# Patient Record
Sex: Female | Born: 1980 | Race: White | Hispanic: No | Smoking: Former smoker
Health system: Southern US, Community
[De-identification: ages and names within clinical notes are randomized; demographics above are authoritative.]

## PROBLEM LIST (undated history)

## (undated) ENCOUNTER — Inpatient Hospital Stay (HOSPITAL_COMMUNITY): Payer: Self-pay

## (undated) DIAGNOSIS — F32A Depression, unspecified: Secondary | ICD-10-CM

## (undated) DIAGNOSIS — E282 Polycystic ovarian syndrome: Secondary | ICD-10-CM

## (undated) DIAGNOSIS — F419 Anxiety disorder, unspecified: Secondary | ICD-10-CM

## (undated) DIAGNOSIS — R9089 Other abnormal findings on diagnostic imaging of central nervous system: Secondary | ICD-10-CM

## (undated) DIAGNOSIS — L409 Psoriasis, unspecified: Secondary | ICD-10-CM

## (undated) DIAGNOSIS — L719 Rosacea, unspecified: Secondary | ICD-10-CM

## (undated) DIAGNOSIS — R569 Unspecified convulsions: Secondary | ICD-10-CM

## (undated) DIAGNOSIS — G43019 Migraine without aura, intractable, without status migrainosus: Secondary | ICD-10-CM

## (undated) DIAGNOSIS — F329 Major depressive disorder, single episode, unspecified: Secondary | ICD-10-CM

## (undated) DIAGNOSIS — M797 Fibromyalgia: Secondary | ICD-10-CM

## (undated) HISTORY — DX: Other abnormal findings on diagnostic imaging of central nervous system: R90.89

## (undated) HISTORY — DX: Fibromyalgia: M79.7

## (undated) HISTORY — PX: TONSILLECTOMY: SUR1361

## (undated) HISTORY — PX: CHOLECYSTECTOMY: SHX55

## (undated) HISTORY — DX: Migraine without aura, intractable, without status migrainosus: G43.019

## (undated) HISTORY — PX: TONSILLECTOMY AND ADENOIDECTOMY: SHX28

---

## 2003-02-19 ENCOUNTER — Emergency Department (HOSPITAL_COMMUNITY): Admission: EM | Admit: 2003-02-19 | Discharge: 2003-02-19 | Payer: Self-pay | Admitting: Emergency Medicine

## 2003-08-10 ENCOUNTER — Emergency Department (HOSPITAL_COMMUNITY): Admission: EM | Admit: 2003-08-10 | Discharge: 2003-08-11 | Payer: Self-pay | Admitting: *Deleted

## 2004-05-22 ENCOUNTER — Emergency Department (HOSPITAL_COMMUNITY): Admission: EM | Admit: 2004-05-22 | Discharge: 2004-05-22 | Payer: Self-pay | Admitting: Emergency Medicine

## 2004-11-05 ENCOUNTER — Emergency Department (HOSPITAL_COMMUNITY): Admission: EM | Admit: 2004-11-05 | Discharge: 2004-11-05 | Payer: Self-pay | Admitting: Emergency Medicine

## 2004-11-25 ENCOUNTER — Emergency Department (HOSPITAL_COMMUNITY): Admission: EM | Admit: 2004-11-25 | Discharge: 2004-11-26 | Payer: Self-pay | Admitting: *Deleted

## 2004-12-01 ENCOUNTER — Emergency Department: Payer: Self-pay | Admitting: Unknown Physician Specialty

## 2005-01-02 ENCOUNTER — Emergency Department: Payer: Self-pay | Admitting: Emergency Medicine

## 2005-07-04 ENCOUNTER — Ambulatory Visit (HOSPITAL_COMMUNITY): Admission: RE | Admit: 2005-07-04 | Discharge: 2005-07-04 | Payer: Self-pay | Admitting: Neurology

## 2006-05-09 ENCOUNTER — Other Ambulatory Visit: Admission: RE | Admit: 2006-05-09 | Discharge: 2006-05-09 | Payer: Self-pay | Admitting: Obstetrics and Gynecology

## 2006-05-23 ENCOUNTER — Inpatient Hospital Stay (HOSPITAL_COMMUNITY): Admission: AD | Admit: 2006-05-23 | Discharge: 2006-05-23 | Payer: Self-pay | Admitting: Obstetrics and Gynecology

## 2006-07-19 ENCOUNTER — Inpatient Hospital Stay (HOSPITAL_COMMUNITY): Admission: AD | Admit: 2006-07-19 | Discharge: 2006-07-19 | Payer: Self-pay | Admitting: Obstetrics and Gynecology

## 2006-08-10 ENCOUNTER — Inpatient Hospital Stay (HOSPITAL_COMMUNITY): Admission: AD | Admit: 2006-08-10 | Discharge: 2006-08-10 | Payer: Self-pay | Admitting: Obstetrics and Gynecology

## 2006-10-06 ENCOUNTER — Inpatient Hospital Stay (HOSPITAL_COMMUNITY): Admission: AD | Admit: 2006-10-06 | Discharge: 2006-10-06 | Payer: Self-pay | Admitting: Obstetrics and Gynecology

## 2007-06-19 ENCOUNTER — Emergency Department: Payer: Self-pay | Admitting: Emergency Medicine

## 2007-12-27 ENCOUNTER — Emergency Department: Payer: Self-pay | Admitting: Emergency Medicine

## 2008-01-22 ENCOUNTER — Emergency Department: Payer: Self-pay | Admitting: Emergency Medicine

## 2008-03-05 ENCOUNTER — Emergency Department: Payer: Self-pay | Admitting: Emergency Medicine

## 2008-12-21 ENCOUNTER — Emergency Department (HOSPITAL_COMMUNITY): Admission: EM | Admit: 2008-12-21 | Discharge: 2008-12-21 | Payer: Self-pay | Admitting: Emergency Medicine

## 2009-03-28 ENCOUNTER — Emergency Department (HOSPITAL_COMMUNITY): Admission: EM | Admit: 2009-03-28 | Discharge: 2009-03-28 | Payer: Self-pay | Admitting: *Deleted

## 2009-07-20 ENCOUNTER — Emergency Department: Payer: Self-pay | Admitting: Emergency Medicine

## 2009-07-31 ENCOUNTER — Emergency Department: Payer: Self-pay | Admitting: Emergency Medicine

## 2009-12-09 ENCOUNTER — Emergency Department: Payer: Self-pay | Admitting: Emergency Medicine

## 2010-04-07 ENCOUNTER — Emergency Department: Payer: Self-pay | Admitting: Emergency Medicine

## 2010-10-21 ENCOUNTER — Emergency Department: Payer: Self-pay | Admitting: Unknown Physician Specialty

## 2010-12-26 ENCOUNTER — Emergency Department: Payer: Self-pay | Admitting: Unknown Physician Specialty

## 2011-01-12 ENCOUNTER — Emergency Department: Payer: Self-pay | Admitting: Emergency Medicine

## 2011-03-13 LAB — POCT I-STAT, CHEM 8
Calcium, Ion: 1.22 mmol/L (ref 1.12–1.32)
Creatinine, Ser: 0.8 mg/dL (ref 0.4–1.2)
Hemoglobin: 13.9 g/dL (ref 12.0–15.0)
TCO2: 26 mmol/L (ref 0–100)

## 2011-03-13 LAB — CBC
HCT: 39.3 % (ref 36.0–46.0)
Hemoglobin: 13.6 g/dL (ref 12.0–15.0)
MCHC: 34.8 g/dL (ref 30.0–36.0)
RBC: 4.57 MIL/uL (ref 3.87–5.11)
RDW: 13.4 % (ref 11.5–15.5)
WBC: 11 10*3/uL — ABNORMAL HIGH (ref 4.0–10.5)

## 2011-03-13 LAB — DIFFERENTIAL
Basophils Relative: 0 % (ref 0–1)
Lymphocytes Relative: 38 % (ref 12–46)
Monocytes Relative: 5 % (ref 3–12)
Neutro Abs: 5.9 10*3/uL (ref 1.7–7.7)

## 2011-03-13 LAB — POCT PREGNANCY, URINE: Preg Test, Ur: NEGATIVE

## 2011-03-18 LAB — RAPID STREP SCREEN (MED CTR MEBANE ONLY): Streptococcus, Group A Screen (Direct): NEGATIVE

## 2011-08-16 ENCOUNTER — Other Ambulatory Visit (HOSPITAL_COMMUNITY): Payer: Self-pay | Admitting: Family Medicine

## 2011-08-16 ENCOUNTER — Ambulatory Visit (HOSPITAL_COMMUNITY)
Admission: RE | Admit: 2011-08-16 | Discharge: 2011-08-16 | Disposition: A | Discharge status: Home / Self Care | Payer: Medicaid Other | Source: Ambulatory Visit | Attending: Family Medicine | Admitting: Family Medicine

## 2011-08-16 DIAGNOSIS — R52 Pain, unspecified: Secondary | ICD-10-CM

## 2011-08-16 DIAGNOSIS — M545 Low back pain, unspecified: Secondary | ICD-10-CM | POA: Insufficient documentation

## 2011-08-16 DIAGNOSIS — M79609 Pain in unspecified limb: Secondary | ICD-10-CM | POA: Insufficient documentation

## 2011-09-27 ENCOUNTER — Ambulatory Visit: Payer: Self-pay | Admitting: Internal Medicine

## 2011-12-05 ENCOUNTER — Encounter: Payer: Self-pay | Admitting: Emergency Medicine

## 2011-12-05 ENCOUNTER — Emergency Department (HOSPITAL_COMMUNITY)
Admission: EM | Admit: 2011-12-05 | Discharge: 2011-12-05 | Disposition: A | Discharge status: Home / Self Care | Payer: Medicaid Other | Source: Home / Self Care | Attending: Family Medicine | Admitting: Family Medicine

## 2011-12-05 DIAGNOSIS — N760 Acute vaginitis: Secondary | ICD-10-CM

## 2011-12-05 HISTORY — DX: Polycystic ovarian syndrome: E28.2

## 2011-12-05 MED ORDER — FLUCONAZOLE 150 MG PO TABS: 150.0000 mg | ORAL_TABLET | Freq: Once | ORAL | Status: AC

## 2011-12-05 MED ORDER — NYSTATIN-TRIAMCINOLONE 100000-0.1 UNIT/GM-% EX CREA: TOPICAL_CREAM | CUTANEOUS | Status: DC

## 2011-12-05 NOTE — ED Notes (Signed)
Patient reports going to dermatologist tomorrow, encouraged speaking to dermatologist about these skin issues

## 2011-12-05 NOTE — ED Provider Notes (Signed)
History     CSN: [id number]  Arrival date & time 12/05/11  0906   First MD Initiated Contact with Patient 12/05/11 [phone]      Chief Complaint  Patient presents with  . Rash    (Consider location/radiation/quality/duration/timing/severity/associated sxs/prior treatment) HPI Comments: Andrea [name] presents for evaluation of irritated skin over her vagina and genital area. She reports using a new brand of feminine wash recently that was perfumed. She reports normally sensitive skin, all over. She reports itching, swelling, pain, and redness over her vagina, perineum. She has used several over the counter preparations, except steroids, without relief.   Patient is a 31 y.o. female presenting with vaginal itching. The history is provided by the patient.  Vaginal Itching This is a new problem. The current episode started more than 1 week ago. The problem occurs constantly. The problem has been gradually worsening. The symptoms are aggravated by nothing. The symptoms are relieved by nothing. The treatment provided no relief.    Past Medical History  Diagnosis Date  . Polycystic ovarian disease     Past Surgical History  Procedure Date  . Tonsillectomy   . Tonsillectomy and adenoidectomy   . Cholecystectomy     No family history on file.  History  Substance Use Topics  . Smoking status: Never Smoker   . Smokeless tobacco: Not on file  . Alcohol Use: No    OB History    Grav Para Term Preterm Abortions TAB SAB Ect Mult Living                  Review of Systems  Constitutional: Negative.   HENT: Negative.   Eyes: Negative.   Respiratory: Negative.   Cardiovascular: Negative.   Gastrointestinal: Negative.   Genitourinary: Positive for vaginal pain. Negative for dysuria, vaginal bleeding and vaginal discharge.  Musculoskeletal: Negative.   Skin: Positive for rash.  Neurological: Negative.     Allergies  Flagyl  Home Medications   Current Outpatient Rx  Name Route Sig  Dispense Refill  . BENZOCAINE-RESORCINOL 5-2 % VA CREA Vaginal Place vaginally at bedtime.      Marland Kitchen BIOTIN PO Oral Take by mouth.      Maryfrances Bunnell EX Apply externally Apply topically.      Marland Kitchen METFORMIN HCL 1000 MG PO TABS Oral Take 1,000 mg by mouth 2 (two) times daily with a meal.      . NYSTATIN 100000 UNIT/GM EX CREA Topical Apply topically 2 (two) times daily.      Marland Kitchen SPIRONOLACTONE 100 MG PO TABS Oral Take 100 mg by mouth 2 (two) times daily.      Marland Kitchen FLUCONAZOLE 150 MG PO TABS Oral Take 1 tablet (150 mg total) by mouth once. Take one pill once. May repeat if symptoms persist after 3rd day. 2 tablet 0  . NYSTATIN-TRIAMCINOLONE 100000-0.1 UNIT/GM-% EX CREA  Apply to affected area daily 60 g 0    BP 125/58  Pulse 87  Temp(Src) 98.2 F (36.8 C) (Oral)  Resp 20  SpO2 100%  LMP 12/05/2011  Physical Exam  Nursing note and vitals reviewed. Constitutional: She is oriented to person, place, and time. She appears well-developed and well-nourished.  HENT:  Head: Normocephalic and atraumatic.  Eyes: EOM are normal.  Neck: Normal range of motion.  Pulmonary/Chest: Effort normal.  Genitourinary: Vagina normal. Pelvic exam was performed with patient supine. There is rash and tenderness on the right labia. There is rash and tenderness on the left labia.  Erythema, swelling, tenderness to palpation over vaginal labia, inguinal region, and perineum  Musculoskeletal: Normal range of motion.  Neurological: She is alert and oriented to person, place, and time.  Skin: Skin is warm and dry.  Psychiatric: Her behavior is normal.    ED Course  Procedures (including critical care time)  Labs Reviewed - No data to display No results found.   1. Vaginitis and vulvovaginitis       MDM  Treated with Mycolog II cream.        Richardo Priest, MD 12/05/11 1054

## 2011-12-05 NOTE — ED Notes (Signed)
C/o red, irritated vaginal, labia area.  Changed soaps recently, 2 weeks ago.  Symptoms started one week ago

## 2011-12-06 ENCOUNTER — Telehealth (HOSPITAL_COMMUNITY): Payer: Self-pay | Admitting: *Deleted

## 2011-12-06 NOTE — ED Notes (Signed)
[facility] pharmacist called on VM @ [phone] with question about the patients Rx. I called back and spoke to [facility].  She said pt. has an allergy to Metronidazole that causes swelling to her body and face. She said it is in the same family as Fluconazole and asked what Dr. Juanetta Gosling wanted to change it to? I told her I would call back.  Discussed with Dr. Juanetta Gosling and he said he had never heard of this but would research it. He said tell the pharmacist to cancel the Fluconazole Rx. and have pt. just use the topical Mycalog Cream. 1300  I called the pharmacist Melissa back and gave her this order to cancel.  She said she will tell the pt. Andrea [name] 12/06/2011  12/06/2011

## 2012-07-11 ENCOUNTER — Encounter (HOSPITAL_COMMUNITY): Payer: Self-pay

## 2012-07-11 ENCOUNTER — Emergency Department (HOSPITAL_COMMUNITY)
Admission: EM | Admit: 2012-07-11 | Discharge: 2012-07-11 | Disposition: A | Discharge status: Home / Self Care | Payer: Self-pay | Attending: Emergency Medicine | Admitting: Emergency Medicine

## 2012-07-11 DIAGNOSIS — E282 Polycystic ovarian syndrome: Secondary | ICD-10-CM | POA: Insufficient documentation

## 2012-07-11 DIAGNOSIS — N390 Urinary tract infection, site not specified: Secondary | ICD-10-CM | POA: Insufficient documentation

## 2012-07-11 LAB — URINALYSIS, ROUTINE W REFLEX MICROSCOPIC
Bilirubin Urine: NEGATIVE
Ketones, ur: NEGATIVE mg/dL
Specific Gravity, Urine: 1.026 (ref 1.005–1.030)

## 2012-07-11 LAB — POCT I-STAT, CHEM 8
Calcium, Ion: 1.23 mmol/L (ref 1.12–1.23)
Chloride: 103 mEq/L (ref 96–112)
Creatinine, Ser: 0.8 mg/dL (ref 0.50–1.10)
Glucose, Bld: 96 mg/dL (ref 70–99)
HCT: 41 % (ref 36.0–46.0)

## 2012-07-11 LAB — PREGNANCY, URINE: Preg Test, Ur: NEGATIVE

## 2012-07-11 LAB — URINE MICROSCOPIC-ADD ON

## 2012-07-11 LAB — WET PREP, GENITAL
Trich, Wet Prep: NONE SEEN
Yeast Wet Prep HPF POC: NONE SEEN

## 2012-07-11 MED ORDER — CEPHALEXIN 500 MG PO CAPS: 500.0000 mg | ORAL_CAPSULE | Freq: Two times a day (BID) | ORAL | Status: AC

## 2012-07-11 NOTE — ED Notes (Signed)
Pt with c/o 4 days of pelvic pain, constant sharp pain, just finished period, straining to void

## 2012-07-11 NOTE — ED Provider Notes (Signed)
History     CSN: [id number]  Arrival date & time 07/11/12  0902   First MD Initiated Contact with Patient 07/11/12 [phone]      Chief Complaint  Patient presents with  . Pelvic Pain    (Consider location/radiation/quality/duration/timing/severity/associated sxs/prior treatment) Patient is a 31 y.o. female presenting with pelvic pain.  Pelvic Pain Pertinent negatives include no fever or rash.    ROZELL KETTLEWELL is a 31 y.o. female in no acute distress complaining of pelvic pain worsening over the course of 4 days patient describes the pain as sharp 4/10 and worsened with urination. Patient denies any vaginal discharge, fever, nausea,vomiting, dysuria or frequency or rash. Episode is consistent with prior exacerbations of urinary tract infections.    Past Medical History  Diagnosis Date  . Polycystic ovarian disease     Past Surgical History  Procedure Date  . Tonsillectomy   . Tonsillectomy and adenoidectomy   . Cholecystectomy     History reviewed. No pertinent family history.  History  Substance Use Topics  . Smoking status: Never Smoker   . Smokeless tobacco: Not on file  . Alcohol Use: No    OB History    Grav Para Term Preterm Abortions TAB SAB Ect Mult Living                  Review of Systems  Constitutional: Negative for fever.  Genitourinary: Positive for vaginal pain and pelvic pain. Negative for dysuria, urgency, frequency, flank pain and vaginal discharge.  Skin: Negative for rash.  All other systems reviewed and are negative.    Allergies  Flagyl  Home Medications   Current Outpatient Rx  Name Route Sig Dispense Refill  . ACETAMINOPHEN 500 MG PO TABS Oral Take 500 mg by mouth every 6 (six) hours as needed. For pain    . IBUPROFEN 200 MG PO TABS Oral Take 200 mg by mouth every 6 (six) hours as needed. For pain    . METFORMIN HCL 1000 MG PO TABS Oral Take 1,000 mg by mouth 2 (two) times daily with a meal.      . SPIRONOLACTONE 100 MG PO  TABS Oral Take 100 mg by mouth 2 (two) times daily.      . CEPHALEXIN 500 MG PO CAPS Oral Take 1 capsule (500 mg total) by mouth 2 (two) times daily. 20 capsule 0    BP 118/63  Pulse 82  Temp 98.1 F (36.7 C) (Oral)  Resp 16  SpO2 99%  LMP 06/15/2012  Physical Exam  Nursing note and vitals reviewed. Constitutional: She is oriented to person, place, and time. She appears well-developed and well-nourished. No distress.  HENT:  Head: Normocephalic.  Eyes: Conjunctivae and EOM are normal.  Cardiovascular: Normal rate.   Pulmonary/Chest: Effort normal.  Abdominal: Soft. Bowel sounds are normal. She exhibits no distension and no mass. There is no tenderness. There is no rebound and no guarding.  Genitourinary: Vagina normal. Uterus is not enlarged. Cervix exhibits discharge. Cervix exhibits no motion tenderness and no friability. Right adnexum displays no mass, no tenderness and no fullness. Left adnexum displays no mass, no tenderness and no fullness. No vaginal discharge found.       Chaperone present during pelvic exam. No cervical motion tenderness. Thick white cervical discharge.   Musculoskeletal: Normal range of motion.  Neurological: She is alert and oriented to person, place, and time.  Psychiatric: She has a normal mood and affect.    ED Course  Procedures (including critical care time)  Labs Reviewed  URINALYSIS, ROUTINE W REFLEX MICROSCOPIC - Abnormal; Notable for the following:    Leukocytes, UA SMALL (*)     All other components within normal limits  URINE MICROSCOPIC-ADD ON - Abnormal; Notable for the following:    Squamous Epithelial / LPF FEW (*)     All other components within normal limits  PREGNANCY, URINE  POCT PREGNANCY, URINE  POCT I-STAT, CHEM 8  GC/CHLAMYDIA PROBE AMP, GENITAL  WET PREP, GENITAL   No results found.   1. UTI (lower urinary tract infection)       MDM  31 year old female with pelvic pain consistent with prior episodes of UTI. UA  shows sites I will treat with Keflex and culture her urine.        Wynetta Emery, PA-C 07/11/12 1037

## 2012-07-12 NOTE — ED Provider Notes (Signed)
Medical screening examination/treatment/procedure(s) were performed by non-physician practitioner and as supervising physician I was immediately available for consultation/collaboration.  Madai Nuccio, MD 07/12/12 1724 

## 2013-05-18 ENCOUNTER — Emergency Department: Payer: Self-pay | Admitting: Emergency Medicine

## 2013-12-02 ENCOUNTER — Emergency Department (HOSPITAL_COMMUNITY)
Admission: EM | Admit: 2013-12-02 | Discharge: 2013-12-02 | Disposition: A | Discharge status: Home / Self Care | Payer: Medicaid Other | Attending: Emergency Medicine | Admitting: Emergency Medicine

## 2013-12-02 ENCOUNTER — Encounter (HOSPITAL_COMMUNITY): Payer: Self-pay | Admitting: Emergency Medicine

## 2013-12-02 DIAGNOSIS — Z79899 Other long term (current) drug therapy: Secondary | ICD-10-CM | POA: Insufficient documentation

## 2013-12-02 DIAGNOSIS — N760 Acute vaginitis: Secondary | ICD-10-CM | POA: Insufficient documentation

## 2013-12-02 LAB — WET PREP, GENITAL
CLUE CELLS WET PREP: NONE SEEN
TRICH WET PREP: NONE SEEN
Yeast Wet Prep HPF POC: NONE SEEN

## 2013-12-02 MED ORDER — FLUCONAZOLE 100 MG PO TABS
150.0000 mg | ORAL_TABLET | Freq: Once | ORAL | Status: DC
  Filled 2013-12-02: qty 2

## 2013-12-02 MED ORDER — FLUCONAZOLE 100 MG PO TABS
200.0000 mg | ORAL_TABLET | Freq: Once | ORAL | Status: AC
  Administered 2013-12-02: 200 mg via ORAL

## 2013-12-02 NOTE — Discharge Instructions (Signed)
You were treated in the emergency department with a tablet of Diflucan for what I believe to be a yeast-type infection. You may also use one of the Vagisil anti-itch preparations, or Vaseline petroleum jelly to assist with your discomfort. Please return to the emergency department, or see your primary physician if not improving. Your cultures were submitted to the lab, and someone from our flow managers office will contact you if any abnormalities present. Vaginitis Vaginitis is an inflammation of the vagina. It is most often caused by a change in the normal balance of the bacteria and yeast that live in the vagina. This change in balance causes an overgrowth of certain bacteria or yeast, which causes the inflammation. There are different types of vaginitis, but the most common types are:  Bacterial vaginosis.  Yeast infection (candidiasis).  Trichomoniasis vaginitis. This is a sexually transmitted infection (STI).  Viral vaginitis.  Atropic vaginitis.  Allergic vaginitis. CAUSES  The cause depends on the type of vaginitis. Vaginitis can be caused by:  Bacteria (bacterial vaginosis).  Yeast (yeast infection).  A parasite (trichomoniasis vaginitis)  A virus (viral vaginitis).  Low hormone levels (atrophic vaginitis). Low hormone levels can occur during pregnancy, breastfeeding, or after menopause.  Irritants, such as bubble baths, scented tampons, and feminine sprays (allergic vaginitis). Other factors can change the normal balance of the yeast and bacteria that live in the vagina. These include:  Antibiotic medicines.  Poor hygiene.  Diaphragms, vaginal sponges, spermicides, birth control pills, and intrauterine devices (IUD).  Sexual intercourse.  Infection.  Uncontrolled diabetes.  A weakened immune system. SYMPTOMS  Symptoms can vary depending on the cause of the vaginitis. Common symptoms include:  Abnormal vaginal discharge.  The discharge is white, gray, or  yellow with bacterial vaginosis.  The discharge is thick, white, and cheesy with a yeast infection.  The discharge is frothy and yellow or greenish with trichomoniasis.  A bad vaginal odor.  The odor is fishy with bacterial vaginosis.  Vaginal itching, pain, or swelling.  Painful intercourse.  Pain or burning when urinating. Sometimes, there are no symptoms. TREATMENT  Treatment will vary depending on the type of infection.   Bacterial vaginosis and trichomoniasis are often treated with antibiotic creams or pills.  Yeast infections are often treated with antifungal medicines, such as vaginal creams or suppositories.  Viral vaginitis has no cure, but symptoms can be treated with medicines that relieve discomfort. Your sexual partner should be treated as well.  Atrophic vaginitis may be treated with an estrogen cream, pill, suppository, or vaginal ring. If vaginal dryness occurs, lubricants and moisturizing creams may help. You may be told to avoid scented soaps, sprays, or douches.  Allergic vaginitis treatment involves quitting the use of the product that is causing the problem. Vaginal creams can be used to treat the symptoms. HOME CARE INSTRUCTIONS   Take all medicines as directed by your caregiver.  Keep your genital area clean and dry. Avoid soap and only rinse the area with water.  Avoid douching. It can remove the healthy bacteria in the vagina.  Do not use tampons or have sexual intercourse until your vaginitis has been treated. Use sanitary pads while you have vaginitis.  Wipe from front to back. This avoids the spread of bacteria from the rectum to the vagina.  Let air reach your genital area.  Wear cotton underwear to decrease moisture buildup.  Avoid wearing underwear while you sleep until your vaginitis is gone.  Avoid tight pants and underwear or  nylons without a cotton panel.  Take off wet clothing (especially bathing suits) as soon as possible.  Use  mild, non-scented products. Avoid using irritants, such as:  Scented feminine sprays.  Fabric softeners.  Scented detergents.  Scented tampons.  Scented soaps or bubble baths.  Practice safe sex and use condoms. Condoms may prevent the spread of trichomoniasis and viral vaginitis. SEEK MEDICAL CARE IF:   You have abdominal pain.  You have a fever or persistent symptoms for more than 2 3 days.  You have a fever and your symptoms suddenly get worse. Document Released: 09/15/2007 Document Revised: 08/12/2012 Document Reviewed: 04/30/2012 [facility] Patient Information 2014 [place], [place].

## 2013-12-02 NOTE — ED Notes (Signed)
Patient complaining of vaginal itching and swelling starting today. States "My husband just left me for someone else and I want to be checked for everything." Patient has history of yeast infections.

## 2013-12-02 NOTE — ED Provider Notes (Signed)
CSN: [id number]     Arrival date & time 12/02/13  1537 History   First MD Initiated Contact with Patient 12/02/13 1609     Chief Complaint  Patient presents with  . Vaginal Itching   (Consider location/radiation/quality/duration/timing/severity/associated sxs/prior Treatment) Patient is a 33 y.o. female presenting with vaginal itching. The history is provided by the patient.  Vaginal Itching This is a new problem. The current episode started in the past 7 days. The problem occurs intermittently. The problem has been gradually worsening. Pertinent negatives include no abdominal pain, arthralgias, chest pain, coughing, neck pain, swollen glands or vomiting. Nothing aggravates the symptoms. She has tried nothing for the symptoms. The treatment provided moderate relief.    Past Medical History  Diagnosis Date  . Polycystic ovarian disease    Past Surgical History  Procedure Laterality Date  . Tonsillectomy    . Tonsillectomy and adenoidectomy    . Cholecystectomy     History reviewed. No pertinent family history. History  Substance Use Topics  . Smoking status: Never Smoker   . Smokeless tobacco: Not on file  . Alcohol Use: Yes     Comment: occasionally   OB History   Grav Para Term Preterm Abortions TAB SAB Ect Mult Living                 Review of Systems  Constitutional: Negative for activity change.       All ROS Neg except as noted in HPI  HENT: Negative for nosebleeds.   Eyes: Negative for photophobia and discharge.  Respiratory: Negative for cough, shortness of breath and wheezing.   Cardiovascular: Negative for chest pain and palpitations.  Gastrointestinal: Negative for vomiting, abdominal pain and blood in stool.  Genitourinary: Negative for dysuria, frequency and hematuria.  Musculoskeletal: Negative for arthralgias, back pain and neck pain.  Skin: Negative.   Neurological: Negative for dizziness, seizures and speech difficulty.  Psychiatric/Behavioral: Negative  for hallucinations and confusion.    Allergies  Flagyl  Home Medications   Current Outpatient Rx  Name  Route  Sig  Dispense  Refill  . acetaminophen (TYLENOL) 500 MG tablet   Oral   Take 500 mg by mouth every 6 (six) hours as needed. For pain         . ibuprofen (ADVIL,MOTRIN) 200 MG tablet   Oral   Take 200 mg by mouth every 6 (six) hours as needed. For pain         . metFORMIN (GLUCOPHAGE) 1000 MG tablet   Oral   Take 1,000 mg by mouth 2 (two) times daily with a meal.           . spironolactone (ALDACTONE) 100 MG tablet   Oral   Take 100 mg by mouth 2 (two) times daily.            BP 134/84  Pulse 80  Temp(Src) 98.2 F (36.8 C) (Oral)  Resp 16  Ht 5\' 7"  (1.702 m)  Wt 263 lb (119.296 kg)  BMI 41.18 kg/m2  SpO2 100%  LMP 10/21/2013 Physical Exam  Nursing note and vitals reviewed. Constitutional: She is oriented to person, place, and time. She appears well-developed and well-nourished.  Non-toxic appearance.  HENT:  Head: Normocephalic.  Right Ear: Tympanic membrane and external ear normal.  Left Ear: Tympanic membrane and external ear normal.  Eyes: EOM and lids are normal. Pupils are equal, round, and reactive to light.  Neck: Normal range of motion. Neck supple. Carotid bruit is  not present.  Cardiovascular: Normal rate, regular rhythm, normal heart sounds, intact distal pulses and normal pulses.   Pulmonary/Chest: Breath sounds normal. No respiratory distress.  Abdominal: Soft. Bowel sounds are normal. There is no tenderness. There is no guarding. Hernia confirmed negative in the right inguinal area and confirmed negative in the left inguinal area.  Genitourinary: There is tenderness on the right labia. There is no lesion or injury on the right labia. There is tenderness on the left labia. There is no lesion or injury on the left labia. There is erythema around the vagina. No bleeding around the vagina. No foreign body around the vagina. No signs of injury  around the vagina. Vaginal discharge found.  Mild to mod white vaginal discharge. Increase redness and swelling of the internal and external labia.  Musculoskeletal: Normal range of motion.  Lymphadenopathy:       Head (right side): No submandibular adenopathy present.       Head (left side): No submandibular adenopathy present.    She has no cervical adenopathy.       Right: No inguinal adenopathy present.       Left: No inguinal adenopathy present.  Neurological: She is alert and oriented to person, place, and time. She has normal strength. No cranial nerve deficit or sensory deficit.  Skin: Skin is warm and dry.  Psychiatric: She has a normal mood and affect. Her speech is normal.    ED Course  Procedures (including critical care time) Labs Review Labs Reviewed - No data to display Imaging Review No results found.  EKG Interpretation   None       MDM  No diagnosis found. **I have reviewed nursing notes, vital signs, and all appropriate lab and imaging results for this patient.* RPR and gonorrhea Chlamydia tests have been obtained and sent to the lab.  Wet prep reveals no yeast, trichomonas, or clue cells present.  Patient treated with oral Diflucan for itching symptoms and findings on examination. Patient invited to try Vagisil anti-itch products, or Vaseline petroleum jelly for assistance with the dryness and itching. Patient is to see her primary physician, or return to the emergency department if not improving.  Kathie Dike, PA-C 12/02/13 1744

## 2013-12-03 LAB — RPR: RPR Ser Ql: NONREACTIVE

## 2013-12-05 LAB — GC/CHLAMYDIA PROBE AMP
CT Probe RNA: NEGATIVE
GC Probe RNA: NEGATIVE

## 2013-12-06 ENCOUNTER — Emergency Department: Payer: Self-pay | Admitting: Emergency Medicine

## 2013-12-06 LAB — URINALYSIS, COMPLETE
BACTERIA: NONE SEEN
Bilirubin,UR: NEGATIVE
Blood: NEGATIVE
GLUCOSE, UR: NEGATIVE mg/dL (ref 0–75)
Ketone: NEGATIVE
LEUKOCYTE ESTERASE: NEGATIVE
Nitrite: NEGATIVE
PROTEIN: NEGATIVE
Ph: 7 (ref 4.5–8.0)
SPECIFIC GRAVITY: 1.015 (ref 1.003–1.030)
Squamous Epithelial: 3

## 2013-12-06 LAB — GC/CHLAMYDIA PROBE AMP

## 2013-12-06 LAB — WET PREP, GENITAL

## 2013-12-07 NOTE — ED Provider Notes (Signed)
History/physical exam/procedure(s) were performed by non-physician practitioner and as supervising physician I was immediately available for consultation/collaboration. I have reviewed all notes and am in agreement with care and plan.   Challis Crill S Keidan Aumiller, MD 12/07/13 1511 

## 2014-04-29 ENCOUNTER — Emergency Department: Payer: Self-pay | Admitting: Emergency Medicine

## 2014-04-29 LAB — URINALYSIS, COMPLETE
BACTERIA: NONE SEEN
BILIRUBIN, UR: NEGATIVE
BLOOD: NEGATIVE
GLUCOSE, UR: NEGATIVE mg/dL (ref 0–75)
Leukocyte Esterase: NEGATIVE
NITRITE: NEGATIVE
PROTEIN: NEGATIVE
Ph: 5 (ref 4.5–8.0)
RBC,UR: 1 /HPF (ref 0–5)
SPECIFIC GRAVITY: 1.029 (ref 1.003–1.030)
Squamous Epithelial: <1

## 2014-05-10 ENCOUNTER — Ambulatory Visit: Payer: Self-pay | Admitting: Physician Assistant

## 2014-06-06 ENCOUNTER — Ambulatory Visit: Payer: Self-pay | Admitting: Internal Medicine

## 2015-01-09 ENCOUNTER — Ambulatory Visit: Payer: Self-pay

## 2015-03-14 ENCOUNTER — Emergency Department: Admit: 2015-03-14 | Disposition: A | Discharge status: Home / Self Care | Payer: Self-pay | Admitting: Student

## 2015-03-18 ENCOUNTER — Ambulatory Visit
Admit: 2015-03-18 | Disposition: A | Discharge status: Home / Self Care | Payer: Self-pay | Attending: Orthopedic Surgery | Admitting: Orthopedic Surgery

## 2015-03-18 ENCOUNTER — Ambulatory Visit: Admit: 2015-03-18 | Disposition: A | Discharge status: Home / Self Care | Payer: Self-pay

## 2015-04-26 ENCOUNTER — Ambulatory Visit: Payer: Medicaid Other | Admitting: Anesthesiology

## 2015-10-04 ENCOUNTER — Emergency Department: Payer: Medicaid Other

## 2015-10-04 ENCOUNTER — Emergency Department
Admission: EM | Admit: 2015-10-04 | Discharge: 2015-10-04 | Disposition: A | Discharge status: Home / Self Care | Payer: Medicaid Other | Attending: Emergency Medicine | Admitting: Emergency Medicine

## 2015-10-04 ENCOUNTER — Encounter: Payer: Self-pay | Admitting: Intensive Care

## 2015-10-04 DIAGNOSIS — Z87891 Personal history of nicotine dependence: Secondary | ICD-10-CM | POA: Insufficient documentation

## 2015-10-04 DIAGNOSIS — Z791 Long term (current) use of non-steroidal anti-inflammatories (NSAID): Secondary | ICD-10-CM | POA: Insufficient documentation

## 2015-10-04 DIAGNOSIS — Z331 Pregnant state, incidental: Secondary | ICD-10-CM

## 2015-10-04 DIAGNOSIS — Z3A01 Less than 8 weeks gestation of pregnancy: Secondary | ICD-10-CM | POA: Diagnosis not present

## 2015-10-04 DIAGNOSIS — O210 Mild hyperemesis gravidarum: Secondary | ICD-10-CM | POA: Insufficient documentation

## 2015-10-04 DIAGNOSIS — Z3490 Encounter for supervision of normal pregnancy, unspecified, unspecified trimester: Secondary | ICD-10-CM

## 2015-10-04 DIAGNOSIS — Z79899 Other long term (current) drug therapy: Secondary | ICD-10-CM | POA: Insufficient documentation

## 2015-10-04 DIAGNOSIS — R112 Nausea with vomiting, unspecified: Secondary | ICD-10-CM

## 2015-10-04 LAB — COMPREHENSIVE METABOLIC PANEL
ALT: 16 U/L (ref 14–54)
AST: 19 U/L (ref 15–41)
Albumin: 4.5 g/dL (ref 3.5–5.0)
Alkaline Phosphatase: 63 U/L (ref 38–126)
Anion gap: 10 (ref 5–15)
BUN: 15 mg/dL (ref 6–20)
CO2: 25 mmol/L (ref 22–32)
Calcium: 9.6 mg/dL (ref 8.9–10.3)
Chloride: 104 mmol/L (ref 101–111)
Creatinine, Ser: 0.67 mg/dL (ref 0.44–1.00)
GFR calc Af Amer: >60 mL/min (ref >60)
GFR calc non Af Amer: >60 mL/min (ref >60)
Glucose, Bld: 91 mg/dL (ref 65–99)
Potassium: 3.8 mmol/L (ref 3.5–5.1)
Sodium: 139 mmol/L (ref 135–145)
Total Bilirubin: 0.7 mg/dL (ref 0.3–1.2)
Total Protein: 7.3 g/dL (ref 6.5–8.1)

## 2015-10-04 LAB — URINALYSIS COMPLETE WITH MICROSCOPIC (ARMC ONLY)
BACTERIA UA: NONE SEEN
Bilirubin Urine: NEGATIVE
Glucose, UA: NEGATIVE mg/dL
Hgb urine dipstick: NEGATIVE
Leukocytes, UA: NEGATIVE
Nitrite: NEGATIVE
PH: 6 (ref 5.0–8.0)
PROTEIN: NEGATIVE mg/dL
RBC / HPF: NONE SEEN RBC/hpf (ref 0–5)
Specific Gravity, Urine: 1.025 (ref 1.005–1.030)

## 2015-10-04 LAB — POCT PREGNANCY, URINE: Preg Test, Ur: POSITIVE — AB

## 2015-10-04 LAB — CBC
HCT: 43.7 % (ref 35.0–47.0)
Hemoglobin: 14.9 g/dL (ref 12.0–16.0)
MCH: 29.8 pg (ref 26.0–34.0)
MCHC: 34 g/dL (ref 32.0–36.0)
MCV: 87.6 fL (ref 80.0–100.0)
Platelets: 225 10*3/uL (ref 150–440)
RBC: 4.99 MIL/uL (ref 3.80–5.20)
RDW: 12.6 % (ref 11.5–14.5)
WBC: 8.7 10*3/uL (ref 3.6–11.0)

## 2015-10-04 LAB — HCG, QUANTITATIVE, PREGNANCY: hCG, Beta Chain, Quant, S: 51599 m[IU]/mL — ABNORMAL HIGH (ref <5)

## 2015-10-04 LAB — WET PREP, GENITAL
Clue Cells Wet Prep HPF POC: NONE SEEN
Trich, Wet Prep: NONE SEEN
Yeast Wet Prep HPF POC: NONE SEEN

## 2015-10-04 LAB — CHLAMYDIA/NGC RT PCR (ARMC ONLY)
Chlamydia Tr: NOT DETECTED
N GONORRHOEAE: NOT DETECTED

## 2015-10-04 LAB — LIPASE, BLOOD: Lipase: 26 U/L (ref 11–51)

## 2015-10-04 MED ORDER — ONDANSETRON HCL 4 MG/2ML IJ SOLN
4.0000 mg | Freq: Once | INTRAMUSCULAR | Status: AC
  Administered 2015-10-04: 4 mg via INTRAVENOUS

## 2015-10-04 MED ORDER — PROMETHAZINE HCL 25 MG/ML IJ SOLN: 25.0000 mg | Freq: Once | INTRAMUSCULAR | Status: DC

## 2015-10-04 MED ORDER — ONDANSETRON HCL 4 MG/2ML IJ SOLN
INTRAMUSCULAR | Status: AC
  Administered 2015-10-04: 4 mg via INTRAVENOUS
  Filled 2015-10-04: qty 2

## 2015-10-04 MED ORDER — SODIUM CHLORIDE 0.9 % IV BOLUS (SEPSIS): 1000.0000 mL | Freq: Once | INTRAVENOUS | Status: DC

## 2015-10-04 MED ORDER — ONDANSETRON HCL 4 MG PO TABS: 4.0000 mg | ORAL_TABLET | Freq: Every day | ORAL | Status: DC | PRN

## 2015-10-04 MED ORDER — ONDANSETRON HCL 4 MG/2ML IJ SOLN
4.0000 mg | Freq: Once | INTRAMUSCULAR | Status: AC
  Administered 2015-10-04: 4 mg via INTRAVENOUS
  Filled 2015-10-04: qty 2

## 2015-10-04 MED ORDER — SODIUM CHLORIDE 0.9 % IV BOLUS (SEPSIS)
1000.0000 mL | Freq: Once | INTRAVENOUS | Status: AC
  Administered 2015-10-04: 1000 mL via INTRAVENOUS

## 2015-10-04 NOTE — ED Notes (Signed)
Pt states she ate "greasy [employer]ili [employer] Donz[employer]ung[employer]ries" Saturday night and has been able to eat or keep anything down since. Has been vomiting continuously and now dry heaving.

## 2015-10-04 NOTE — ED Notes (Signed)
PAtient presents to ER with c/o emesis since Saturday and some diarrhea. Patient reports everytime she tries eating or drinking she vomits since Saturday. Patient denies pain just constantly nauseas.

## 2015-10-04 NOTE — Discharge Instructions (Signed)
Your pregnancy hormone was [id number]. The ultrasound showed your 7 weeks, 0 days pregnant. Take Zofran if needed for nausea. We discussed the risk and benefits of this and other antinausea medicines. Follow-up with OB. You may call [facility] [place] follow[place]. Return to the emergency department if you have uncontrolled nausea vomiting, if you have abdominal pain, fever other urgent concerns.   First Trimester of Pregnancy The first trimester of pregnancy is from week 1 until the end of week 12 (months 1 through 3). During this time, your baby will begin to develop inside you. At 6-8 weeks, the eyes and face are formed, and the heartbeat can be seen on ultrasound. At the end of 12 weeks, all the baby's organs are formed. Prenatal care is all the medical care you receive before the birth of your baby. Make sure you get good prenatal care and follow all of your doctor's instructions. HOME CARE  Medicines  Take medicine only as told by your doctor. Some medicines are safe and some are not during pregnancy.  Take your prenatal vitamins as told by your doctor.  Take medicine that helps you poop (stool softener) as needed if your doctor says it is okay. Diet  Eat regular, healthy meals.  Your doctor will tell you the amount of weight gain that is right for you.  Avoid raw meat and uncooked cheese.  If you feel sick to your stomach (nauseous) or throw up (vomit):  Eat 4 or 5 small meals a day instead of 3 large meals.  Try eating a few soda crackers.  Drink liquids between meals instead of during meals.  If you have a hard time pooping (constipation):  Eat high-fiber foods like fresh vegetables, fruit, and whole grains.  Drink enough fluids to keep your pee (urine) clear or pale yellow. Activity and Exercise  Exercise only as told by your doctor. Stop exercising if you have cramps or pain in your lower belly (abdomen) or low back.  Try to avoid standing for  long periods of time. Move your legs often if you must stand in one place for a long time.  Avoid heavy lifting.  Wear low-heeled shoes. Sit and stand up straight.  You can have sex unless your doctor tells you not to. Relief of Pain or Discomfort  Wear a good support bra if your breasts are sore.  Take warm water baths (sitz baths) to soothe pain or discomfort caused by hemorrhoids. Use hemorrhoid cream if your doctor says it is okay.  Rest with your legs raised if you have leg cramps or low back pain.  Wear support hose if you have puffy, bulging veins (varicose veins) in your legs. Raise (elevate) your feet for 15 minutes, 3-4 times a day. Limit salt in your diet. Prenatal Care  Schedule your prenatal visits by the twelfth week of pregnancy.  Write down your questions. Take them to your prenatal visits.  Keep all your prenatal visits as told by your doctor. Safety  Wear your seat belt at all times when driving.  Make a list of emergency phone numbers. The list should include numbers for family, friends, the hospital, and police and fire departments. General Tips  Ask your doctor for a referral to a local prenatal class. Begin classes no later than at the start of month 6 of your pregnancy.  Ask for help if you need counseling or help with nutrition. Your doctor can give you advice or tell you where  to go for help.  Do not use hot tubs, steam rooms, or saunas.  Do not douche or use tampons or scented sanitary pads.  Do not cross your legs for long periods of time.  Avoid litter boxes and soil used by cats.  Avoid all smoking, herbs, and alcohol. Avoid drugs not approved by your doctor.  Do not use any tobacco products, including cigarettes, chewing tobacco, and electronic cigarettes. If you need help quitting, ask your doctor. You may get counseling or other support to help you quit.  Visit your dentist. At home, brush your teeth with a soft toothbrush. Be gentle when  you floss. GET HELP IF:  You are dizzy.  You have mild cramps or pressure in your lower belly.  You have a nagging pain in your belly area.  You continue to feel sick to your stomach, throw up, or have watery poop (diarrhea).  You have a bad smelling fluid coming from your vagina.  You have pain with peeing (urination).  You have increased puffiness (swelling) in your face, hands, legs, or ankles. GET HELP RIGHT AWAY IF:   You have a fever.  You are leaking fluid from your vagina.  You have spotting or bleeding from your vagina.  You have very bad belly cramping or pain.  You gain or lose weight rapidly.  You throw up blood. It may look like coffee grounds.  You are around people who have [place] measles, fifth disease, or chickenpox.  You have a very bad headache.  You have shortness of breath.  You have any kind of trauma, such as from a fall or a car accident.   This information is not intended to replace advice given to you by your health care provider. Make sure you discuss any questions you have with your health care provider.   Document Released: 05/06/2008 Document Revised: 12/09/2014 Document Reviewed: 09/28/2013 Elsevier Interactive Patient Education [employer]

## 2015-10-04 NOTE — ED Notes (Signed)
Patient back from US.

## 2015-10-04 NOTE — ED Notes (Signed)
Patient transported to Ultrasound 

## 2015-10-04 NOTE — ED Provider Notes (Signed)
-----------------------------------------   6:56 PM on 10/04/2015 -----------------------------------------  I accepted signout from Dr. Alphonzo LemmingsMcShane for this patient who is pregnant and needed an ultrasound.  I checked multiple times to see if the ultrasound had been completed. There was a little bit of a delay. At this time the result has returned:  Ultrasound: IMPRESSION: 1. Single live intrauterine embryo with estimated gestational age [redacted] weeks 0 days by crown-rump length. [facility]. 2. No evidence of subchorionic hemorrhage. 3. Normal-appearing ovaries with an approximate 1.4 cm corpus luteum cyst in the left ovary. 4. No adnexal masses or free pelvic fluid.  Reexamination of patient at this time finds her comfortable. She says her nausea is overall controlled. She has been sipping on fluids. We will treat her with one more small dose of Zofran now. I did discuss the risks and benefits of Zofran versus other antibiotics with her given her pregnancy. She understands the small risk of Zofran but the preference for it given the low side effect profile.  We will refer the patient to the on-call gynecology service-Dr. Greggory Keenefrancesco. [place] may seek follow[place] there as well.   Final diagnosis  Nausea vomiting Pregnancy, 7 weeks  Andrea Ramusavid W Eliasar Hlavaty, MD 10/04/15 [phone]

## 2015-10-04 NOTE — ED Notes (Signed)
Pt to triage c/o emesis, dizziness, weakness and some diarrhea. Unsure if having fevers. Started Saturday nite.

## 2015-10-04 NOTE — ED Provider Notes (Addendum)
[facility] Emergency Department Provider Note  ____________________________________________   I have reviewed the triage vital signs and the nursing notes.   HISTORY  Chief Complaint Emesis    HPI Andrea [name] is a 34 y.o. female who is baseline healthy, did have her gallbladder taken out remotely, no recent antibiotics or travel, presents today complaining of nausea vomiting diarrhea. She and a friend ate the same meal on Saturday and on Sunday she has been having vomiting and diarrhea. No hematemesis no melena bright red blood per rectum no recent antibiotics no recent travel. Positive sick contacts with similar. Diffuse abdominal cramping which is now getting relief with IV fluid.  Past Medical History  Diagnosis Date  . Polycystic ovarian disease     There are no active problems to display for this patient.   Past Surgical History  Procedure Laterality Date  . Tonsillectomy    . Tonsillectomy and adenoidectomy    . Cholecystectomy      Current Outpatient Rx  Name  Route  Sig  Dispense  Refill  . DULoxetine (CYMBALTA) 60 MG capsule   Oral   Take 1 capsule by mouth 2 (two) times daily.      0   . gabapentin (NEURONTIN) 300 MG capsule   Oral   Take 1 capsule by mouth daily.      0   . meloxicam (MOBIC) 15 MG tablet   Oral   Take 1 tablet by mouth daily.      0     Allergies Flagyl  History reviewed. No pertinent family history.  Social History Social History  Substance Use Topics  . Smoking status: Former Games developer  . Smokeless tobacco: Never Used  . Alcohol Use: Yes     Comment: occasionally    Review of Systems Constitutional: No fever/chills Eyes: No visual changes. ENT: No sore throat. No stiff neck no neck pain Cardiovascular: Denies chest pain. Respiratory: Denies shortness of breath. Gastrointestinal:   See history of present illness Genitourinary: Negative for dysuria. Musculoskeletal: Negative lower  extremity swelling Skin: Negative for rash. Neurological: Negative for headaches, focal weakness or numbness. 10-point ROS otherwise negative.  ____________________________________________   PHYSICAL EXAM:  VITAL SIGNS: ED Triage Vitals  Enc Vitals Group     BP 10/04/15 0849 119/79 mmHg     Pulse Rate 10/04/15 0849 95     Resp 10/04/15 0849 20     Temp 10/04/15 0849 98.1 F (36.7 C)     Temp Source 10/04/15 0849 Oral     SpO2 10/04/15 0849 100 %     Weight 10/04/15 0849 153 lb (69.4 kg)     Height 10/04/15 0849  (1.702 m)     Head Cir --      Peak Flow --      Pain Score --      Pain Loc --      Pain Edu? --      Excl. in GC? --     Constitutional: Alert and oriented. Well appearing and in no acute distress. Eyes: Conjunctivae are normal. PERRL. EOMI. Head: Atraumatic. Nose: No congestion/rhinnorhea. Mouth/Throat: Mucous membranes are moist.  Oropharynx non-erythematous. Neck: No stridor.   Nontender with no meningismus Cardiovascular: Normal rate, regular rhythm. Grossly normal heart sounds.  Good peripheral circulation. Respiratory: Normal respiratory effort.  No retractions. Lungs CTAB. Abdominal: Soft and nontender. No distention. No guarding no rebound Back:  There is no focal tenderness or step off there is  no midline tenderness there are no lesions noted. there is no CVA tenderness Musculoskeletal: No lower extremity tenderness. No joint effusions, no DVT signs strong distal pulses no edema Neurologic:  Normal speech and language. No gross focal neurologic deficits are appreciated.  Skin:  Skin is warm, dry and intact. No rash noted. Psychiatric: Mood and affect are normal. Speech and behavior are normal.  ____________________________________________   LABS (all labs ordered are listed, but only abnormal results are displayed)  Labs Reviewed  LIPASE, BLOOD  COMPREHENSIVE METABOLIC PANEL  CBC  URINALYSIS COMPLETEWITH MICROSCOPIC ([facility] ONLY)  POC  URINE PREG, ED   ____________________________________________  EKG  EKG ordered by triage, reviewed and interpreted by me, normal sinus rhythm rate 65 bpm, no acute ST elevation or acute ST depression. ____________________________________________  RADIOLOGY   ____________________________________________   PROCEDURES  Procedure(s) performed: None  Critical Care performed: None  ____________________________________________   INITIAL IMPRESSION / ASSESSMENT AND PLAN / ED COURSE  Pertinent labs & imaging results that were available during my care of the patient were reviewed by me and considered in my medical decision making (see chart for details).  Patient with nausea vomiting and diarrhea consistent with either viral or food poisoning. No evidence of acute intra-abdominal pathology requiring imaging or surgery. The patient closely here. She is feeling better with hydration. No recent travel and nothing to suggest C. difficile. ____________________________________________  ----------------------------------------- 2:09 PM on 10/04/2015 -----------------------------------------  Patient has a history of PCO S and very irregular periods, she was told she likely could never be pregnant. However, her urine pregnancy is positive here. She is quite surprised by this. We will obtain a beta hCG to verify. I do not think that pregnancy is likely causing her nausea vomiting and diarrhea. However, we may need to do an ultrasound for staging as the patient states that it's been many months since her last period and she has irregular periods at baseline. She does not recall exactly when her last period was. Serial abdominal exams are benign, and patient is Aware of our findings. Overall, she feels much better at this time with no evidence of ongoing vomiting, she does have slight nausea.   ----------------------------------------- 3:45 PM on  10/04/2015 -----------------------------------------  Pelvic exam: Female nurse chaperone present, no external lesions noted, physiologic vaginal discharge noted with no purulent discharge, no cervical motion tenderness, no adnexal tenderness or mass, there is no significant uterine tenderness or mass. No vaginal bleeding   Tolerating po. Feels much better. Will obtain US as she has no idea of what her dates are and it could affect her decision making.    FINAL CLINICAL IMPRESSION(S) / ED DIAGNOSES  Final diagnoses:  None     Jeanmarie PlantJames A Lasheba Stevens, MD 10/04/15 1226  Jeanmarie PlantJames A Daziyah Cogan, MD 10/04/15 1410  Jeanmarie PlantJames A Enid Maultsby, MD 10/04/15 1545  Jeanmarie PlantJames A Denorris Reust, MD 10/04/15 [phone]

## 2015-10-16 ENCOUNTER — Telehealth: Payer: Self-pay | Admitting: *Deleted

## 2015-10-16 NOTE — Telephone Encounter (Signed)
Patient believes she is having an allergic reaction to something and wants to know if she can take Benadryl. 1:45 LM on VM- Ok to take Benandryl- if symptoms do not get better needs to go to MAU for evaluation.

## 2015-10-17 ENCOUNTER — Encounter (HOSPITAL_COMMUNITY): Payer: Self-pay | Admitting: *Deleted

## 2015-10-17 ENCOUNTER — Inpatient Hospital Stay (HOSPITAL_COMMUNITY)
Admission: AD | Admit: 2015-10-17 | Discharge: 2015-10-17 | Disposition: A | Discharge status: Home / Self Care | Payer: Medicaid Other | Source: Ambulatory Visit | Attending: Obstetrics | Admitting: Obstetrics

## 2015-10-17 DIAGNOSIS — Z87891 Personal history of nicotine dependence: Secondary | ICD-10-CM | POA: Diagnosis not present

## 2015-10-17 DIAGNOSIS — L239 Allergic contact dermatitis, unspecified cause: Secondary | ICD-10-CM

## 2015-10-17 DIAGNOSIS — L200 Besnier's prurigo: Secondary | ICD-10-CM | POA: Diagnosis not present

## 2015-10-17 DIAGNOSIS — R21 Rash and other nonspecific skin eruption: Secondary | ICD-10-CM | POA: Diagnosis present

## 2015-10-17 DIAGNOSIS — Z3A01 Less than 8 weeks gestation of pregnancy: Secondary | ICD-10-CM | POA: Diagnosis not present

## 2015-10-17 DIAGNOSIS — O26891 Other specified pregnancy related conditions, first trimester: Secondary | ICD-10-CM | POA: Diagnosis not present

## 2015-10-17 MED ORDER — TRIAMCINOLONE ACETONIDE 0.1 % EX CREA: 1.0000 "application " | TOPICAL_CREAM | Freq: Two times a day (BID) | CUTANEOUS | Status: DC

## 2015-10-17 NOTE — MAU Note (Signed)
Pt woke up with rash on her neck on Sunday, rash has now spread down her chest & under breasts.  Is using hydrocortisone cream, diaper rash cream, has taken benadryl since yesterday - is not helping.  Rash appears red & raised.

## 2015-10-17 NOTE — MAU Provider Note (Signed)
History     CSN: [id number]  Arrival date and time: 10/17/15 1217   First Provider Initiated Contact with Patient 10/17/15 1255      Chief Complaint  Patient presents with  . Rash   HPI  Ms. Andrea [name] is a 34 y.o. G1P0 at [redacted]w[redacted]d who presents to MAU today with complaint of rash of the neck and chest x 2-3 days. The patient states a history of sensitive skin and breakouts. She used to use Triamcinolone cream, but has ran out. She has tried hydrocortisone cream, benadryl PO and diaper rash cream without relief. She denies SOB or swelling of the mouth, tongue or lips. She does endorse itching of the affected area. She denies any change in products recently to account for the rash.   OB History    Gravida Para Term Preterm AB TAB SAB Ectopic Multiple Living   1               Past Medical History  Diagnosis Date  . Polycystic ovarian disease     Past Surgical History  Procedure Laterality Date  . Tonsillectomy    . Tonsillectomy and adenoidectomy    . Cholecystectomy      No family history on file.  Social History  Substance Use Topics  . Smoking status: Former Games developer  . Smokeless tobacco: Never Used  . Alcohol Use: Yes     Comment: occasionally    Allergies:  Allergies  Allergen Reactions  . Flagyl [Metronidazole Hcl] Swelling    Prescriptions prior to admission  Medication Sig Dispense Refill Last Dose  . DULoxetine (CYMBALTA) 60 MG capsule Take 1 capsule by mouth 2 (two) times daily.  0 10/03/2015 at Unknown time  . gabapentin (NEURONTIN) 300 MG capsule Take 1 capsule by mouth daily.  0 10/03/2015 at Unknown time  . meloxicam (MOBIC) 15 MG tablet Take 1 tablet by mouth daily.  0 10/03/2015 at Unknown time  . ondansetron (ZOFRAN) 4 MG tablet Take 1 tablet (4 mg total) by mouth daily as needed for nausea or vomiting. 10 tablet 0     Review of Systems  Constitutional: Negative for fever and malaise/fatigue.  Respiratory: Negative for shortness of breath.    Cardiovascular: Negative for chest pain.  Gastrointestinal: Negative for abdominal pain.  Genitourinary:       Neg - vaginal bleeding  Skin: Positive for itching and rash.   Physical Exam   Blood pressure 113/64, pulse 76, temperature 98.1 F (36.7 C), temperature source Oral, resp. rate 16, last menstrual period 09/21/2015.  Physical Exam  Nursing note and vitals reviewed. Constitutional: She is oriented to person, place, and time. She appears well-developed and well-nourished. No distress.  HENT:  Head: Normocephalic and atraumatic.  Cardiovascular: Normal rate.   Respiratory: Effort normal.  Neurological: She is alert and oriented to person, place, and time.  Skin: Skin is warm and dry. Rash noted. There is erythema.  Large area of erythematous rash of the anterior neck and upper chest area. Pinpoint raised areas throughout more concentrated on the neck.   Psychiatric: She has a normal mood and affect.    MAU Course  Procedures None  MDM Patient is stable without respiratory distress. Most likely contact dermatitis. Discussed changes in products to attempt to pinpoint source of the reaction.   Assessment and Plan  A: Pregnant Allergic dermatitis  P: Discharge home Rx for triamcinolone cream sent  Advised OTC Benadryl cream and zyrtec OTC for symptomatic relief.  First trimester precautions discussed Patient advised to follow-up with [facility] as scheduled to start prenatal care Patient may return to MAU as needed or if her condition were to change or worsen   Marny LowensteinJulie N Wenzel, PA-C  10/17/2015, 12:55 PM

## 2015-10-17 NOTE — Discharge Instructions (Signed)

## 2015-10-20 ENCOUNTER — Encounter (HOSPITAL_COMMUNITY): Payer: Self-pay | Admitting: *Deleted

## 2015-10-20 ENCOUNTER — Inpatient Hospital Stay (HOSPITAL_COMMUNITY)
Admission: AD | Admit: 2015-10-20 | Discharge: 2015-10-20 | Disposition: A | Discharge status: Home / Self Care | Payer: Medicaid Other | Source: Ambulatory Visit | Attending: Obstetrics and Gynecology | Admitting: Obstetrics and Gynecology

## 2015-10-20 DIAGNOSIS — L239 Allergic contact dermatitis, unspecified cause: Secondary | ICD-10-CM | POA: Diagnosis not present

## 2015-10-20 DIAGNOSIS — Z87891 Personal history of nicotine dependence: Secondary | ICD-10-CM | POA: Diagnosis not present

## 2015-10-20 DIAGNOSIS — R21 Rash and other nonspecific skin eruption: Secondary | ICD-10-CM | POA: Diagnosis present

## 2015-10-20 DIAGNOSIS — L509 Urticaria, unspecified: Secondary | ICD-10-CM | POA: Insufficient documentation

## 2015-10-20 DIAGNOSIS — L200 Besnier's prurigo: Secondary | ICD-10-CM

## 2015-10-20 HISTORY — DX: Rosacea, unspecified: L71.9

## 2015-10-20 HISTORY — DX: Psoriasis, unspecified: L40.9

## 2015-10-20 MED ORDER — METHYLPREDNISOLONE 4 MG PO TABS
ORAL_TABLET | ORAL | Status: DC
Start: 2015-10-20 — End: 2015-10-31

## 2015-10-20 NOTE — MAU Provider Note (Signed)
History     CSN: [id number]  Arrival date and time: 10/20/15 0941   First Provider Initiated Contact with Patient 10/20/15 1020         Chief Complaint  Patient presents with  . Rash   Andrea [name] is a 34 y.o. G2P0102 at 1646w2d who presents with rash.   Rash This is a new problem. The current episode started in the past 7 days. The problem has been gradually worsening since onset. The affected locations include the face, neck and chest. The rash is characterized by itchiness, redness and pain. She was exposed to nothing. Pertinent negatives include no cough, eye pain, fever, nail changes, shortness of breath, sore throat or vomiting. Past treatments include anti-itch cream and topical steroids. The treatment provided no relief. There is no history of asthma or eczema.     OB History    Gravida Para Term Preterm AB TAB SAB Ectopic Multiple Living   2 1  1      2       Past Medical History  Diagnosis Date  . Polycystic ovarian disease     Past Surgical History  Procedure Laterality Date  . Tonsillectomy    . Tonsillectomy and adenoidectomy    . Cholecystectomy      Family History  Problem Relation Age of Onset  . Cancer Mother   . Cancer Father   . Cancer Maternal Grandmother     Social History  Substance Use Topics  . Smoking status: Former Games developermoker  . Smokeless tobacco: Never Used  . Alcohol Use: Yes     Comment: occasionally    Allergies: No Known Allergies  Prescriptions prior to admission  Medication Sig Dispense Refill Last Dose  . diphenhydrAMINE (BENADRYL) 2 % cream Apply 1 application topically 3 (three) times daily as needed for itching.   10/19/2015 at Unknown time  . hydrocortisone cream 1 % Apply 1 application topically 2 (two) times daily as needed for itching.   10/19/2015 at Unknown time  . Prenatal Vit-Fe Fumarate-FA (PRENATAL MULTIVITAMIN) TABS tablet Take 1 tablet by mouth daily at 12 noon.   10/20/2015 at Unknown time  .  triamcinolone cream (KENALOG) 0.1 % Apply 1 application topically 2 (two) times daily. 30 g 0 10/20/2015 at Unknown time  . ondansetron (ZOFRAN) 4 MG tablet Take 1 tablet (4 mg total) by mouth daily as needed for nausea or vomiting. (Patient not taking: Reported on 10/20/2015) 10 tablet 0 Not Taking at Unknown time    Review of Systems  Constitutional: Negative.  Negative for fever.  HENT: Negative.  Negative for sore throat.   Eyes: Negative.  Negative for pain.  Respiratory: Negative for cough and shortness of breath.   Gastrointestinal: Negative.  Negative for vomiting.  Musculoskeletal: Negative.   Skin: Positive for itching and rash. Negative for nail changes.   Physical Exam   Blood pressure 123/72, pulse 81, temperature 98.5 F (36.9 C), temperature source Oral, resp. rate 18, height 5\' 7"  (1.702 m), weight 153 lb (69.4 kg), last menstrual period 09/21/2015.  Physical Exam  Constitutional: She appears well-developed and well-nourished. No distress.  HENT:  Head: Normocephalic and atraumatic.  Mouth/Throat: Oropharynx is clear and moist.  Eyes: Right eye exhibits no discharge. Left eye exhibits no discharge. No scleral icterus.  Neck: Normal range of motion. Neck supple.  Respiratory: Effort normal. No respiratory distress.  Lymphadenopathy:    She has no cervical adenopathy.  Skin: Rash noted. Rash is maculopapular. She is  not diaphoretic.  Maculopapular erythematous rash on face, neck & chest. Warm to touch and blanches.  See picture below      MAU Course  Procedures  MDM Last seen for rash in MAU on 11/15; given steroid cream. Has been alternating PO & topical benadryl, zyrtec, cortisone cream, & triamcinolone cream with no improvement.  C/w Dr. Alvester Morin who came to see rash.  Pt scheduled to see [facility] for prenatal care later this month.  Will give PO steroids & have f/u with PCP if symptoms don't improve.   Assessment and Plan  A:  1. Allergic dermatitis   2.  Localized hives    P: Discharge home Rx medrol taper Continue zyrtec & creams Keep scheduled prenatal care with [facility] F/u with PCP if symptoms don't improve or worsen  Judeth Horn, NP  10/20/2015, 10:20 AM

## 2015-10-20 NOTE — Discharge Instructions (Signed)

## 2015-10-20 NOTE — MAU Note (Signed)
Urine sent to lab 

## 2015-10-20 NOTE — MAU Note (Signed)
Was here on the 15th for the same. Rash has gotten worse. Skin is now tender to touch

## 2015-10-31 ENCOUNTER — Ambulatory Visit (INDEPENDENT_AMBULATORY_CARE_PROVIDER_SITE_OTHER): Payer: Medicaid Other | Admitting: Certified Nurse Midwife

## 2015-10-31 ENCOUNTER — Encounter: Payer: Self-pay | Admitting: Certified Nurse Midwife

## 2015-10-31 VITALS — BP 123/79 | HR 82 | Temp 98.2°F | Wt 162.0 lb

## 2015-10-31 DIAGNOSIS — N760 Acute vaginitis: Secondary | ICD-10-CM

## 2015-10-31 DIAGNOSIS — K5900 Constipation, unspecified: Secondary | ICD-10-CM | POA: Diagnosis not present

## 2015-10-31 DIAGNOSIS — G8929 Other chronic pain: Secondary | ICD-10-CM | POA: Diagnosis not present

## 2015-10-31 DIAGNOSIS — Z23 Encounter for immunization: Secondary | ICD-10-CM

## 2015-10-31 DIAGNOSIS — L409 Psoriasis, unspecified: Secondary | ICD-10-CM

## 2015-10-31 DIAGNOSIS — O219 Vomiting of pregnancy, unspecified: Secondary | ICD-10-CM

## 2015-10-31 DIAGNOSIS — O09293 Supervision of pregnancy with other poor reproductive or obstetric history, third trimester: Secondary | ICD-10-CM

## 2015-10-31 DIAGNOSIS — M549 Dorsalgia, unspecified: Secondary | ICD-10-CM

## 2015-10-31 DIAGNOSIS — O99611 Diseases of the digestive system complicating pregnancy, first trimester: Secondary | ICD-10-CM

## 2015-10-31 DIAGNOSIS — O09211 Supervision of pregnancy with history of pre-term labor, first trimester: Secondary | ICD-10-CM | POA: Diagnosis not present

## 2015-10-31 DIAGNOSIS — Z3481 Encounter for supervision of other normal pregnancy, first trimester: Secondary | ICD-10-CM

## 2015-10-31 LAB — POCT URINALYSIS DIPSTICK
Bilirubin, UA: NEGATIVE
GLUCOSE UA: NEGATIVE
Ketones, UA: NEGATIVE
Leukocytes, UA: NEGATIVE
NITRITE UA: NEGATIVE
PROTEIN UA: NEGATIVE
RBC UA: NEGATIVE
Spec Grav, UA: 1.015
UROBILINOGEN UA: NEGATIVE
pH, UA: 5

## 2015-10-31 LAB — TSH: TSH: 0.85 u[IU]/mL (ref 0.350–4.500)

## 2015-10-31 MED ORDER — CITRANATAL HARMONY 30-1-260 MG PO CAPS: 1.0000 | ORAL_CAPSULE | Freq: Every day | ORAL | Status: DC

## 2015-10-31 MED ORDER — FLUCONAZOLE 100 MG PO TABS: 100.0000 mg | ORAL_TABLET | Freq: Once | ORAL | Status: DC

## 2015-10-31 MED ORDER — DOCUSATE SODIUM 100 MG PO CAPS: 100.0000 mg | ORAL_CAPSULE | Freq: Two times a day (BID) | ORAL | Status: DC

## 2015-10-31 MED ORDER — GABAPENTIN 600 MG PO TABS: 600.0000 mg | ORAL_TABLET | Freq: Three times a day (TID) | ORAL | Status: DC

## 2015-10-31 MED ORDER — DOXYLAMINE-PYRIDOXINE 10-10 MG PO TBEC: 2.0000 | DELAYED_RELEASE_TABLET | Freq: Every day | ORAL | Status: DC

## 2015-10-31 MED ORDER — PROMETHAZINE HCL 25 MG PO TABS: 25.0000 mg | ORAL_TABLET | Freq: Four times a day (QID) | ORAL | Status: DC | PRN

## 2015-10-31 NOTE — Progress Notes (Signed)
Subjective:    Andrea [name] is being seen today for her first obstetrical visit.  This is not a planned pregnancy. She is at 6191w6d gestation. Her obstetrical history is significant for former placenta previa, PPROm at 33 weeks, PCOS and infertility. Hx of spinal and knee degeneration: was on malaxicam, gabapentin & cymbalta.  Relationship with FOB: significant other, living together. Patient does intend to breast feed. Pregnancy history fully reviewed.  The information documented in the HPI was reviewed and verified.  Menstrual History: OB History    Gravida Para Term Preterm AB TAB SAB Ectopic Multiple Living   2 1 0 1 0 0 0 0 0 1       Menarche age: 34 years of age  Patient's last menstrual period was 09/21/2015 (approximate).    Past Medical History  Diagnosis Date  . Polycystic ovarian disease   . Psoriasis   . Rosacea     Past Surgical History  Procedure Laterality Date  . Tonsillectomy and adenoidectomy    . Cholecystectomy       (Not in a hospital admission) No Known Allergies  Social History  Substance Use Topics  . Smoking status: Former Games developermoker  . Smokeless tobacco: Never Used  . Alcohol Use: No    Family History  Problem Relation Age of Onset  . Cancer Mother   . Cancer Father   . Cancer Maternal Grandmother      Review of Systems Constitutional: negative for weight loss Gastrointestinal: negative for vomiting Genitourinary:negative for genital lesions and vaginal discharge and dysuria Musculoskeletal:negative for back pain Behavioral/Psych: negative for abusive relationship, depression, illegal drug usage and tobacco use    Objective:    BP 123/79 mmHg  Pulse 82  Temp(Src) 98.2 F (36.8 C)  Wt 162 lb (73.483 kg)  LMP 09/21/2015 (Approximate) General Appearance:    Alert, cooperative, no distress, appears stated age  Head:    Normocephalic, without obvious abnormality, atraumatic  Eyes:    PERRL, conjunctiva/corneas clear, EOM's intact,  fundi    benign, both eyes  Ears:    Normal TM's and external ear canals, both ears  Nose:   Nares normal, septum midline, mucosa normal, no drainage    or sinus tenderness  Throat:   Lips, mucosa, and tongue normal; teeth and gums normal  Neck:   Supple, symmetrical, trachea midline, no adenopathy;    thyroid:  no enlargement/tenderness/nodules; no carotid   bruit or JVD  Back:     Symmetric, no curvature, ROM normal, no CVA tenderness  Lungs:     Clear to auscultation bilaterally, respirations unlabored  Chest Wall:    No tenderness or deformity   Heart:    Regular rate and rhythm, S1 and S2 normal, no murmur, rub   or gallop  Breast Exam:    No tenderness, masses, or nipple abnormality  Abdomen:     Soft, non-tender, bowel sounds active all four quadrants,    no masses, no organomegaly  Genitalia:    Normal female without lesion, discharge or tenderness  Extremities:   Extremities normal, atraumatic, no cyanosis or edema  Pulses:   2+ and symmetric all extremities  Skin:   Skin color, texture, turgor normal, no rashes or lesions  Lymph nodes:   Cervical, supraclavicular, and axillary nodes normal  Neurologic:   CNII-XII intact, normal strength, sensation and reflexes    throughout  Cervix:  Long, thick, closed and posterior                                   Fetal heart activity on bedside US  Lab Review Urine pregnancy test Labs reviewed yes Radiologic studies reviewed yes Assessment:    Pregnancy at [redacted]w[redacted]d weeks   Rosacia & psoriasis  Unsure of LMP Hx of PPROM at 33 weeks with last pregnancy  Flu vaccine given  Hx of lumbar and knee arthritis  N&V in early pregnancy  Vulvovaginal candidasis  Plan:    Prenatal vitamins.  Counseling provided regarding continued use of seat belts, cessation of alcohol consumption, smoking or use of illicit drugs; infection precautions i.e., influenza/TDAP immunizations, toxoplasmosis,CMV, parvovirus, listeria  and varicella; workplace safety, exercise during pregnancy; routine dental care, safe medications, sexual activity, hot tubs, saunas, pools, travel, caffeine use, fish and methlymercury, potential toxins, hair treatments, varicose veins Weight gain recommendations per IOM guidelines reviewed: underweight/BMI< 18.5--> gain 28 - 40 lbs; normal weight/BMI 18.5 - 24.9--> gain 25 - 35 lbs; overweight/BMI 25 - 29.9--> gain 15 - 25 lbs; obese/BMI >30->gain  11 - 20 lbs Problem list reviewed and updated. FIRST/CF mutation testing/NIPT/QUAD SCREEN/fragile X/Ashkenazi Jewish population testing/Spinal muscular atrophy discussed: requested. Role of ultrasound in pregnancy discussed; fetal survey: requested. Amniocentesis discussed: not indicated. VBAC calculator score: VBAC consent form provided Meds ordered this encounter  Medications  . Prenat w/o A-FeCbn-DSS-FA-DHA (CITRANATAL HARMONY) 30-1-260 MG CAPS    Sig: Take 1 tablet by mouth daily.    Dispense:  30 capsule    Refill:  12  . docusate sodium (COLACE) 100 MG capsule    Sig: Take 1 capsule (100 mg total) by mouth 2 (two) times daily.    Dispense:  90 capsule    Refill:  1  . Doxylamine-Pyridoxine (DICLEGIS) 10-10 MG TBEC    Sig: Take 2 tablets by mouth at bedtime.    Dispense:  100 tablet    Refill:  2  . promethazine (PHENERGAN) 25 MG tablet    Sig: Take 1 tablet (25 mg total) by mouth every 6 (six) hours as needed for nausea or vomiting.    Dispense:  30 tablet    Refill:  0  . gabapentin (NEURONTIN) 600 MG tablet    Sig: Take 1 tablet (600 mg total) by mouth 3 (three) times daily.    Dispense:  90 tablet    Refill:  5  . fluconazole (DIFLUCAN) 100 MG tablet    Sig: Take 1 tablet (100 mg total) by mouth once. Repeat dose in 48-72 hour.    Dispense:  3 tablet    Refill:  0   Orders Placed This Encounter  Procedures  . Culture, OB Urine  . SureSwab, Vaginosis/Vaginitis Plus  . [place] MFM Fetal Nuchal Translucency    Standing Status:  Future     Number of Occurrences:      Standing Expiration Date: 12/30/2016    Order Specific Question:  Reason for Exam (SYMPTOM  OR DIAGNOSIS REQUIRED)    Answer:  NT scan, hx of PPROM and infertility in other pregnancy    Order Specific Question:  Preferred imaging location?    Answer:  [facility]  . Flu Vaccine QUAD 36+ mos IM  . Obstetric panel  . HIV antibody  . Varicella zoster antibody, IgG  . VITAMIN D 25 Hydroxy (Vit-D Deficiency, Fractures)  . TSH  .  AMB Referral to Maternal Fetal Medicine (MFM)    Referral Priority:  Routine    Referral Type:  Consultation    Number of Visits Requested:  1  . Ambulatory referral to Dermatology    Referral Priority:  Urgent    Referral Type:  Consultation    Referral Reason:  Specialty Services Required    Requested Specialty:  Dermatology    Number of Visits Requested:  1  . POCT urinalysis dipstick    Follow up in 4 weeks. 50% of 30 min visit spent on counseling and coordination of care.

## 2015-11-01 LAB — OBSTETRIC PANEL
ANTIBODY SCREEN: NEGATIVE
BASOS PCT: 0 % (ref 0–1)
Basophils Absolute: 0 10*3/uL (ref 0.0–0.1)
EOS PCT: 1 % (ref 0–5)
Eosinophils Absolute: 0.1 10*3/uL (ref 0.0–0.7)
HEMATOCRIT: 37.1 % (ref 36.0–46.0)
HEMOGLOBIN: 13.1 g/dL (ref 12.0–15.0)
Hepatitis B Surface Ag: NEGATIVE
Lymphocytes Relative: 22 % (ref 12–46)
Lymphs Abs: 2 10*3/uL (ref 0.7–4.0)
MCH: 30.3 pg (ref 26.0–34.0)
MCHC: 35.3 g/dL (ref 30.0–36.0)
MCV: 85.9 fL (ref 78.0–100.0)
MONO ABS: 0.5 10*3/uL (ref 0.1–1.0)
MPV: 9.1 fL (ref 8.6–12.4)
Monocytes Relative: 6 % (ref 3–12)
Neutro Abs: 6.4 10*3/uL (ref 1.7–7.7)
Neutrophils Relative %: 71 % (ref 43–77)
PLATELETS: 213 10*3/uL (ref 150–400)
RBC: 4.32 MIL/uL (ref 3.87–5.11)
RDW: 13.8 % (ref 11.5–15.5)
RH TYPE: POSITIVE
Rubella: 3.55 Index — ABNORMAL HIGH (ref <0.90)
WBC: 9 10*3/uL (ref 4.0–10.5)

## 2015-11-01 LAB — VARICELLA ZOSTER ANTIBODY, IGG: Varicella IgG: 737.7 Index — ABNORMAL HIGH (ref <135.00)

## 2015-11-01 LAB — VITAMIN D 25 HYDROXY (VIT D DEFICIENCY, FRACTURES): VIT D 25 HYDROXY: 30 ng/mL (ref 30–100)

## 2015-11-01 LAB — HIV ANTIBODY (ROUTINE TESTING W REFLEX): HIV 1&2 Ab, 4th Generation: NONREACTIVE

## 2015-11-02 ENCOUNTER — Other Ambulatory Visit: Payer: Self-pay | Admitting: Certified Nurse Midwife

## 2015-11-02 ENCOUNTER — Telehealth: Payer: Self-pay | Admitting: *Deleted

## 2015-11-02 DIAGNOSIS — Z3482 Encounter for supervision of other normal pregnancy, second trimester: Secondary | ICD-10-CM

## 2015-11-02 LAB — CULTURE, OB URINE
COLONY COUNT: NO GROWTH
Organism ID, Bacteria: NO GROWTH

## 2015-11-02 LAB — PAP, TP IMAGING W/ HPV RNA, RFLX HPV TYPE 16,18/45: HPV mRNA, High Risk: NOT DETECTED

## 2015-11-02 MED ORDER — VITAFOL GUMMIES 3.33-0.333-34.8 MG PO CHEW: 3.0000 | CHEWABLE_TABLET | Freq: Every day | ORAL | Status: DC

## 2015-11-02 NOTE — Telephone Encounter (Signed)
Hi Erskine SquibbJane; I sent Vitafol Gummies to the pharmacy instead since she has had a lot of N&V this pregnancy.  Thank you. R.Trinity Haun CNM

## 2015-11-02 NOTE — Telephone Encounter (Signed)
Patient states the pharmacy is having trouble getting her prescription for her prenatal vitamin and they want to know if they can change her to [employer] DHA.

## 2015-11-04 LAB — SURESWAB, VAGINOSIS/VAGINITIS PLUS
Atopobium vaginae: NOT DETECTED Log (cells/mL)
C. PARAPSILOSIS, DNA: NOT DETECTED
C. TRACHOMATIS RNA, TMA: NOT DETECTED
C. TROPICALIS, DNA: NOT DETECTED
C. albicans, DNA: DETECTED — AB
C. glabrata, DNA: DETECTED — AB
Gardnerella vaginalis: NOT DETECTED Log (cells/mL)
LACTOBACILLUS SPECIES: NOT DETECTED Log (cells/mL)
MEGASPHAERA SPECIES: NOT DETECTED Log (cells/mL)
N. gonorrhoeae RNA, TMA: NOT DETECTED
T. vaginalis RNA, QL TMA: NOT DETECTED

## 2015-11-06 ENCOUNTER — Other Ambulatory Visit: Payer: Self-pay | Admitting: Certified Nurse Midwife

## 2015-11-06 NOTE — Telephone Encounter (Signed)
LM on VM- Rx at pharmacy

## 2015-11-07 ENCOUNTER — Other Ambulatory Visit: Payer: Self-pay | Admitting: Certified Nurse Midwife

## 2015-11-15 ENCOUNTER — Other Ambulatory Visit: Payer: Self-pay | Admitting: Certified Nurse Midwife

## 2015-11-15 ENCOUNTER — Ambulatory Visit (HOSPITAL_COMMUNITY)
Admission: RE | Admit: 2015-11-15 | Discharge: 2015-11-15 | Disposition: A | Discharge status: Home / Self Care | Payer: Medicaid Other | Source: Ambulatory Visit | Attending: Certified Nurse Midwife | Admitting: Certified Nurse Midwife

## 2015-11-15 ENCOUNTER — Ambulatory Visit (HOSPITAL_COMMUNITY): Admission: RE | Admit: 2015-11-15 | Payer: Medicaid Other | Source: Ambulatory Visit

## 2015-11-15 ENCOUNTER — Encounter (HOSPITAL_COMMUNITY): Payer: Medicaid Other

## 2015-11-15 DIAGNOSIS — O09293 Supervision of pregnancy with other poor reproductive or obstetric history, third trimester: Secondary | ICD-10-CM

## 2015-11-15 DIAGNOSIS — O09291 Supervision of pregnancy with other poor reproductive or obstetric history, first trimester: Secondary | ICD-10-CM | POA: Insufficient documentation

## 2015-11-15 DIAGNOSIS — Z369 Encounter for antenatal screening, unspecified: Secondary | ICD-10-CM

## 2015-11-15 DIAGNOSIS — Z3A13 13 weeks gestation of pregnancy: Secondary | ICD-10-CM

## 2015-11-15 DIAGNOSIS — Z36 Encounter for antenatal screening of mother: Secondary | ICD-10-CM | POA: Insufficient documentation

## 2015-11-21 ENCOUNTER — Other Ambulatory Visit (HOSPITAL_COMMUNITY): Payer: Self-pay

## 2015-11-22 ENCOUNTER — Other Ambulatory Visit: Payer: Self-pay | Admitting: Certified Nurse Midwife

## 2015-11-28 ENCOUNTER — Ambulatory Visit (INDEPENDENT_AMBULATORY_CARE_PROVIDER_SITE_OTHER): Payer: Medicaid Other | Admitting: Certified Nurse Midwife

## 2015-11-28 ENCOUNTER — Telehealth: Payer: Self-pay

## 2015-11-28 VITALS — BP 124/74 | HR 87 | Temp 98.3°F | Wt 176.0 lb

## 2015-11-28 DIAGNOSIS — O219 Vomiting of pregnancy, unspecified: Secondary | ICD-10-CM

## 2015-11-28 DIAGNOSIS — F419 Anxiety disorder, unspecified: Secondary | ICD-10-CM

## 2015-11-28 DIAGNOSIS — F32A Depression, unspecified: Secondary | ICD-10-CM

## 2015-11-28 DIAGNOSIS — M549 Dorsalgia, unspecified: Secondary | ICD-10-CM

## 2015-11-28 DIAGNOSIS — O99611 Diseases of the digestive system complicating pregnancy, first trimester: Secondary | ICD-10-CM

## 2015-11-28 DIAGNOSIS — Z3482 Encounter for supervision of other normal pregnancy, second trimester: Secondary | ICD-10-CM

## 2015-11-28 DIAGNOSIS — F329 Major depressive disorder, single episode, unspecified: Secondary | ICD-10-CM

## 2015-11-28 DIAGNOSIS — K5900 Constipation, unspecified: Secondary | ICD-10-CM

## 2015-11-28 DIAGNOSIS — F418 Other specified anxiety disorders: Secondary | ICD-10-CM

## 2015-11-28 DIAGNOSIS — G8929 Other chronic pain: Secondary | ICD-10-CM

## 2015-11-28 LAB — POCT URINALYSIS DIPSTICK
Bilirubin, UA: NEGATIVE
Blood, UA: NEGATIVE
GLUCOSE UA: NEGATIVE
KETONES UA: NEGATIVE
LEUKOCYTES UA: NEGATIVE
Nitrite, UA: NEGATIVE
PROTEIN UA: NEGATIVE
SPEC GRAV UA: 1.02
Urobilinogen, UA: NEGATIVE
pH, UA: 5

## 2015-11-28 MED ORDER — CYCLOBENZAPRINE HCL 10 MG PO TABS: 10.0000 mg | ORAL_TABLET | Freq: Three times a day (TID) | ORAL | Status: DC | PRN

## 2015-11-28 MED ORDER — DOCUSATE SODIUM 100 MG PO CAPS: 100.0000 mg | ORAL_CAPSULE | Freq: Two times a day (BID) | ORAL | Status: DC

## 2015-11-28 MED ORDER — ESCITALOPRAM OXALATE 20 MG PO TABS: 20.0000 mg | ORAL_TABLET | Freq: Every day | ORAL | Status: DC

## 2015-11-28 MED ORDER — ONDANSETRON HCL 4 MG PO TABS: 4.0000 mg | ORAL_TABLET | Freq: Every day | ORAL | Status: DC | PRN

## 2015-11-28 NOTE — Progress Notes (Signed)
  Subjective:    Andrea [name] is a 34 y.o. female being seen today for her obstetrical visit. She is at 391w6d gestation. 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  Hx of preterm delivery.  States unable to get her PNV at the pharmacy, [facility], pharmacy states they are unable to get them.    Problem List Items Addressed This Visit    None    Visit Diagnoses    Supervision of other normal pregnancy, antepartum, second trimester    -  Primary    Relevant Orders    POCT urinalysis dipstick    Alpha fetoprotein, maternal    US OB Comp + 14 Wk    Anxiety and depression        Relevant Medications    cyclobenzaprine (FLEXERIL) 10 MG tablet    escitalopram (LEXAPRO) 20 MG tablet    Nausea and vomiting in pregnancy        Relevant Medications    ondansetron (ZOFRAN) 4 MG tablet    Chronic back pain greater than 3 months duration        Relevant Medications    cyclobenzaprine (FLEXERIL) 10 MG tablet      Patient Active Problem List   Diagnosis Date Noted  . Supervision of pregnancy with history of pre-term labor in first trimester 10/31/2015    Objective:     BP 124/74 mmHg  Pulse 87  Temp(Src) 98.3 F (36.8 C)  Wt 176 lb (79.833 kg)  LMP 09/21/2015 (Approximate) Uterine Size: Below umbilicus   FHR: 160's+  Assessment:    Pregnancy @ 201w6d  weeks Anxiety and depression    N&V in early pregnancy Chronic back pain  Plan:    Problem list reviewed and updated. Labs reviewed.  Follow up in 4 weeks. FIRST/CF mutation testing/NIPT/QUAD SCREEN/fragile X/Ashkenazi Jewish population testing/Spinal muscular atrophy discussed: ordered. Role of ultrasound in pregnancy discussed; fetal survey: ordered. Amniocentesis discussed: not indicated. 50% of 25 minute visit spent on counseling and coordination of care.

## 2015-11-28 NOTE — Telephone Encounter (Signed)
PATIENT IS SCH, BUT ORDER NEEDS TO CHANGE TO INCLUDE MFM -  THANKS, BARB

## 2015-11-29 ENCOUNTER — Other Ambulatory Visit: Payer: Self-pay | Admitting: Certified Nurse Midwife

## 2015-11-29 DIAGNOSIS — O09212 Supervision of pregnancy with history of pre-term labor, second trimester: Secondary | ICD-10-CM

## 2015-11-30 LAB — ALPHA FETOPROTEIN, MATERNAL
AFP: 26.5 ng/mL
CURR GEST AGE: 14.6 wks.days
MOM FOR AFP: 1.01
Open Spina bifida: NEGATIVE

## 2015-12-06 ENCOUNTER — Other Ambulatory Visit: Payer: Self-pay | Admitting: Certified Nurse Midwife

## 2015-12-06 ENCOUNTER — Other Ambulatory Visit: Payer: Self-pay | Admitting: *Deleted

## 2015-12-06 ENCOUNTER — Telehealth: Payer: Self-pay | Admitting: *Deleted

## 2015-12-06 DIAGNOSIS — Z3481 Encounter for supervision of other normal pregnancy, first trimester: Secondary | ICD-10-CM

## 2015-12-06 MED ORDER — CITRANATAL HARMONY 30-1-260 MG PO CAPS: 1.0000 | ORAL_CAPSULE | Freq: Every day | ORAL | Status: DC

## 2015-12-06 MED ORDER — POLYETHYLENE GLYCOL 3350 17 GM/SCOOP PO POWD: 17.0000 g | Freq: Every day | ORAL | Status: DC

## 2015-12-06 NOTE — Telephone Encounter (Signed)
I have sent in miralax for her to take.  She can increase her intake of fiber, fruits, veggies.  Thank you.  R.Renai Lopata CNM

## 2015-12-06 NOTE — Telephone Encounter (Signed)
Patient notified- Rx sent to pharmacy. 

## 2015-12-06 NOTE — Progress Notes (Signed)
Reorder of PNV due to error at pharmacy.

## 2015-12-06 NOTE — Telephone Encounter (Signed)
Patient is complaining of severe constipation- she is using [place]itra natal with stool softener and a regular stool softener also. What else can she do?

## 2015-12-24 ENCOUNTER — Other Ambulatory Visit: Payer: Self-pay | Admitting: Certified Nurse Midwife

## 2015-12-25 ENCOUNTER — Ambulatory Visit (HOSPITAL_COMMUNITY)
Admission: RE | Admit: 2015-12-25 | Discharge: 2015-12-25 | Disposition: A | Discharge status: Home / Self Care | Payer: Medicaid Other | Source: Ambulatory Visit | Attending: Certified Nurse Midwife | Admitting: Certified Nurse Midwife

## 2015-12-25 ENCOUNTER — Telehealth: Payer: Self-pay

## 2015-12-25 ENCOUNTER — Other Ambulatory Visit: Payer: Self-pay | Admitting: Certified Nurse Midwife

## 2015-12-25 DIAGNOSIS — Z3A18 18 weeks gestation of pregnancy: Secondary | ICD-10-CM | POA: Diagnosis not present

## 2015-12-25 DIAGNOSIS — O09212 Supervision of pregnancy with history of pre-term labor, second trimester: Secondary | ICD-10-CM | POA: Diagnosis not present

## 2015-12-25 DIAGNOSIS — Z36 Encounter for antenatal screening of mother: Secondary | ICD-10-CM | POA: Diagnosis not present

## 2015-12-25 NOTE — Telephone Encounter (Signed)
patient had appt with [facility] at 11:30am, and stated to them she could not do it as death in family, per Charlee from [facility]

## 2015-12-26 ENCOUNTER — Encounter: Payer: Medicaid Other | Admitting: Certified Nurse Midwife

## 2015-12-26 ENCOUNTER — Other Ambulatory Visit: Payer: Self-pay | Admitting: Certified Nurse Midwife

## 2015-12-26 ENCOUNTER — Institutional Professional Consult (permissible substitution): Payer: Medicaid Other

## 2015-12-26 ENCOUNTER — Ambulatory Visit (HOSPITAL_COMMUNITY): Payer: Medicaid Other | Attending: Obstetrics

## 2016-01-04 ENCOUNTER — Ambulatory Visit (HOSPITAL_COMMUNITY)
Admission: RE | Admit: 2016-01-04 | Discharge: 2016-01-04 | Disposition: A | Discharge status: Home / Self Care | Payer: Medicaid Other | Source: Ambulatory Visit | Attending: Obstetrics | Admitting: Obstetrics

## 2016-01-04 ENCOUNTER — Ambulatory Visit (INDEPENDENT_AMBULATORY_CARE_PROVIDER_SITE_OTHER): Payer: Medicaid Other | Admitting: Certified Nurse Midwife

## 2016-01-04 VITALS — BP 123/71 | HR 80 | Wt 182.6 lb

## 2016-01-04 VITALS — BP 118/75 | HR 72 | Temp 99.1°F | Wt 183.0 lb

## 2016-01-04 DIAGNOSIS — O09212 Supervision of pregnancy with history of pre-term labor, second trimester: Secondary | ICD-10-CM | POA: Insufficient documentation

## 2016-01-04 DIAGNOSIS — Z87891 Personal history of nicotine dependence: Secondary | ICD-10-CM | POA: Diagnosis not present

## 2016-01-04 DIAGNOSIS — G8929 Other chronic pain: Secondary | ICD-10-CM | POA: Insufficient documentation

## 2016-01-04 DIAGNOSIS — F419 Anxiety disorder, unspecified: Secondary | ICD-10-CM | POA: Diagnosis not present

## 2016-01-04 DIAGNOSIS — O99342 Other mental disorders complicating pregnancy, second trimester: Secondary | ICD-10-CM | POA: Insufficient documentation

## 2016-01-04 DIAGNOSIS — Z3482 Encounter for supervision of other normal pregnancy, second trimester: Secondary | ICD-10-CM

## 2016-01-04 DIAGNOSIS — O23592 Infection of other part of genital tract in pregnancy, second trimester: Secondary | ICD-10-CM

## 2016-01-04 DIAGNOSIS — M545 Low back pain: Secondary | ICD-10-CM | POA: Diagnosis not present

## 2016-01-04 DIAGNOSIS — Z3A20 20 weeks gestation of pregnancy: Secondary | ICD-10-CM | POA: Diagnosis not present

## 2016-01-04 DIAGNOSIS — O09211 Supervision of pregnancy with history of pre-term labor, first trimester: Secondary | ICD-10-CM

## 2016-01-04 DIAGNOSIS — N760 Acute vaginitis: Secondary | ICD-10-CM

## 2016-01-04 LAB — POCT URINALYSIS DIPSTICK
BILIRUBIN UA: NEGATIVE
GLUCOSE UA: NEGATIVE
KETONES UA: NEGATIVE
Leukocytes, UA: NEGATIVE
NITRITE UA: NEGATIVE
Protein, UA: NEGATIVE
RBC UA: NEGATIVE
Spec Grav, UA: 1.01
Urobilinogen, UA: NEGATIVE
pH, UA: 5

## 2016-01-04 MED ORDER — FLUCONAZOLE 100 MG PO TABS: 100.0000 mg | ORAL_TABLET | Freq: Once | ORAL | Status: DC

## 2016-01-04 NOTE — Progress Notes (Signed)
Subjective:    Andrea [name] is a 35 y.o. female being seen today for her obstetrical visit. She is at [redacted]w[redacted]d gestation. Patient reports: no bleeding, no leaking and reports 4 contractions yesturday morning in about 20 minutes, nothing since then . Fetal movement: normal.  Just came from MFM.    Problem List Items Addressed This Visit    None    Visit Diagnoses    Supervision of other normal pregnancy, antepartum, second trimester    -  Primary    Relevant Orders    POCT urinalysis dipstick      Patient Active Problem List   Diagnosis Date Noted  . Supervision of pregnancy with history of pre-term labor in first trimester 10/31/2015   Objective:    BP 118/75 mmHg  Pulse 72  Temp(Src) 99.1 F (37.3 C)  Wt 183 lb (83.008 kg)  LMP 09/21/2015 (Approximate) FHT: 158 BPM  Uterine Size: size equals dates     Assessment:    Pregnancy @ [redacted]w[redacted]d    High risk, hx of PPROM & PTL   Plan:   17-P ordered  OBGCT: discussed. Signs and symptoms of preterm labor: discussed.  Labs, problem list reviewed and updated 2 hr GTT planned Follow up in 4 weeks.

## 2016-01-04 NOTE — Consult Note (Signed)
Maternal Fetal Medicine Consultation  Requesting Provider(s): Coral Ceo, MD  Reason for consultation: History of PROM at 33 weeks  HPI: Andrea [name] is a 35 yo G2P0101, EDD 05/22/2016 who is currently at 20w 1d seen for consultation due to a history of PROM in a previous delivery.  Ms. Andrea [name] Past OB history is remarkable for PROM at 33 weeks with subsequent induction of labor at 34 weeks.  Ms. Andrea [name] past medical history is remarkable for PCOS, anxiety and chronic lower back pain.  She was previously on Lexapro and Cymbalta but discontinued these medications once she became pregnant.  She is without complaints today.  She plans to meet with a mental health provider in the near future to discuss her issues with anxiety.  OB History: OB History    Gravida Para Term Preterm AB TAB SAB Ectopic Multiple Living        PMH:  Past Medical History  Diagnosis Date  . Polycystic ovarian disease   . Psoriasis   . Rosacea     PSH:  Past Surgical History  Procedure Laterality Date  . Tonsillectomy and adenoidectomy    . Cholecystectomy     Meds:  Current Outpatient Prescriptions on File Prior to Encounter  Medication Sig Dispense Refill  . cyclobenzaprine (FLEXERIL) 10 MG tablet take 1 tablet by mouth three times a day if needed for muscle spasm 30 tablet 0  . docusate sodium (COLACE) 100 MG capsule Take 1 capsule (100 mg total) by mouth 2 (two) times daily. 90 capsule 1  . Doxylamine-Pyridoxine (DICLEGIS) 10-10 MG TBEC Take 2 tablets by mouth at bedtime. 100 tablet 2  . escitalopram (LEXAPRO) 20 MG tablet Take 1 tablet (20 mg total) by mouth daily. (Patient not taking: Reported on 01/04/2016) 30 tablet 12  . gabapentin (NEURONTIN) 600 MG tablet Take 1 tablet (600 mg total) by mouth 3 (three) times daily. 90 tablet 5  . ondansetron (ZOFRAN) 4 MG tablet Take 1 tablet (4 mg total) by mouth daily as needed. (Patient not taking: Reported on 01/04/2016) 30 tablet 1   . polyethylene glycol powder (GLYCOLAX/MIRALAX) powder Take 17 g by mouth daily. 500 g 3  . Prenat w/o A-FeCbn-DSS-FA-DHA (CITRANATAL HARMONY) 30-1-260 MG CAPS Take 1 tablet by mouth daily. 30 capsule 12  . Prenatal Vit-Fe Fumarate-FA (PRENATAL MULTIVITAMIN) TABS tablet Take 1 tablet by mouth daily at 12 noon. Reported on 01/04/2016     No current facility-administered medications on file prior to encounter.   Allergies: No Known Allergies   FH:  Family History  Problem Relation Age of Onset  . Cancer Mother   . Cancer Father   . Cancer Maternal Grandmother    Soc:  Social History   Social History  . Marital Status: Single    Spouse Name: N/A  . Number of Children: N/A  . Years of Education: N/A   Occupational History  . Not on file.   Social History Main Topics  . Smoking status: Former Games developer  . Smokeless tobacco: Never Used  . Alcohol Use: No  . Drug Use: No  . Sexual Activity: Yes    Birth Control/ Protection: None   Other Topics Concern  . Not on file   Social History Narrative    Review of Systems: no vaginal bleeding or cramping/contractions, no LOF, no nausea/vomiting. All other systems reviewed and are negative.  PE:   Filed Vitals:   01/04/16 1022  BP: 123/71  Pulse: 80    A/P: 1) Single IUP at 20w 1d  2) History of Anxiety / chronic lower back pain - The patients is currently on Gabapentin and Flexeril.  She is off neuro psychotropic medications.  The patient plans to meet with an mental health specialist in the near future.  We briefly reviewed risks / benefits of medications - if deemed necessary, an SSRI medication may be appropriate.  3) Hx of PROM at 33 weeks and preterm delivery - the patient is a candidate for 17-P injections (Makena).  Would recommend that they be initiated as soon as feasible.  Additionally, would recommend cervical length surveillance at least until 24 weeks.  I have tentatively scheduled the patient for cervical length next  week and will continue screening exams every other week until [redacted] weeks gestation.   Thank you for the opportunity to be a part of the care of Andrea [name]. Please contact our office if we can be of further assistance.   I spent approximately 30 minutes with this patient with over 50% of time spent in face-to-face counseling.  Alpha Gula, MD Maternal Fetal Medicine

## 2016-01-09 ENCOUNTER — Encounter (HOSPITAL_COMMUNITY): Payer: Self-pay | Admitting: *Deleted

## 2016-01-09 ENCOUNTER — Inpatient Hospital Stay (HOSPITAL_COMMUNITY)
Admission: AD | Admit: 2016-01-09 | Discharge: 2016-01-09 | Disposition: A | Discharge status: Home / Self Care | Payer: Medicaid Other | Source: Ambulatory Visit | Attending: Obstetrics | Admitting: Obstetrics

## 2016-01-09 DIAGNOSIS — Z87891 Personal history of nicotine dependence: Secondary | ICD-10-CM | POA: Diagnosis not present

## 2016-01-09 DIAGNOSIS — O26892 Other specified pregnancy related conditions, second trimester: Secondary | ICD-10-CM | POA: Diagnosis not present

## 2016-01-09 DIAGNOSIS — R102 Pelvic and perineal pain: Secondary | ICD-10-CM | POA: Insufficient documentation

## 2016-01-09 DIAGNOSIS — N949 Unspecified condition associated with female genital organs and menstrual cycle: Secondary | ICD-10-CM | POA: Diagnosis not present

## 2016-01-09 DIAGNOSIS — E282 Polycystic ovarian syndrome: Secondary | ICD-10-CM | POA: Insufficient documentation

## 2016-01-09 LAB — URINALYSIS, ROUTINE W REFLEX MICROSCOPIC
Bilirubin Urine: NEGATIVE
Glucose, UA: NEGATIVE mg/dL
Hgb urine dipstick: NEGATIVE
Ketones, ur: NEGATIVE mg/dL
Leukocytes, UA: NEGATIVE
Nitrite: NEGATIVE
Protein, ur: NEGATIVE mg/dL
Specific Gravity, Urine: >1.03 — ABNORMAL HIGH (ref 1.005–1.030)
pH: 6 (ref 5.0–8.0)

## 2016-01-09 NOTE — MAU Note (Signed)
Been having a lot of pressure in lower abd since she was about 3-4 months.  Mentioned it to her dr, was told it was normal.  Over the weekend has been feeling like she is gettting kicked in the vagina.  Hx of PTL

## 2016-01-09 NOTE — MAU Provider Note (Signed)
History   G2P0101 @ 20.6 wks in with c/o lower abd pressure and sharp shooting pain into vagina that has been going on for 3-5 days. Pain is intermittant but pressure is constant. Hx of preterm delivery at 33 wks. Presently being treated for vag yeast.  CSN: [id number]  Arrival date & time 01/09/16  1611   None     Chief Complaint  Patient presents with  . Pelvic Pain  . Vaginal Pain    Pelvic Pain The patient's primary symptoms include pelvic pain. This is a new problem. The current episode started in the past 7 days. The problem occurs intermittently. The problem has been unchanged. The pain is mild. The problem affects both sides. She is pregnant. Associated symptoms comments: Pelvic pressure and intermittant shooting pain into vagina. The symptoms are aggravated by activity. She has tried nothing for the symptoms. The treatment provided no relief. She is sexually active. No, her partner does not have an STD. She uses nothing for contraception. Her menstrual history has been regular. Her past medical history is significant for ovarian cysts.  Vaginal Pain The patient's primary symptoms include pelvic pain. Her past medical history is significant for ovarian cysts.    Past Medical History  Diagnosis Date  . Polycystic ovarian disease   . Psoriasis   . Rosacea     Past Surgical History  Procedure Laterality Date  . Tonsillectomy and adenoidectomy    . Cholecystectomy      Family History  Problem Relation Age of Onset  . Cancer Mother   . Cancer Father   . Cancer Maternal Grandmother     Social History  Substance Use Topics  . Smoking status: Former Games developer  . Smokeless tobacco: Never Used  . Alcohol Use: No    OB History    Gravida Para Term Preterm AB TAB SAB Ectopic Multiple Living        Review of Systems  Constitutional: Negative.   HENT: Negative.   Eyes: Negative.   Respiratory: Negative.   Cardiovascular: Negative.    Gastrointestinal:       Pelvic pressure  Endocrine: Negative.   Genitourinary: Positive for vaginal pain and pelvic pain.  Musculoskeletal: Negative.   Skin: Negative.   Allergic/Immunologic: Negative.   Neurological: Negative.   Hematological: Negative.   Psychiatric/Behavioral: Negative.     Allergies  Review of patient's allergies indicates no known allergies.  Home Medications  No current outpatient prescriptions on file.  BP 118/69 mmHg  Pulse 81  Temp(Src) 98.2 F (36.8 C) (Oral)  Resp 18  Wt 182 lb 6.4 oz (82.736 kg)  LMP 09/21/2015 (Approximate)  Physical Exam  Constitutional: She is oriented to person, place, and time. She appears well-developed and well-nourished.  HENT:  Head: Normocephalic.  Eyes: Pupils are equal, round, and reactive to light.  Neck: Normal range of motion.  Cardiovascular: Normal rate, regular rhythm, normal heart sounds and intact distal pulses.   Pulmonary/Chest: Effort normal and breath sounds normal.  Abdominal: Soft. Bowel sounds are normal.  Genitourinary: Vagina normal and uterus normal.  Musculoskeletal: Normal range of motion.  Neurological: She is alert and oriented to person, place, and time. She has normal reflexes.  Skin: Skin is warm and dry.  Psychiatric: She has a normal mood and affect. Her behavior is normal. Judgment and thought content normal.    MAU Course  Procedures (including critical care time)  Labs Reviewed  URINALYSIS,  ROUTINE W REFLEX MICROSCOPIC (NOT AT [facility]) - Abnormal; Notable for the following:    Specific Gravity, Urine >1.030 (*)    All other components within normal limits   No results found.   No diagnosis found.    MDM  SVE fim/cl/post/th/high. FHR 160 per doppler. Will d/c home to f/u with Dr. Clearance Coots next week.

## 2016-01-09 NOTE — Discharge Instructions (Signed)

## 2016-01-10 ENCOUNTER — Ambulatory Visit (INDEPENDENT_AMBULATORY_CARE_PROVIDER_SITE_OTHER): Payer: Medicaid Other | Admitting: *Deleted

## 2016-01-10 VITALS — BP 106/62 | HR 73 | Temp 98.5°F | Wt 185.0 lb

## 2016-01-10 DIAGNOSIS — O09212 Supervision of pregnancy with history of pre-term labor, second trimester: Secondary | ICD-10-CM

## 2016-01-10 DIAGNOSIS — Z8751 Personal history of pre-term labor: Secondary | ICD-10-CM

## 2016-01-10 MED ORDER — HYDROXYPROGESTERONE CAPROATE 250 MG/ML IM OIL
250.0000 mg | TOPICAL_OIL | INTRAMUSCULAR | Status: AC
  Administered 2016-01-10 – 2016-04-18 (×15): 250 mg via INTRAMUSCULAR

## 2016-01-10 NOTE — Progress Notes (Signed)
Patient in office for 17-P injection.  Patient due for next injection in 1 week.  BP 106/62 mmHg  Pulse 73  Temp(Src) 98.5 F (36.9 C)  Wt 185 lb (83.915 kg)  LMP 09/21/2015 (Approximate)

## 2016-01-11 ENCOUNTER — Ambulatory Visit (HOSPITAL_COMMUNITY)
Admission: RE | Admit: 2016-01-11 | Discharge: 2016-01-11 | Disposition: A | Discharge status: Home / Self Care | Payer: Medicaid Other | Source: Ambulatory Visit | Attending: Obstetrics | Admitting: Obstetrics

## 2016-01-11 ENCOUNTER — Encounter (HOSPITAL_COMMUNITY): Payer: Self-pay

## 2016-01-11 DIAGNOSIS — O09212 Supervision of pregnancy with history of pre-term labor, second trimester: Secondary | ICD-10-CM | POA: Insufficient documentation

## 2016-01-11 DIAGNOSIS — Z3A21 21 weeks gestation of pregnancy: Secondary | ICD-10-CM | POA: Diagnosis not present

## 2016-01-11 DIAGNOSIS — Z36 Encounter for antenatal screening of mother: Secondary | ICD-10-CM | POA: Diagnosis present

## 2016-01-11 DIAGNOSIS — O09211 Supervision of pregnancy with history of pre-term labor, first trimester: Secondary | ICD-10-CM

## 2016-01-18 ENCOUNTER — Ambulatory Visit (INDEPENDENT_AMBULATORY_CARE_PROVIDER_SITE_OTHER): Payer: Medicaid Other | Admitting: Certified Nurse Midwife

## 2016-01-18 ENCOUNTER — Ambulatory Visit (HOSPITAL_COMMUNITY)
Admission: RE | Admit: 2016-01-18 | Discharge: 2016-01-18 | Disposition: A | Discharge status: Home / Self Care | Payer: Medicaid Other | Source: Ambulatory Visit | Attending: Obstetrics | Admitting: Obstetrics

## 2016-01-18 VITALS — BP 104/71 | HR 67 | Wt 187.0 lb

## 2016-01-18 DIAGNOSIS — O09212 Supervision of pregnancy with history of pre-term labor, second trimester: Secondary | ICD-10-CM | POA: Insufficient documentation

## 2016-01-18 DIAGNOSIS — L719 Rosacea, unspecified: Secondary | ICD-10-CM

## 2016-01-18 DIAGNOSIS — O09211 Supervision of pregnancy with history of pre-term labor, first trimester: Secondary | ICD-10-CM

## 2016-01-18 DIAGNOSIS — L309 Dermatitis, unspecified: Secondary | ICD-10-CM

## 2016-01-18 DIAGNOSIS — Z3A22 22 weeks gestation of pregnancy: Secondary | ICD-10-CM | POA: Diagnosis not present

## 2016-01-18 MED ORDER — PREDNISONE 10 MG (21) PO TBPK: ORAL_TABLET | ORAL | Status: DC

## 2016-01-18 NOTE — Progress Notes (Signed)
Pt received 17p injection at today's visit. Pt tolerated injection well.  Administrations This Visit    hydroxyprogesterone caproate (MAKENA) 250 mg/mL injection 250 mg    Admin Date Action Dose Route Administered By         01/18/2016 Given 250 mg Intramuscular Lanney Gins, CMA

## 2016-01-18 NOTE — Progress Notes (Signed)
Patient ID: Liseth Wann, female   DOB: 06-Jun-1981, 35 y.o.   MRN: [id number]  Chief Complaint  Patient presents with  . Injections    17p, problem-skin    HPI Dezirea Mccollister is a 35 y.o. female.  Currently [redacted]w[redacted]d pregnant.  FHR: 159-160's by doppler.  +FM.  Denies any vaginal bleeding, change in discharge, cramping, contractions or leaking of fluid.  States that her rosacea and eczema are getting worse.  Has been to dermatology recently, due to return to dermatology March 3rd.  Was given a steroid pack towards the beginning of her pregnancy that seemed to help with the eczema and rosacea.  Has had the rash all the way down her neck/chest and all over her face.  States that it burns.  Has tried the clindagel and  clobetasol cream and that it is not helping.  States that she is in pain.   HPI  Past Medical History  Diagnosis Date  . Polycystic ovarian disease   . Psoriasis   . Rosacea     Past Surgical History  Procedure Laterality Date  . Tonsillectomy and adenoidectomy    . Cholecystectomy      Family History  Problem Relation Age of Onset  . Cancer Mother   . Cancer Father   . Cancer Maternal Grandmother     Social History Social History  Substance Use Topics  . Smoking status: Former Games developer  . Smokeless tobacco: Never Used  . Alcohol Use: No    No Known Allergies  Current Outpatient Prescriptions  Medication Sig Dispense Refill  . cyclobenzaprine (FLEXERIL) 10 MG tablet take 1 tablet by mouth three times a day if needed for muscle spasm 30 tablet 0  . docusate sodium (COLACE) 100 MG capsule Take 1 capsule (100 mg total) by mouth 2 (two) times daily. 90 capsule 1  . Doxylamine-Pyridoxine (DICLEGIS) 10-10 MG TBEC Take 2 tablets by mouth at bedtime. 100 tablet 2  . escitalopram (LEXAPRO) 20 MG tablet Take 1 tablet (20 mg total) by mouth daily. (Patient not taking: Reported on 01/04/2016) 30 tablet 12  . fluconazole (DIFLUCAN) 100 MG tablet Take 1 tablet (100  mg total) by mouth once. Repeat dose in 48-72 hour. 3 tablet 0  . gabapentin (NEURONTIN) 600 MG tablet Take 1 tablet (600 mg total) by mouth 3 (three) times daily. 90 tablet 5  . ondansetron (ZOFRAN) 4 MG tablet Take 1 tablet (4 mg total) by mouth daily as needed. (Patient not taking: Reported on 01/04/2016) 30 tablet 1  . polyethylene glycol powder (GLYCOLAX/MIRALAX) powder Take 17 g by mouth daily. 500 g 3  . predniSONE (STERAPRED UNI-PAK 21 TAB) 10 MG (21) TBPK tablet Follow dosing on pack. 21 tablet 0  . Prenat w/o A-FeCbn-DSS-FA-DHA (CITRANATAL HARMONY) 30-1-260 MG CAPS Take 1 tablet by mouth daily. 30 capsule 12  . Prenatal Vit-Fe Fumarate-FA (PRENATAL MULTIVITAMIN) TABS tablet Take 1 tablet by mouth daily at 12 noon. Reported on 01/04/2016     Current Facility-Administered Medications  Medication Dose Route Frequency Provider Last Rate Last Dose  . hydroxyprogesterone caproate (MAKENA) 250 mg/mL injection 250 mg  250 mg Intramuscular Weekly Kourtney Montesinos A Katalea Ucci, CNM   250 mg at 01/18/16 1041    Review of Systems Review of Systems Constitutional: negative for fatigue and weight loss Respiratory: negative for cough and wheezing Cardiovascular: negative for chest pain, fatigue and palpitations Gastrointestinal: negative for abdominal pain and change in bowel habits Genitourinary: pregnant Integument/breast: negative for nipple discharge, +  eczema and rosacea.  Musculoskeletal:negative for myalgias Neurological: negative for gait problems and tremors Behavioral/Psych: negative for abusive relationship, depression Endocrine: negative for temperature intolerance     Blood pressure 104/71, pulse 67, weight 187 lb (84.823 kg), last menstrual period 09/21/2015.  Physical Exam Physical Exam General:   alert  Skin:   no rash or abnormalities  Lungs:   clear to auscultation bilaterally  Heart:   regular rate and rhythm, S1, S2 normal, no murmur, click, rub or gallop  Abdomen:  normal findings:  no organomegaly, soft, non-tender and no hernia, gravid. obese    50% of 15 min visit spent on counseling and coordination of care.   Data Reviewed Previous medical hx, meds, labs  Assessment     Severe Eczema Rosacea  History of PTD/PTL    Plan    No orders of the defined types were placed in this encounter.   Meds ordered this encounter  Medications  . predniSONE (STERAPRED UNI-PAK 21 TAB) 10 MG (21) TBPK tablet    Sig: Follow dosing on pack.    Dispense:  21 tablet    Refill:  0     Possible management options include: lazer treatment, labs Follow up as needed/regularly scheduled ROB appointments.

## 2016-01-24 ENCOUNTER — Other Ambulatory Visit: Payer: Self-pay | Admitting: Obstetrics

## 2016-01-25 ENCOUNTER — Encounter (HOSPITAL_COMMUNITY): Payer: Self-pay

## 2016-01-25 ENCOUNTER — Other Ambulatory Visit (HOSPITAL_COMMUNITY): Payer: Self-pay | Admitting: Maternal and Fetal Medicine

## 2016-01-25 ENCOUNTER — Ambulatory Visit (HOSPITAL_COMMUNITY)
Admission: RE | Admit: 2016-01-25 | Discharge: 2016-01-25 | Disposition: A | Discharge status: Home / Self Care | Payer: Medicaid Other | Source: Ambulatory Visit | Attending: Certified Nurse Midwife | Admitting: Certified Nurse Midwife

## 2016-01-25 ENCOUNTER — Ambulatory Visit (INDEPENDENT_AMBULATORY_CARE_PROVIDER_SITE_OTHER): Payer: Medicaid Other | Admitting: *Deleted

## 2016-01-25 VITALS — BP 122/74 | HR 87 | Temp 98.3°F | Wt 193.0 lb

## 2016-01-25 DIAGNOSIS — O09212 Supervision of pregnancy with history of pre-term labor, second trimester: Secondary | ICD-10-CM | POA: Insufficient documentation

## 2016-01-25 DIAGNOSIS — Z3A23 23 weeks gestation of pregnancy: Secondary | ICD-10-CM | POA: Diagnosis not present

## 2016-01-25 DIAGNOSIS — O4402 Placenta previa specified as without hemorrhage, second trimester: Secondary | ICD-10-CM | POA: Insufficient documentation

## 2016-01-25 DIAGNOSIS — O09211 Supervision of pregnancy with history of pre-term labor, first trimester: Secondary | ICD-10-CM

## 2016-01-25 DIAGNOSIS — Z8751 Personal history of pre-term labor: Secondary | ICD-10-CM

## 2016-01-25 NOTE — Progress Notes (Signed)
Patient in office today for a 17-P injection.  Patient tolerated injection well. Patient to return in one week for next injection.   BP 122/74 mmHg  Pulse 87  Temp(Src) 98.3 F (36.8 C)  Wt 193 lb (87.544 kg)  LMP 09/21/2015 (Approximate)  Administrations This Visit    hydroxyprogesterone caproate (MAKENA) 250 mg/mL injection 250 mg    Admin Date Action Dose Route Administered By         01/25/2016 Given 250 mg Intramuscular Henriette Combs, LPN

## 2016-02-01 ENCOUNTER — Ambulatory Visit (HOSPITAL_COMMUNITY)
Admission: RE | Admit: 2016-02-01 | Discharge: 2016-02-01 | Disposition: A | Discharge status: Home / Self Care | Payer: Medicaid Other | Source: Ambulatory Visit | Attending: Certified Nurse Midwife | Admitting: Certified Nurse Midwife

## 2016-02-01 ENCOUNTER — Ambulatory Visit (INDEPENDENT_AMBULATORY_CARE_PROVIDER_SITE_OTHER): Payer: Medicaid Other | Admitting: Certified Nurse Midwife

## 2016-02-01 ENCOUNTER — Encounter (HOSPITAL_COMMUNITY): Payer: Self-pay

## 2016-02-01 ENCOUNTER — Other Ambulatory Visit (HOSPITAL_COMMUNITY): Payer: Self-pay | Admitting: Maternal and Fetal Medicine

## 2016-02-01 VITALS — BP 107/71 | HR 78 | Wt 194.0 lb

## 2016-02-01 DIAGNOSIS — O09212 Supervision of pregnancy with history of pre-term labor, second trimester: Principal | ICD-10-CM

## 2016-02-01 DIAGNOSIS — Z3A24 24 weeks gestation of pregnancy: Secondary | ICD-10-CM | POA: Diagnosis not present

## 2016-02-01 DIAGNOSIS — O09892 Supervision of other high risk pregnancies, second trimester: Secondary | ICD-10-CM

## 2016-02-01 DIAGNOSIS — O09211 Supervision of pregnancy with history of pre-term labor, first trimester: Secondary | ICD-10-CM

## 2016-02-01 DIAGNOSIS — Z3482 Encounter for supervision of other normal pregnancy, second trimester: Secondary | ICD-10-CM

## 2016-02-01 LAB — POCT URINALYSIS DIPSTICK
BILIRUBIN UA: NEGATIVE
Blood, UA: NEGATIVE
Glucose, UA: NEGATIVE
Ketones, UA: NEGATIVE
Leukocytes, UA: NEGATIVE
NITRITE UA: NEGATIVE
Protein, UA: NEGATIVE
Spec Grav, UA: 1.015
UROBILINOGEN UA: NEGATIVE
pH, UA: 5.5

## 2016-02-01 NOTE — Progress Notes (Signed)
Subjective:    Andrea [name] is a 35 y.o. female being seen today for her obstetrical visit. She is at [redacted]w[redacted]d gestation. Patient reports: no bleeding, no contractions, no cramping, no leaking and facial rash is about the same. Fetal movement: normal.  Patient saw dermatology 01/31/16, they are going to try a non-steroidal cream with her, stated no more steroids this pregnancy d/t weight gain.  Patient had a rough delivery last time due to her cervical position, baby was premature with ruptured cranial veins.    Problem List Items Addressed This Visit    None    Visit Diagnoses    Encounter for supervision of other normal pregnancy in second trimester    -  Primary    Relevant Orders    POCT urinalysis dipstick      Patient Active Problem List   Diagnosis Date Noted  . Supervision of pregnancy with history of pre-term labor in first trimester 10/31/2015   Objective:    BP 107/71 mmHg  Pulse 78  Wt 194 lb (87.998 kg)  LMP 09/21/2015 (Approximate) FHT: 145 BPM  Uterine Size: unable to determine d/t maternal habitus     Assessment:    Pregnancy @ [redacted]w[redacted]d   H/O of PTD: on 17-P Plan:    OBGCT: discussed and ordered for next visit. Signs and symptoms of preterm labor: discussed.  Labs, problem list reviewed and updated 2 hr GTT planned Follow up in 4 weeks.

## 2016-02-01 NOTE — Progress Notes (Signed)
Pt saw dermatology yesterday for skin condition. Pt did have some painful crampiness on Monday.  Pt had u/s with MFM this morning.

## 2016-02-08 ENCOUNTER — Ambulatory Visit (INDEPENDENT_AMBULATORY_CARE_PROVIDER_SITE_OTHER): Payer: Medicaid Other | Admitting: *Deleted

## 2016-02-08 VITALS — BP 113/73 | HR 80 | Wt 196.0 lb

## 2016-02-08 DIAGNOSIS — Z8751 Personal history of pre-term labor: Secondary | ICD-10-CM

## 2016-02-08 DIAGNOSIS — O09212 Supervision of pregnancy with history of pre-term labor, second trimester: Secondary | ICD-10-CM | POA: Diagnosis not present

## 2016-02-08 NOTE — Progress Notes (Signed)
Pt is in office for 17p injection.   Pt states that she had some cramping/ctx last night.  Pt states that she was very irritated emotionally yesterday and thinks that this may have contributed to her pain/symptoms.  Pt states that she felt better and cramping subsided after she was able to rest, lye down and drink fluids.   Pt was advised to continue to monitor her symptoms and if she is regularly having this cramping to call office for appt.  Pt states that she has been having some crampiness over the past few weeks but has been discussed with CNM and MFM.   Pt advised to call office tomorrow for appt if she has the same cramps as last night.  Pt again advised to lay in bed, push fluids if she has symptoms again. Pt states understanding.  Administrations This Visit    hydroxyprogesterone caproate (MAKENA) 250 mg/mL injection 250 mg    Admin Date Action Dose Route Administered By         02/08/2016 Given 250 mg Intramuscular Lanney GinsSuzanne D Tonika Eden, CMA

## 2016-02-15 ENCOUNTER — Ambulatory Visit (INDEPENDENT_AMBULATORY_CARE_PROVIDER_SITE_OTHER): Payer: Medicaid Other | Admitting: *Deleted

## 2016-02-15 ENCOUNTER — Ambulatory Visit: Payer: Medicaid Other

## 2016-02-15 VITALS — BP 115/69 | HR 75 | Temp 98.4°F | Wt 198.0 lb

## 2016-02-15 DIAGNOSIS — O09212 Supervision of pregnancy with history of pre-term labor, second trimester: Secondary | ICD-10-CM

## 2016-02-15 DIAGNOSIS — Z8751 Personal history of pre-term labor: Secondary | ICD-10-CM

## 2016-02-15 NOTE — Progress Notes (Signed)
Patient in office for a 17-P injection.  Patient tolerated injection well.  Patient due for next injection in 1 week.  BP 115/69 mmHg  Pulse 75  Temp(Src) 98.4 F (36.9 C)  Wt 198 lb (89.812 kg)  LMP 09/21/2015 (Approximate)  Administrations This Visit    hydroxyprogesterone caproate (MAKENA) 250 mg/mL injection 250 mg    Admin Date Action Dose Route Administered By         02/15/2016 Given 250 mg Intramuscular Henriette CombsAndrea L Tejuan Gholson, LPN

## 2016-02-20 ENCOUNTER — Inpatient Hospital Stay (HOSPITAL_COMMUNITY)
Admission: AD | Admit: 2016-02-20 | Discharge: 2016-02-20 | Disposition: A | Discharge status: Home / Self Care | Payer: Medicaid Other | Source: Ambulatory Visit | Attending: Obstetrics | Admitting: Obstetrics

## 2016-02-20 ENCOUNTER — Encounter (HOSPITAL_COMMUNITY): Payer: Self-pay | Admitting: *Deleted

## 2016-02-20 DIAGNOSIS — O9989 Other specified diseases and conditions complicating pregnancy, childbirth and the puerperium: Secondary | ICD-10-CM | POA: Diagnosis not present

## 2016-02-20 DIAGNOSIS — O26892 Other specified pregnancy related conditions, second trimester: Secondary | ICD-10-CM | POA: Insufficient documentation

## 2016-02-20 DIAGNOSIS — M5432 Sciatica, left side: Secondary | ICD-10-CM

## 2016-02-20 DIAGNOSIS — Z87891 Personal history of nicotine dependence: Secondary | ICD-10-CM | POA: Insufficient documentation

## 2016-02-20 DIAGNOSIS — T8089XA Other complications following infusion, transfusion and therapeutic injection, initial encounter: Secondary | ICD-10-CM

## 2016-02-20 DIAGNOSIS — Z3A26 26 weeks gestation of pregnancy: Secondary | ICD-10-CM | POA: Insufficient documentation

## 2016-02-20 DIAGNOSIS — M79606 Pain in leg, unspecified: Secondary | ICD-10-CM | POA: Diagnosis present

## 2016-02-20 DIAGNOSIS — R52 Pain, unspecified: Secondary | ICD-10-CM

## 2016-02-20 NOTE — MAU Provider Note (Signed)
History     CSN: [id number]  Arrival date and time: 02/20/16 1426   First Provider Initiated Contact with Patient 02/20/16 1513      Chief Complaint  Patient presents with  . Leg Pain   HPI  Andrea [name] is a 35 y.o. G2P0101 at 3840w6d who presents for leg pain & pain at injection site. Pt receives weekly 17-P injections on Thursdays. Since Friday morning has had shooting pains down her left leg. Occurs when she moves her leg & walks. Denies numbness, tingling, weakness, back pain, or paralysis.  Also states that injection site has a lump & is more painful than normal. Pain normally resolved by Monday after injection but this pain has continued. Denies fever, abdominal pain, vaginal bleeding, or LOF. Positive fetal movement.    OB History    Gravida Para Term Preterm AB TAB SAB Ectopic Multiple Living   2 1 0 1 0 0 0 0 0 1       Past Medical History  Diagnosis Date  . Polycystic ovarian disease   . Psoriasis   . Rosacea     Past Surgical History  Procedure Laterality Date  . Tonsillectomy and adenoidectomy    . Cholecystectomy      Family History  Problem Relation Age of Onset  . Cancer Mother   . Cancer Father   . Cancer Maternal Grandmother     Social History  Substance Use Topics  . Smoking status: Former Games developermoker  . Smokeless tobacco: Never Used  . Alcohol Use: No    Allergies:  Allergies  Allergen Reactions  . Codeine Nausea And Vomiting  . Hydrocodone Itching    itching  . Morphine And Related Nausea And Vomiting    Facility-administered medications prior to admission  Medication Dose Route Frequency Provider Last Rate Last Dose  . hydroxyprogesterone caproate (MAKENA) 250 mg/mL injection 250 mg  250 mg Intramuscular Weekly Rachelle A Denney, CNM   250 mg at 02/15/16 [id number]   Prescriptions prior to admission  Medication Sig Dispense Refill Last Dose  . cyclobenzaprine (FLEXERIL) 10 MG tablet take 1 tablet by mouth three times a day if needed  for muscle spasm 30 tablet 0 Taking  . docusate sodium (COLACE) 100 MG capsule Take 1 capsule (100 mg total) by mouth 2 (two) times daily. 90 capsule 1 Taking  . Doxylamine-Pyridoxine (DICLEGIS) 10-10 MG TBEC Take 2 tablets by mouth at bedtime. 100 tablet 2 Taking  . escitalopram (LEXAPRO) 20 MG tablet Take 1 tablet (20 mg total) by mouth daily. (Patient not taking: Reported on 01/04/2016) 30 tablet 12 Not Taking  . fluconazole (DIFLUCAN) 100 MG tablet Take 1 tablet (100 mg total) by mouth once. Repeat dose in 48-72 hour. (Patient not taking: Reported on 01/25/2016) 3 tablet 0 Not Taking  . gabapentin (NEURONTIN) 600 MG tablet Take 1 tablet (600 mg total) by mouth 3 (three) times daily. 90 tablet 5 Taking  . ondansetron (ZOFRAN) 4 MG tablet Take 1 tablet (4 mg total) by mouth daily as needed. (Patient not taking: Reported on 01/04/2016) 30 tablet 1 Not Taking  . polyethylene glycol powder (GLYCOLAX/MIRALAX) powder Take 17 g by mouth daily. 500 g 3 Taking  . predniSONE (STERAPRED UNI-PAK 21 TAB) 10 MG (21) TBPK tablet Follow dosing on pack. (Patient not taking: Reported on 01/25/2016) 21 tablet 0 Not Taking  . Prenat w/o A-FeCbn-DSS-FA-DHA (CITRANATAL HARMONY) 30-1-260 MG CAPS Take 1 tablet by mouth daily. 30 capsule 12 Taking  . Prenatal Vit-Fe  Fumarate-FA (PRENATAL MULTIVITAMIN) TABS tablet Take 1 tablet by mouth daily at 12 noon. Reported on 02/01/2016   Not Taking    Review of Systems  Constitutional: Negative.   Gastrointestinal: Negative.   Genitourinary: Negative.   Musculoskeletal: Negative for back pain.       + shooting pain that radiates down left leg  Neurological: Negative for tingling and focal weakness.   Physical Exam   Blood pressure 130/73, temperature 98.2 F (36.8 C), resp. rate 18, height  (1.702 m), weight 201 lb 3.2 oz (91.264 kg), last menstrual period 09/21/2015.  Physical Exam  Nursing note and vitals reviewed. Constitutional: She is oriented to person, place, and  time. She appears well-developed and well-nourished. No distress.  HENT:  Head: Normocephalic and atraumatic.  Eyes: Conjunctivae are normal. Right eye exhibits no discharge. Left eye exhibits no discharge. No scleral icterus.  Neck: Normal range of motion.  Cardiovascular:  No murmur heard. Respiratory: Effort normal. No respiratory distress.  Musculoskeletal: She exhibits no edema or tenderness.       Left upper leg: Normal.       Left lower leg: Normal.       Left foot: Normal.  Neurological: She is alert and oriented to person, place, and time. She has normal strength and normal reflexes. She exhibits normal muscle tone.  Skin: Skin is warm and dry. She is not diaphoretic.  ~1 cm mass gluteal upper outer quadrant of left buttocks. Firm & moveable. No erythema, not hot to touch.  Tender to palpation.   Psychiatric: She has a normal mood and affect. Her behavior is normal. Judgment and thought content normal.   Fetal Tracing:  Baseline: 155 Variability: moderate Accelerations: 10x10 Decelerations: none  Toco: none   MAU Course  Procedures  MDM Category 1 tracing, no contractions No evidence of infection at injection site Heat applied to injection site S/w Dr. Clearance Coots. Patient ok to discharge home. He will reassess her at her next appt on Thursday.  Assessment and Plan  A: 1. Sciatica of left side without back pain   2. Pain at injection site, initial encounter     P: Discharge home Apply heat & massage injection site Recommend maternity support belt Take flexeril as prescribed Keep f/u with Dr. Inge Rise 02/20/2016, 3:27 PM

## 2016-02-20 NOTE — MAU Note (Signed)
Have 17P every THurs. I get sore but usually better by Monday. Have a small know where received inj and hurts down my L leg

## 2016-02-20 NOTE — MAU Note (Signed)
EFM reviewed and removed by Estanislado SpireE. Lawrence NP

## 2016-02-20 NOTE — Progress Notes (Signed)
Written and verbal d/c instructions given and understanding voiced. 

## 2016-02-20 NOTE — Discharge Instructions (Signed)
Maternity support belt    Sciatica Sciatica is pain, weakness, numbness, or tingling along the path of the sciatic nerve. The nerve starts in the lower back and runs down the back of each leg. The nerve controls the muscles in the lower leg and in the back of the knee, while also providing sensation to the back of the thigh, lower leg, and the sole of your foot. Sciatica is a symptom of another medical condition. For instance, nerve damage or certain conditions, such as a herniated disk or bone spur on the spine, pinch or put pressure on the sciatic nerve. This causes the pain, weakness, or other sensations normally associated with sciatica. Generally, sciatica only affects one side of the body. CAUSES   Herniated or slipped disc.  Degenerative disk disease.  A pain disorder involving the narrow muscle in the buttocks (piriformis syndrome).  Pelvic injury or fracture.  Pregnancy.  Tumor (rare). SYMPTOMS  Symptoms can vary from mild to very severe. The symptoms usually travel from the low back to the buttocks and down the back of the leg. Symptoms can include:  Mild tingling or dull aches in the lower back, leg, or hip.  Numbness in the back of the calf or sole of the foot.  Burning sensations in the lower back, leg, or hip.  Sharp pains in the lower back, leg, or hip.  Leg weakness.  Severe back pain inhibiting movement. These symptoms may get worse with coughing, sneezing, laughing, or prolonged sitting or standing. Also, being overweight may worsen symptoms. DIAGNOSIS  Your caregiver will perform a physical exam to look for common symptoms of sciatica. He or she may ask you to do certain movements or activities that would trigger sciatic nerve pain. Other tests may be performed to find the cause of the sciatica. These may include:  Blood tests.  X-rays.  Imaging tests, such as an MRI or CT scan. TREATMENT  Treatment is directed at the cause of the sciatic pain. Sometimes,  treatment is not necessary and the pain and discomfort goes away on its own. If treatment is needed, your caregiver may suggest:  Over-the-counter medicines to relieve pain.  Prescription medicines, such as anti-inflammatory medicine, muscle relaxants, or narcotics.  Applying heat or ice to the painful area.  Steroid injections to lessen pain, irritation, and inflammation around the nerve.  Reducing activity during periods of pain.  Exercising and stretching to strengthen your abdomen and improve flexibility of your spine. Your caregiver may suggest losing weight if the extra weight makes the back pain worse.  Physical therapy.  Surgery to eliminate what is pressing or pinching the nerve, such as a bone spur or part of a herniated disk. HOME CARE INSTRUCTIONS   Only take over-the-counter or prescription medicines for pain or discomfort as directed by your caregiver.  Apply ice to the affected area for 20 minutes, 3-4 times a day for the first 48-72 hours. Then try heat in the same way.  Exercise, stretch, or perform your usual activities if these do not aggravate your pain.  Attend physical therapy sessions as directed by your caregiver.  Keep all follow-up appointments as directed by your caregiver.  Do not wear high heels or shoes that do not provide proper support.  Check your mattress to see if it is too soft. A firm mattress may lessen your pain and discomfort. SEEK IMMEDIATE MEDICAL CARE IF:   You lose control of your bowel or bladder (incontinence).  You have increasing weakness  in the lower back, pelvis, buttocks, or legs.  You have redness or swelling of your back.  You have a burning sensation when you urinate.  You have pain that gets worse when you lie down or awakens you at night.  Your pain is worse than you have experienced in the past.  Your pain is lasting longer than 4 weeks.  You are suddenly losing weight without reason. MAKE SURE  YOU:  Understand these instructions.  Will watch your condition.  Will get help right away if you are not doing well or get worse.   This information is not intended to replace advice given to you by your health care provider. Make sure you discuss any questions you have with your health care provider.   Document Released: 11/12/2001 Document Revised: 08/09/2015 Document Reviewed: 03/29/2012 Elsevier Interactive Patient Education [employer]2016 Elsevier Inc.

## 2016-02-21 ENCOUNTER — Other Ambulatory Visit: Payer: Self-pay | Admitting: Obstetrics

## 2016-02-22 ENCOUNTER — Ambulatory Visit (INDEPENDENT_AMBULATORY_CARE_PROVIDER_SITE_OTHER): Payer: Medicaid Other | Admitting: *Deleted

## 2016-02-22 VITALS — BP 109/73 | HR 78 | Wt 198.0 lb

## 2016-02-22 DIAGNOSIS — Z8751 Personal history of pre-term labor: Secondary | ICD-10-CM

## 2016-02-22 DIAGNOSIS — O09212 Supervision of pregnancy with history of pre-term labor, second trimester: Secondary | ICD-10-CM | POA: Diagnosis not present

## 2016-02-22 NOTE — Progress Notes (Signed)
Pt is in office today for 17p injection.  Pt is doing well.  [facility]   Pt was complaining of small not and pain down leg after last injection.  Pt states that problems resolving.  Pt advised to call office if she still has complaints or any new symptoms after today's injection. Injection was given today, pt tolerated well.  BP 109/73 mmHg  Pulse 78  Wt 198 lb (89.812 kg)  LMP 09/21/2015 (Approximate)  Administrations This Visit    hydroxyprogesterone caproate (MAKENA) 250 mg/mL injection 250 mg    Admin Date Action Dose Route Administered By         02/22/2016 Given 250 mg Intramuscular Lanney GinsSuzanne D Kenaz Olafson, CMA

## 2016-02-26 ENCOUNTER — Other Ambulatory Visit: Payer: Self-pay | Admitting: Certified Nurse Midwife

## 2016-02-28 ENCOUNTER — Other Ambulatory Visit: Payer: Medicaid Other

## 2016-02-28 ENCOUNTER — Ambulatory Visit (INDEPENDENT_AMBULATORY_CARE_PROVIDER_SITE_OTHER): Payer: Medicaid Other | Admitting: Certified Nurse Midwife

## 2016-02-28 VITALS — BP 108/67 | HR 77 | Temp 98.1°F | Wt 203.0 lb

## 2016-02-28 DIAGNOSIS — O09213 Supervision of pregnancy with history of pre-term labor, third trimester: Secondary | ICD-10-CM

## 2016-02-28 DIAGNOSIS — O09893 Supervision of other high risk pregnancies, third trimester: Secondary | ICD-10-CM

## 2016-02-28 LAB — POCT URINALYSIS DIPSTICK
BILIRUBIN UA: NEGATIVE
GLUCOSE UA: NEGATIVE
Ketones, UA: NEGATIVE
Leukocytes, UA: NEGATIVE
Nitrite, UA: NEGATIVE
PH UA: 6.5
Protein, UA: NEGATIVE
RBC UA: NEGATIVE
SPEC GRAV UA: 1.01
UROBILINOGEN UA: NEGATIVE

## 2016-02-28 NOTE — Progress Notes (Signed)
Subjective:    Andrea [name] is a 35 y.o. female being seen today for her obstetrical visit. She is at 338w0d gestation. Patient reports no complaints. Fetal movement: normal.  Problem List Items Addressed This Visit    None    Visit Diagnoses    History of preterm delivery, currently pregnant in third trimester    -  Primary    Relevant Orders    Glucose Tolerance, 2 Hours w/1 Hour    CBC    HIV antibody    RPR    POCT urinalysis dipstick      Patient Active Problem List   Diagnosis Date Noted  . Supervision of pregnancy with history of pre-term labor in first trimester 10/31/2015   Objective:    BP 108/67 mmHg  Pulse 77  Temp(Src) 98.1 F (36.7 C)  Wt 203 lb (92.08 kg)  LMP 09/21/2015 (Approximate) FHT:  145 BPM  Uterine Size: unable to determine d/t body habitus  Presentation: cephalic     Assessment:    Pregnancy @ 238w0d weeks   H/O preterm delivery: on 17-P  Plan:     labs reviewed, problem list updated Consent signed. GBS planning TDAP offered  Rhogam given for RH negative Pediatrician: discussed. Infant feeding: plans to breastfeed. Maternity leave: discussed. Cigarette smoking: never smoked. Orders Placed This Encounter  Procedures  . Glucose Tolerance, 2 Hours w/1 Hour  . CBC  . HIV antibody  . RPR  . POCT urinalysis dipstick   No orders of the defined types were placed in this encounter.   Follow up in 2 Weeks.

## 2016-02-29 ENCOUNTER — Encounter: Payer: Medicaid Other | Admitting: Certified Nurse Midwife

## 2016-02-29 LAB — GLUCOSE TOLERANCE, 2 HOURS W/ 1HR
Glucose, 1 hour: 167 mg/dL (ref 65–179)
Glucose, 2 hour: 93 mg/dL (ref 65–152)
Glucose, Fasting: 74 mg/dL (ref 65–91)

## 2016-02-29 LAB — CBC
HEMATOCRIT: 36.3 % (ref 34.0–46.6)
HEMOGLOBIN: 12.2 g/dL (ref 11.1–15.9)
MCH: 30.8 pg (ref 26.6–33.0)
MCHC: 33.6 g/dL (ref 31.5–35.7)
MCV: 92 fL (ref 79–97)
Platelets: 198 10*3/uL (ref 150–379)
RBC: 3.96 x10E6/uL (ref 3.77–5.28)
RDW: 14 % (ref 12.3–15.4)
WBC: 11.1 10*3/uL — AB (ref 3.4–10.8)

## 2016-02-29 LAB — HIV ANTIBODY (ROUTINE TESTING W REFLEX): HIV Screen 4th Generation wRfx: NONREACTIVE

## 2016-02-29 LAB — RPR: RPR Ser Ql: NONREACTIVE

## 2016-03-07 ENCOUNTER — Ambulatory Visit (INDEPENDENT_AMBULATORY_CARE_PROVIDER_SITE_OTHER): Payer: Medicaid Other | Admitting: *Deleted

## 2016-03-07 VITALS — BP 109/74 | HR 81 | Wt 205.0 lb

## 2016-03-07 DIAGNOSIS — O09893 Supervision of other high risk pregnancies, third trimester: Secondary | ICD-10-CM

## 2016-03-07 DIAGNOSIS — O09213 Supervision of pregnancy with history of pre-term labor, third trimester: Secondary | ICD-10-CM

## 2016-03-07 NOTE — Progress Notes (Signed)
Pt is in office for 17p injection. Pt tolerated injection well.  Pt has appt scheduled for next week. Pt has no other concerns today.  BP 109/74 mmHg  Pulse 81  Wt 205 lb (92.987 kg)  LMP 09/21/2015 (Approximate)  Administrations This Visit    hydroxyprogesterone caproate (MAKENA) 250 mg/mL injection 250 mg    Admin Date Action Dose Route Administered By         03/07/2016 Given 250 mg Intramuscular Lanney GinsSuzanne D Love Chowning, CMA

## 2016-03-14 ENCOUNTER — Ambulatory Visit (INDEPENDENT_AMBULATORY_CARE_PROVIDER_SITE_OTHER): Payer: Medicaid Other | Admitting: Certified Nurse Midwife

## 2016-03-14 VITALS — BP 128/80 | HR 94 | Wt 210.0 lb

## 2016-03-14 DIAGNOSIS — O09213 Supervision of pregnancy with history of pre-term labor, third trimester: Secondary | ICD-10-CM

## 2016-03-14 DIAGNOSIS — O0993 Supervision of high risk pregnancy, unspecified, third trimester: Secondary | ICD-10-CM

## 2016-03-14 LAB — POCT URINALYSIS DIPSTICK
Bilirubin, UA: NEGATIVE
Ketones, UA: NEGATIVE
Leukocytes, UA: NEGATIVE
Nitrite, UA: NEGATIVE
PROTEIN UA: NEGATIVE
RBC UA: NEGATIVE
SPEC GRAV UA: 1.02
UROBILINOGEN UA: NEGATIVE
pH, UA: 5

## 2016-03-14 NOTE — Progress Notes (Signed)
Pt is having increase in pelvic pressure. 

## 2016-03-14 NOTE — Progress Notes (Signed)
Subjective:    Nahjae Marie [place] is a 35 y.o. female being seen today for her obstetrical visit. She is at 6844w1d gestation. Patient reports backache, no bleeding, no contractions, no cramping, no leaking and pelvic pressure, occasionally. Fetal movement: normal.  Problem List Items Addressed This Visit    None    Visit Diagnoses    Supervision of high risk pregnancy in third trimester    -  Primary    Relevant Orders    POCT urinalysis dipstick (Completed)    US MFM OB FOLLOW UP      Patient Active Problem List   Diagnosis Date Noted  . Supervision of pregnancy with history of pre-term labor in first trimester 10/31/2015   Objective:    BP 128/80 mmHg  Pulse 94  Wt 210 lb (95.255 kg)  LMP 09/21/2015 (Approximate) FHT:  150 BPM  Uterine Size: 34 cm and size greater than dates  Presentation: unsure     Assessment:    Pregnancy @ 244w1d weeks   S>D  Plan:     labs reviewed, problem list updated Consent signed. GBS planning TDAP offered  Rhogam given for RH negative Pediatrician: discussed. Infant feeding: plans to breastfeed. Maternity leave: discussed. Cigarette smoking: quit at start of pregnancy. Orders Placed This Encounter  Procedures  . US MFM OB FOLLOW UP    Standing Status: Future     Number of Occurrences:      Standing Expiration Date: 05/14/2017    Order Specific Question:  Reason for Exam (SYMPTOM  OR DIAGNOSIS REQUIRED)    Answer:  growth, hx of PTB, on 17-P    Order Specific Question:  Preferred imaging location?    Answer:  [facility]  . POCT urinalysis dipstick   No orders of the defined types were placed in this encounter.   Follow up in 2 Weeks.

## 2016-03-20 ENCOUNTER — Other Ambulatory Visit: Payer: Self-pay | Admitting: Certified Nurse Midwife

## 2016-03-21 ENCOUNTER — Encounter (HOSPITAL_COMMUNITY): Payer: Self-pay

## 2016-03-21 ENCOUNTER — Ambulatory Visit (HOSPITAL_COMMUNITY): Payer: Medicaid Other

## 2016-03-21 ENCOUNTER — Ambulatory Visit (HOSPITAL_COMMUNITY)
Admission: RE | Admit: 2016-03-21 | Discharge: 2016-03-21 | Disposition: A | Discharge status: Home / Self Care | Payer: Medicaid Other | Source: Ambulatory Visit | Attending: Certified Nurse Midwife | Admitting: Certified Nurse Midwife

## 2016-03-21 ENCOUNTER — Ambulatory Visit (INDEPENDENT_AMBULATORY_CARE_PROVIDER_SITE_OTHER): Payer: Medicaid Other | Admitting: *Deleted

## 2016-03-21 ENCOUNTER — Other Ambulatory Visit: Payer: Self-pay | Admitting: Certified Nurse Midwife

## 2016-03-21 VITALS — BP 117/79 | HR 75 | Wt 209.0 lb

## 2016-03-21 DIAGNOSIS — O26843 Uterine size-date discrepancy, third trimester: Secondary | ICD-10-CM | POA: Diagnosis not present

## 2016-03-21 DIAGNOSIS — Z3A31 31 weeks gestation of pregnancy: Secondary | ICD-10-CM | POA: Insufficient documentation

## 2016-03-21 DIAGNOSIS — O09213 Supervision of pregnancy with history of pre-term labor, third trimester: Secondary | ICD-10-CM | POA: Diagnosis not present

## 2016-03-21 DIAGNOSIS — O0993 Supervision of high risk pregnancy, unspecified, third trimester: Secondary | ICD-10-CM

## 2016-03-21 DIAGNOSIS — O4103X0 Oligohydramnios, third trimester, not applicable or unspecified: Secondary | ICD-10-CM

## 2016-03-21 DIAGNOSIS — O09893 Supervision of other high risk pregnancies, third trimester: Secondary | ICD-10-CM

## 2016-03-21 NOTE — Progress Notes (Signed)
Pt is in office for 17p injection.  Pt states that she is doing well.  Pt was given injection, tolerated well.  Pt has no other concerns today.  BP 117/79 mmHg  Pulse 75  Wt 209 lb (94.802 kg)  LMP 09/21/2015 (Approximate)  Administrations This Visit    hydroxyprogesterone caproate (MAKENA) 250 mg/mL injection 250 mg    Admin Date Action Dose Route Administered By         03/21/2016 Given 250 mg Intramuscular Lanney GinsSuzanne D Foster, CMA

## 2016-03-28 ENCOUNTER — Ambulatory Visit (HOSPITAL_COMMUNITY)
Admission: RE | Admit: 2016-03-28 | Discharge: 2016-03-28 | Disposition: A | Discharge status: Home / Self Care | Payer: Medicaid Other | Source: Ambulatory Visit | Attending: Certified Nurse Midwife | Admitting: Certified Nurse Midwife

## 2016-03-28 ENCOUNTER — Ambulatory Visit (INDEPENDENT_AMBULATORY_CARE_PROVIDER_SITE_OTHER): Payer: Medicaid Other | Admitting: Certified Nurse Midwife

## 2016-03-28 ENCOUNTER — Encounter (HOSPITAL_COMMUNITY): Payer: Self-pay

## 2016-03-28 ENCOUNTER — Other Ambulatory Visit (HOSPITAL_COMMUNITY): Payer: Self-pay | Admitting: Maternal and Fetal Medicine

## 2016-03-28 VITALS — BP 115/82 | HR 82 | Wt 210.0 lb

## 2016-03-28 DIAGNOSIS — O09893 Supervision of other high risk pregnancies, third trimester: Secondary | ICD-10-CM

## 2016-03-28 DIAGNOSIS — O26843 Uterine size-date discrepancy, third trimester: Secondary | ICD-10-CM | POA: Diagnosis not present

## 2016-03-28 DIAGNOSIS — Z3A32 32 weeks gestation of pregnancy: Secondary | ICD-10-CM | POA: Diagnosis not present

## 2016-03-28 DIAGNOSIS — O4103X0 Oligohydramnios, third trimester, not applicable or unspecified: Secondary | ICD-10-CM | POA: Insufficient documentation

## 2016-03-28 DIAGNOSIS — O09213 Supervision of pregnancy with history of pre-term labor, third trimester: Secondary | ICD-10-CM | POA: Insufficient documentation

## 2016-03-28 DIAGNOSIS — Z3483 Encounter for supervision of other normal pregnancy, third trimester: Secondary | ICD-10-CM

## 2016-03-28 LAB — POCT URINALYSIS DIPSTICK
BILIRUBIN UA: NEGATIVE
Glucose, UA: NEGATIVE
KETONES UA: NEGATIVE
Leukocytes, UA: NEGATIVE
Nitrite, UA: NEGATIVE
PH UA: 6.5
Protein, UA: NEGATIVE
RBC UA: NEGATIVE
Spec Grav, UA: <=1.005
Urobilinogen, UA: NEGATIVE

## 2016-03-28 MED ORDER — CYCLOBENZAPRINE HCL 10 MG PO TABS: ORAL_TABLET | ORAL | Status: DC

## 2016-03-28 NOTE — ED Notes (Signed)
Pt reports increased pelvic pressure.

## 2016-03-28 NOTE — Progress Notes (Signed)
Subjective:    Andrea [name] is a 35 y.o. female being seen today for her obstetrical visit. She is at 6119w1d gestation. Patient reports backache, no bleeding, no contractions, no leaking and vaginal pressure. Fetal movement: normal.  Problem List Items Addressed This Visit    None    Visit Diagnoses    Encounter for supervision of other normal pregnancy in third trimester    -  Primary    Relevant Orders    POCT urinalysis dipstick      Patient Active Problem List   Diagnosis Date Noted  . Supervision of pregnancy with history of pre-term labor in first trimester 10/31/2015   Objective:    BP 115/82 mmHg  Pulse 82  Wt 210 lb (95.255 kg)  LMP 09/21/2015 (Approximate) FHT:  140 BPM  Uterine Size: unable to determine d/t body habitus  Presentation: breech   Cervix: long, thick, closed and anterior.    Assessment:    Pregnancy @ 719w1d weeks   H/O of PPROM @33  weeks  On 17-P  Plan:     labs reviewed, problem list updated Consent signed. GBS planning TDAP offered  Rhogam given for RH negative Pediatrician: discussed. Infant feeding: plans to breastfeed. Maternity leave: discussed. Cigarette smoking: never smoked. Orders Placed This Encounter  Procedures  . POCT urinalysis dipstick   No orders of the defined types were placed in this encounter.   Follow up in 2 Weeks with GBS.

## 2016-03-28 NOTE — Addendum Note (Signed)
Addended by: Marya LandryFOSTER, Janelly Switalski D on: 03/28/2016 01:10 PM   Modules accepted: Orders

## 2016-03-28 NOTE — Addendum Note (Signed)
Addended by: Marya LandryFOSTER, Daryus Sowash D on: 03/28/2016 12:29 PM   Modules accepted: Orders

## 2016-04-01 LAB — NUSWAB VAGINITIS PLUS (VG+)
Candida albicans, NAA: NEGATIVE
Candida glabrata, NAA: POSITIVE — AB
Chlamydia trachomatis, NAA: NEGATIVE
Neisseria gonorrhoeae, NAA: NEGATIVE
TRICH VAG BY NAA: NEGATIVE

## 2016-04-02 ENCOUNTER — Other Ambulatory Visit: Payer: Self-pay | Admitting: Certified Nurse Midwife

## 2016-04-02 ENCOUNTER — Other Ambulatory Visit (HOSPITAL_COMMUNITY): Payer: Self-pay | Admitting: *Deleted

## 2016-04-02 DIAGNOSIS — B3731 Acute candidiasis of vulva and vagina: Secondary | ICD-10-CM

## 2016-04-02 DIAGNOSIS — O4100X0 Oligohydramnios, unspecified trimester, not applicable or unspecified: Secondary | ICD-10-CM

## 2016-04-02 DIAGNOSIS — B373 Candidiasis of vulva and vagina: Secondary | ICD-10-CM

## 2016-04-02 MED ORDER — TERCONAZOLE 0.4 % VA CREA: 1.0000 | TOPICAL_CREAM | Freq: Every day | VAGINAL | Status: DC

## 2016-04-02 MED ORDER — FLUCONAZOLE 100 MG PO TABS: 100.0000 mg | ORAL_TABLET | Freq: Once | ORAL | Status: DC

## 2016-04-04 ENCOUNTER — Ambulatory Visit (HOSPITAL_COMMUNITY)
Admission: RE | Admit: 2016-04-04 | Discharge: 2016-04-04 | Disposition: A | Discharge status: Home / Self Care | Payer: Medicaid Other | Source: Ambulatory Visit | Attending: Certified Nurse Midwife | Admitting: Certified Nurse Midwife

## 2016-04-04 ENCOUNTER — Encounter (HOSPITAL_COMMUNITY): Payer: Self-pay

## 2016-04-04 ENCOUNTER — Ambulatory Visit: Payer: Medicaid Other

## 2016-04-04 DIAGNOSIS — O09213 Supervision of pregnancy with history of pre-term labor, third trimester: Secondary | ICD-10-CM | POA: Insufficient documentation

## 2016-04-04 DIAGNOSIS — Z3A33 33 weeks gestation of pregnancy: Secondary | ICD-10-CM | POA: Diagnosis not present

## 2016-04-04 DIAGNOSIS — O4103X0 Oligohydramnios, third trimester, not applicable or unspecified: Secondary | ICD-10-CM | POA: Diagnosis not present

## 2016-04-04 DIAGNOSIS — O26843 Uterine size-date discrepancy, third trimester: Secondary | ICD-10-CM | POA: Diagnosis not present

## 2016-04-04 NOTE — Progress Notes (Unsigned)
Patient tolerated 17P injection well in LUOQ. 

## 2016-04-11 ENCOUNTER — Ambulatory Visit (INDEPENDENT_AMBULATORY_CARE_PROVIDER_SITE_OTHER): Payer: Medicaid Other | Admitting: Certified Nurse Midwife

## 2016-04-11 ENCOUNTER — Ambulatory Visit: Payer: Medicaid Other

## 2016-04-11 ENCOUNTER — Ambulatory Visit (HOSPITAL_COMMUNITY): Payer: Medicaid Other

## 2016-04-11 VITALS — BP 115/76 | HR 82 | Wt 216.0 lb

## 2016-04-11 DIAGNOSIS — O09213 Supervision of pregnancy with history of pre-term labor, third trimester: Secondary | ICD-10-CM | POA: Diagnosis not present

## 2016-04-11 DIAGNOSIS — R0981 Nasal congestion: Secondary | ICD-10-CM

## 2016-04-11 DIAGNOSIS — Z3483 Encounter for supervision of other normal pregnancy, third trimester: Secondary | ICD-10-CM

## 2016-04-11 LAB — POCT URINALYSIS DIPSTICK
Bilirubin, UA: NEGATIVE
Blood, UA: NEGATIVE
Glucose, UA: NEGATIVE
KETONES UA: NEGATIVE
LEUKOCYTES UA: NEGATIVE
Nitrite, UA: NEGATIVE
PH UA: 5
PROTEIN UA: NEGATIVE
Spec Grav, UA: 1.025
UROBILINOGEN UA: NEGATIVE

## 2016-04-11 LAB — FETAL FIBRONECTIN: FETAL FIBRONECTIN: POSITIVE — AB

## 2016-04-11 MED ORDER — FLUTICASONE PROPIONATE 50 MCG/ACT NA SUSP: 2.0000 | Freq: Every day | NASAL | Status: DC

## 2016-04-11 NOTE — Progress Notes (Signed)
Pt has been having some cramping.

## 2016-04-12 NOTE — Progress Notes (Signed)
Subjective:    Andrea [name] is a 35 y.o. female being seen today for her obstetrical visit. She is at 692w2d gestation. Patient reports no bleeding, no leaking and occasional contractions.  States that she feels like she did when she went into labor with her first child at 5233 weeks.  Fetal movement: normal.  Problem List Items Addressed This Visit    None    Visit Diagnoses    Encounter for supervision of other normal pregnancy in third trimester    -  Primary    Relevant Orders    POCT urinalysis dipstick (Completed)    Fetal fibronectin (Completed)    NuSwab VG+, Candida 6sp    Nasal congestion        Relevant Medications    fluticasone (FLONASE) 50 MCG/ACT nasal spray      Patient Active Problem List   Diagnosis Date Noted  . Supervision of pregnancy with history of pre-term labor in first trimester 10/31/2015   Objective:    BP 115/76 mmHg  Pulse 82  Wt 216 lb (97.977 kg)  LMP 09/21/2015 (Approximate) FHT:  150 BPM  Uterine Size: unable to determine d/t body habitus  Presentation: unsure   Cervix: 1cm, long, anterior, soft, ballotable.    NST: +accels, no decels, moderate variability, cat. 1 tracing, occasional contractions noted.    +FFN, patient notified  Assessment:    Pregnancy @ 702w2d weeks   Reactive NST  +FFN  17-P given today   Allergic rhinitis   HX of narrow pelvis Plan:     labs reviewed, problem list updated Consent signed. GBS planning TDAP offered  Rhogam given for RH negative Pediatrician: discussed. Infant feeding: plans to breastfeed. Maternity leave: discussed. Cigarette smoking: quit at start of pregnancy. Orders Placed This Encounter  Procedures  . Fetal fibronectin  . POCT urinalysis dipstick   Meds ordered this encounter  Medications  . fluticasone (FLONASE) 50 MCG/ACT nasal spray    Sig: Place 2 sprays into both nostrils daily.    Dispense:  16 g    Refill:  2   Follow up in 1 Week with Dr. Clearance CootsHarper to discuss primary  C-section for hx of traumatic delivery with preterm infant in the past.  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 Dr. Clearance CootsHarper notified of previous difficulty with birth

## 2016-04-16 ENCOUNTER — Telehealth: Payer: Self-pay | Admitting: *Deleted

## 2016-04-16 ENCOUNTER — Other Ambulatory Visit: Payer: Self-pay | Admitting: Certified Nurse Midwife

## 2016-04-16 NOTE — Telephone Encounter (Signed)
Patient requested call back- no reason given 4:40 LM on VM to CB

## 2016-04-17 ENCOUNTER — Other Ambulatory Visit: Payer: Self-pay | Admitting: Certified Nurse Midwife

## 2016-04-17 LAB — NUSWAB VG+, CANDIDA 6SP
CANDIDA KRUSEI, NAA: NEGATIVE
CANDIDA LUSITANIAE, NAA: NEGATIVE
Candida albicans, NAA: NEGATIVE
Candida glabrata, NAA: POSITIVE — AB
Candida parapsilosis, NAA: NEGATIVE
Candida tropicalis, NAA: NEGATIVE
Chlamydia trachomatis, NAA: NEGATIVE
NEISSERIA GONORRHOEAE, NAA: NEGATIVE
TRICH VAG BY NAA: NEGATIVE

## 2016-04-18 ENCOUNTER — Ambulatory Visit: Payer: Medicaid Other

## 2016-04-18 ENCOUNTER — Ambulatory Visit (INDEPENDENT_AMBULATORY_CARE_PROVIDER_SITE_OTHER): Payer: Medicaid Other | Admitting: Obstetrics

## 2016-04-18 ENCOUNTER — Other Ambulatory Visit: Payer: Self-pay | Admitting: *Deleted

## 2016-04-18 ENCOUNTER — Ambulatory Visit (HOSPITAL_COMMUNITY): Payer: Medicaid Other

## 2016-04-18 ENCOUNTER — Other Ambulatory Visit: Payer: Self-pay | Admitting: Certified Nurse Midwife

## 2016-04-18 ENCOUNTER — Encounter: Payer: Self-pay | Admitting: Obstetrics

## 2016-04-18 VITALS — BP 128/80 | Wt 222.0 lb

## 2016-04-18 DIAGNOSIS — J0100 Acute maxillary sinusitis, unspecified: Secondary | ICD-10-CM

## 2016-04-18 DIAGNOSIS — O09219 Supervision of pregnancy with history of pre-term labor, unspecified trimester: Secondary | ICD-10-CM

## 2016-04-18 DIAGNOSIS — J0110 Acute frontal sinusitis, unspecified: Secondary | ICD-10-CM

## 2016-04-18 DIAGNOSIS — M549 Dorsalgia, unspecified: Principal | ICD-10-CM

## 2016-04-18 DIAGNOSIS — K219 Gastro-esophageal reflux disease without esophagitis: Secondary | ICD-10-CM

## 2016-04-18 DIAGNOSIS — Z3493 Encounter for supervision of normal pregnancy, unspecified, third trimester: Secondary | ICD-10-CM

## 2016-04-18 DIAGNOSIS — O09213 Supervision of pregnancy with history of pre-term labor, third trimester: Secondary | ICD-10-CM

## 2016-04-18 DIAGNOSIS — O99613 Diseases of the digestive system complicating pregnancy, third trimester: Secondary | ICD-10-CM

## 2016-04-18 DIAGNOSIS — G8929 Other chronic pain: Secondary | ICD-10-CM

## 2016-04-18 LAB — POCT URINALYSIS DIPSTICK
Bilirubin, UA: NEGATIVE
Glucose, UA: NEGATIVE
KETONES UA: NEGATIVE
LEUKOCYTES UA: NEGATIVE
NITRITE UA: NEGATIVE
PH UA: 6
PROTEIN UA: NEGATIVE
RBC UA: NEGATIVE
Spec Grav, UA: 1.01
Urobilinogen, UA: NEGATIVE

## 2016-04-18 MED ORDER — OMEPRAZOLE 20 MG PO CPDR: 20.0000 mg | DELAYED_RELEASE_CAPSULE | Freq: Two times a day (BID) | ORAL | Status: DC

## 2016-04-18 MED ORDER — AMOXICILLIN-POT CLAVULANATE 875-125 MG PO TABS: 1.0000 | ORAL_TABLET | Freq: Two times a day (BID) | ORAL | Status: DC

## 2016-04-18 MED ORDER — GABAPENTIN 600 MG PO TABS: 600.0000 mg | ORAL_TABLET | Freq: Three times a day (TID) | ORAL | Status: DC

## 2016-04-18 MED ORDER — AZITHROMYCIN 250 MG PO TABS: ORAL_TABLET | ORAL | Status: DC

## 2016-04-18 NOTE — Progress Notes (Signed)
Subjective:    Andrea [name] is a 35 y.o. female being seen today for her obstetrical visit. She is at 3391w1d gestation. Patient reports no complaints. Fetal movement: normal.  Problem List Items Addressed This Visit    None    Visit Diagnoses    Prenatal care in third trimester    -  Primary    Relevant Orders    US MFM OB COMP + 14 WK    POCT urinalysis dipstick (Completed)    Subacute maxillary sinusitis        Relevant Medications    azithromycin (ZITHROMAX Z-PAK) 250 MG tablet    Other Relevant Orders    AMB referral to maternal fetal medicine      Patient Active Problem List   Diagnosis Date Noted  . Supervision of pregnancy with history of pre-term labor in first trimester 10/31/2015   Objective:    BP 128/80 mmHg  Wt 222 lb (100.699 kg)  LMP 09/21/2015 (Approximate) FHT:  150 BPM  Uterine Size: size equals dates  Presentation: unsure     Assessment:    Pregnancy @ 5491w1d weeks    Previous traumatic vaginal delivery of small baby.  Considering delivery options.  Plan:    Referred to MFM for consultation and recommendation for route of delivery    labs reviewed, problem list updated Consent signed. GBS sent TDAP offered  Rhogam given for RH negative Pediatrician: discussed. Infant feeding: plans to breastfeed. Maternity leave: discussed. Cigarette smoking: former smoker.  Orders Placed This Encounter  Procedures  . US MFM OB COMP + 14 WK    Standing Status: Future     Number of Occurrences:      Standing Expiration Date: 06/18/2017    Order Specific Question:  Reason for Exam (SYMPTOM  OR DIAGNOSIS REQUIRED)    Answer:  Previous traumatic birth of 5 pounder.  Request delivery recommendation.    Order Specific Question:  Preferred imaging location?    Answer:  [facility]  . AMB referral to maternal fetal medicine    Referral Priority:  Routine    Referral Type:  Consultation    Referral Reason:  Specialty Services Required    Number of Visits  Requested:  1  . POCT urinalysis dipstick   Meds ordered this encounter  Medications  . azithromycin (ZITHROMAX Z-PAK) 250 MG tablet    Sig: Take as directed.    Dispense:  6 tablet    Refill:  1   Follow up in 1 Week.

## 2016-04-19 ENCOUNTER — Other Ambulatory Visit: Payer: Self-pay | Admitting: Obstetrics

## 2016-04-19 ENCOUNTER — Ambulatory Visit (HOSPITAL_COMMUNITY)
Admission: RE | Admit: 2016-04-19 | Discharge: 2016-04-19 | Disposition: A | Discharge status: Home / Self Care | Payer: Medicaid Other | Source: Ambulatory Visit | Attending: Obstetrics | Admitting: Obstetrics

## 2016-04-19 ENCOUNTER — Inpatient Hospital Stay (HOSPITAL_COMMUNITY)
Admission: AD | Admit: 2016-04-19 | Discharge: 2016-04-27 | DRG: 765 | Disposition: A | Discharge status: Home / Self Care | Payer: Medicaid Other | Source: Ambulatory Visit | Attending: Obstetrics | Admitting: Obstetrics

## 2016-04-19 ENCOUNTER — Ambulatory Visit (HOSPITAL_COMMUNITY): Payer: Medicaid Other

## 2016-04-19 ENCOUNTER — Encounter (HOSPITAL_COMMUNITY): Payer: Self-pay | Admitting: *Deleted

## 2016-04-19 VITALS — BP 133/76 | HR 94 | Wt 223.0 lb

## 2016-04-19 DIAGNOSIS — Z87891 Personal history of nicotine dependence: Secondary | ICD-10-CM

## 2016-04-19 DIAGNOSIS — O09893 Supervision of other high risk pregnancies, third trimester: Secondary | ICD-10-CM

## 2016-04-19 DIAGNOSIS — L409 Psoriasis, unspecified: Secondary | ICD-10-CM | POA: Diagnosis present

## 2016-04-19 DIAGNOSIS — Z3A35 35 weeks gestation of pregnancy: Secondary | ICD-10-CM

## 2016-04-19 DIAGNOSIS — O4100X0 Oligohydramnios, unspecified trimester, not applicable or unspecified: Secondary | ICD-10-CM | POA: Diagnosis present

## 2016-04-19 DIAGNOSIS — O4103X0 Oligohydramnios, third trimester, not applicable or unspecified: Secondary | ICD-10-CM

## 2016-04-19 DIAGNOSIS — L719 Rosacea, unspecified: Secondary | ICD-10-CM | POA: Diagnosis present

## 2016-04-19 DIAGNOSIS — O09213 Supervision of pregnancy with history of pre-term labor, third trimester: Secondary | ICD-10-CM | POA: Insufficient documentation

## 2016-04-19 DIAGNOSIS — Z98891 History of uterine scar from previous surgery: Secondary | ICD-10-CM

## 2016-04-19 DIAGNOSIS — O09219 Supervision of pregnancy with history of pre-term labor, unspecified trimester: Secondary | ICD-10-CM

## 2016-04-19 DIAGNOSIS — O09293 Supervision of pregnancy with other poor reproductive or obstetric history, third trimester: Secondary | ICD-10-CM

## 2016-04-19 DIAGNOSIS — O42913 Preterm premature rupture of membranes, unspecified as to length of time between rupture and onset of labor, third trimester: Secondary | ICD-10-CM | POA: Diagnosis present

## 2016-04-19 DIAGNOSIS — O4290 Premature rupture of membranes, unspecified as to length of time between rupture and onset of labor, unspecified weeks of gestation: Secondary | ICD-10-CM

## 2016-04-19 HISTORY — DX: Anxiety disorder, unspecified: F41.9

## 2016-04-19 HISTORY — DX: Depression, unspecified: F32.A

## 2016-04-19 HISTORY — DX: Major depressive disorder, single episode, unspecified: F32.9

## 2016-04-19 LAB — TYPE AND SCREEN
ABO/RH(D): A POS
Antibody Screen: NEGATIVE

## 2016-04-19 LAB — CBC
HEMATOCRIT: 38.3 % (ref 36.0–46.0)
Hemoglobin: 13.8 g/dL (ref 12.0–15.0)
MCH: 31.6 pg (ref 26.0–34.0)
MCHC: 36 g/dL (ref 30.0–36.0)
MCV: 87.6 fL (ref 78.0–100.0)
PLATELETS: 241 10*3/uL (ref 150–400)
RBC: 4.37 MIL/uL (ref 3.87–5.11)
RDW: 13.2 % (ref 11.5–15.5)
WBC: 13.6 10*3/uL — AB (ref 4.0–10.5)

## 2016-04-19 LAB — AMNISURE RUPTURE OF MEMBRANE (ROM) NOT AT ARMC: AMNISURE: NEGATIVE

## 2016-04-19 LAB — ABO/RH: ABO/RH(D): A POS

## 2016-04-19 MED ORDER — ESCITALOPRAM OXALATE 20 MG PO TABS
20.0000 mg | ORAL_TABLET | Freq: Every day | ORAL | Status: DC
  Filled 2016-04-19 (×5): qty 1

## 2016-04-19 MED ORDER — CALCIUM CARBONATE ANTACID 500 MG PO CHEW: 2.0000 | CHEWABLE_TABLET | ORAL | Status: DC | PRN

## 2016-04-19 MED ORDER — DOCUSATE SODIUM 100 MG PO CAPS
100.0000 mg | ORAL_CAPSULE | Freq: Every day | ORAL | Status: DC
  Administered 2016-04-19 – 2016-04-23 (×5): 100 mg via ORAL
  Filled 2016-04-19 (×5): qty 1

## 2016-04-19 MED ORDER — FLUTICASONE PROPIONATE 50 MCG/ACT NA SUSP
2.0000 | Freq: Every day | NASAL | Status: DC
  Administered 2016-04-19 – 2016-04-23 (×5): 2 via NASAL
  Filled 2016-04-19: qty 16

## 2016-04-19 MED ORDER — DOCUSATE SODIUM 100 MG PO CAPS: 100.0000 mg | ORAL_CAPSULE | Freq: Every day | ORAL | Status: DC

## 2016-04-19 MED ORDER — BETAMETHASONE SOD PHOS & ACET 6 (3-3) MG/ML IJ SUSP
12.0000 mg | INTRAMUSCULAR | Status: AC
  Administered 2016-04-19 – 2016-04-20 (×2): 12 mg via INTRAMUSCULAR
  Filled 2016-04-19 (×2): qty 2

## 2016-04-19 MED ORDER — ZOLPIDEM TARTRATE 5 MG PO TABS: 5.0000 mg | ORAL_TABLET | Freq: Every evening | ORAL | Status: DC | PRN

## 2016-04-19 MED ORDER — PRENATAL MULTIVITAMIN CH: 1.0000 | ORAL_TABLET | Freq: Every day | ORAL | Status: DC

## 2016-04-19 MED ORDER — CYCLOBENZAPRINE HCL 10 MG PO TABS
10.0000 mg | ORAL_TABLET | Freq: Three times a day (TID) | ORAL | Status: DC | PRN
  Administered 2016-04-19 – 2016-04-23 (×6): 10 mg via ORAL
  Filled 2016-04-19 (×9): qty 1

## 2016-04-19 MED ORDER — AMOXICILLIN-POT CLAVULANATE 875-125 MG PO TABS
1.0000 | ORAL_TABLET | Freq: Two times a day (BID) | ORAL | Status: DC
  Administered 2016-04-19 – 2016-04-23 (×9): 1 via ORAL
  Filled 2016-04-19 (×10): qty 1

## 2016-04-19 MED ORDER — GABAPENTIN 300 MG PO CAPS
600.0000 mg | ORAL_CAPSULE | Freq: Three times a day (TID) | ORAL | Status: DC
  Administered 2016-04-19 – 2016-04-23 (×13): 600 mg via ORAL
  Filled 2016-04-19 (×14): qty 2

## 2016-04-19 MED ORDER — PRENATAL MULTIVITAMIN CH
1.0000 | ORAL_TABLET | Freq: Every day | ORAL | Status: DC
  Administered 2016-04-19 – 2016-04-23 (×5): 1 via ORAL
  Filled 2016-04-19 (×5): qty 1

## 2016-04-19 MED ORDER — LACTATED RINGERS IV SOLN
INTRAVENOUS | Status: DC
  Administered 2016-04-19 – 2016-04-24 (×4): via INTRAVENOUS

## 2016-04-19 MED ORDER — ACETAMINOPHEN 325 MG PO TABS: 650.0000 mg | ORAL_TABLET | ORAL | Status: DC | PRN

## 2016-04-19 NOTE — ED Notes (Signed)
Pt to MAU for evaluation of low fluid.

## 2016-04-19 NOTE — MAU Note (Signed)
Urine sent to lab 

## 2016-04-19 NOTE — Consult Note (Signed)
MFM Consultation:  I saw Andrea [name] in consultation today for 1. Mode of delivery in regard to prior shoulder dystocia/traumatic birth and 2. Prior low normal AFI in setting of normal growth pattern.  Oligohydramnios was demonstrated today with AFI 2.5 but no 2x2cm pocket.  The fetal growth pattern is not consistent with IUGR as the underlying cause given EFW at the 70th%'le for gestational age.  BPP was 6/8 (-2 for lack of 2x2cm pocket).  The patient endorses 2 weeks of leaking watery fluid with periods where fluid runs down her leg.  I feel it is very likely she has pPROM.  That being said, her Amnisure and SSE for pooling/ferning/nitrazine all were not supportive of this diagnosis.  Thus, she has oligohydramnios which without a 2x2cm pocket warrants admission for inpatient monitoring at minimum daily NST and delivery 36-37 weeks by NICHD/ACOG/SMFM guidelines noting exact timing is [facility]Dr[facility] Clearance CootsHarper).  In the interim, she will also require repeat BPP w/in 24 hours and repeat AFI in 24 hours to determine in inpatient IV hydration will yield 2x2cm pocket and normalization of AFI.  Lastly, given her history of shoulder dystocia and birth trauma of the neonate during her last delivery of a preterm fetus, I expect that this fetus is at further increased risk given the more advanced gestational age and weight.  Regardless of any clinical pelvimetry, her OB history warrants consideration for delivery by cesarean.  This is the patient's wish as well as Dr. Verdell CarmineHarper's recommendation.  I agree with this recommendation for this pregnancy and for any future pregnancies for her as well.  Summary: 1. Admission 2. CEFM overnight, IV hydration 3. Repeat AFI/BPP in 24 hours 4. Daily EFM 5. If no 2x2 cm pocket, patient should remain as inpatient for daily EFM and then consider delivery 36-37 weeks 6. Even if there is a 2x2cm pocket, if oligohydramnios (AFI <5cm) persists,  recommend delivery by 37 weeks 7. Delivery by cesarean owing to hx shoulder dystocia/traumatic birth.  Time Spent: I spent in excess of 45 minutes in consultation with this patient to review records, evaluate her case, and provide her with an adequate discussion and education.  More than 50% of this time was spent in direct face-to-face counseling.  It was a pleasure seeing your patient in the office today.  Thank you for consultation. Please do not hesitate to contact our service for any further questions.   Thank you,  Louann SjogrenJeffrey Morgan Gaynelle Arabianenney   Denney, Louann SjogrenJeffrey Morgan, MD, MS, FACOG Assistant Professor Section of Maternal-Fetal Medicine [facility] [employer]

## 2016-04-19 NOTE — MAU Note (Signed)
Sent from MFM for low fluid. R/O SROM,

## 2016-04-19 NOTE — H&P (Signed)
History     CSN: [id number]  Arrival date and time: 04/19/16 1448   First Provider Initiated Contact with Patient 04/19/16 1527       Chief Complaint  Patient presents with  . Rupture of Membranes   HPI Andrea [name] is a 35 y.o. G2P0101 at [redacted]w[redacted]d who presents sent from MFM for possible PPROM. Patient found to have severe oligohydramnios per MFM today; AFI 2.5. BPP 6/8, off for low fluid.  Patient reports possible leaking for 2 weeks, unsure if discharge or SROM. Denies vaginal bleeding. Positive fetal movement.   OB History    Gravida Para Term Preterm AB TAB SAB Ectopic Multiple Living        Past Medical History  Diagnosis Date  . Polycystic ovarian disease   . Psoriasis   . Rosacea     Past Surgical History  Procedure Laterality Date  . Tonsillectomy and adenoidectomy    . Cholecystectomy      Family History  Problem Relation Age of Onset  . Cancer Mother   . Cancer Father   . Cancer Maternal Grandmother     Social History  Substance Use Topics  . Smoking status: Former Games developer  . Smokeless tobacco: Never Used  . Alcohol Use: No    Allergies:  Allergies  Allergen Reactions  . Codeine Nausea And Vomiting  . Hydrocodone Itching    itching  . Morphine And Related Nausea And Vomiting    Prescriptions prior to admission  Medication Sig Dispense Refill Last Dose  . amoxicillin-clavulanate (AUGMENTIN) 875-125 MG tablet Take 1 tablet by mouth 2 (two) times daily. (Patient not taking: Reported on 04/19/2016) 14 tablet 0 Not Taking  . azithromycin (ZITHROMAX Z-PAK) 250 MG tablet Take as directed. (Patient not taking: Reported on 04/19/2016) 6 tablet 1 Not Taking  . cyclobenzaprine (FLEXERIL) 10 MG tablet take 1 tablet by mouth three times a day if needed for muscle spasm 30 tablet 3 Taking  . DICLEGIS 10-10 MG TBEC take 2 tablets by mouth at bedtime 100 tablet 2 Taking  . docusate sodium (COLACE) 100 MG capsule Take 1 capsule (100 mg  total) by mouth 2 (two) times daily. 90 capsule 1 Taking  . escitalopram (LEXAPRO) 20 MG tablet Take 1 tablet (20 mg total) by mouth daily. (Patient not taking: Reported on 04/19/2016) 30 tablet 12 Not Taking  . fluconazole (DIFLUCAN) 100 MG tablet Take 1 tablet (100 mg total) by mouth once. Repeat dose in 48-72 hour. (Patient not taking: Reported on 04/19/2016) 3 tablet 0 Not Taking  . fluticasone (FLONASE) 50 MCG/ACT nasal spray Place 2 sprays into both nostrils daily. 16 g 2 Taking  . gabapentin (NEURONTIN) 600 MG tablet Take 1 tablet (600 mg total) by mouth 3 (three) times daily. 90 tablet 5 Taking  . omeprazole (PRILOSEC) 20 MG capsule Take 1 capsule (20 mg total) by mouth 2 (two) times daily before a meal. 60 capsule 1 Taking  . ondansetron (ZOFRAN) 4 MG tablet Take 1 tablet (4 mg total) by mouth daily as needed. 30 tablet 1 Taking  . polyethylene glycol powder (GLYCOLAX/MIRALAX) powder Take 17 g by mouth daily. 500 g 3 Taking  . predniSONE (STERAPRED UNI-PAK 21 TAB) 10 MG (21) TBPK tablet Follow dosing on pack. (Patient not taking: Reported on 04/19/2016) 21 tablet 0 Not Taking  . Prenat w/o A-FeCbn-DSS-FA-DHA (CITRANATAL HARMONY) 30-1-260 MG CAPS Take 1 tablet by mouth daily. 30 capsule 12  Taking  . RA STOOL SOFTENER 100 MG capsule take 1 capsule by mouth twice a day 90 capsule 1 Taking  . terconazole (TERAZOL 7) 0.4 % vaginal cream Place 1 applicator vaginally at bedtime. (Patient not taking: Reported on 04/19/2016) 45 g 0 Not Taking    Review of Systems  Constitutional: Negative.   Gastrointestinal: Negative.   Genitourinary: Negative for dysuria.       + vaginal discharge vs LOF   Physical Exam   Blood pressure 132/75, pulse 83, temperature 97.9 F (36.6 C), temperature source Oral, resp. rate 18, last menstrual period 09/21/2015.  Physical Exam  Nursing note and vitals reviewed. Constitutional: She is oriented to person, place, and time. She appears well-developed and  well-nourished. No distress.  HENT:  Head: Normocephalic and atraumatic.  Eyes: Conjunctivae are normal. Right eye exhibits no discharge. Left eye exhibits no discharge. No scleral icterus.  Neck: Normal range of motion.  Cardiovascular: Normal rate, regular rhythm and normal heart sounds.   No murmur heard. Respiratory: Effort normal and breath sounds normal. No respiratory distress. She has no wheezes.  GI: Soft.  Genitourinary:  Small amount of white mucoid discharge.  No pooling  Neurological: She is alert and oriented to person, place, and time.  Skin: Skin is warm and dry. She is not diaphoretic.  Psychiatric: She has a normal mood and affect. Her behavior is normal. Judgment and thought content normal.   Fetal Tracing:  Baseline: 145 Variability: moderate Accelerations: 15x15 Decelerations: none  Toco: none   MAU Course  Procedures Results for orders placed or performed during the hospital encounter of 04/19/16 (from the past 24 hour(s))  Am[facility]isure [facility]rom[facility]     Status: None   Collection Time: 04/19/16  3:20 PM  Result Value Ref Range   Amnisure ROM NEGATIVE     MDM Reactive fetal tracing No pooling, fern negative, amnisure negative Patient will be admitted to antenatal for IV fluid, monitoring, and BMZ; will plan for c/section @ 36 weeks. This is per MFM recommendation for severe oligo  Assessment and Plan  A: 1. Oligohydramnios, third trimester, not applicable or unspecified fetus     P: Admit to antenatal IV fluids Antenatal steroids  Charles A. Clearance CootsHarper MD 04-19-2016, [phone]

## 2016-04-19 NOTE — Progress Notes (Signed)
MFM Consultation:  I saw Andrea [name] in consultation today for 1. Mode of delivery in regard to prior shoulder dystocia/traumatic birth and 2. Prior low normal AFI in setting of normal growth pattern.  Oligohydramnios was demonstrated today with AFI 2.5 but no 2x2cm pocket.  The fetal growth pattern is not consistent with IUGR as the underlying cause given EFW at the 70th%'le for gestational age.  BPP was 6/8 (-2 for lack of 2x2cm pocket).  The patient endorses 2 weeks of leaking watery fluid with periods where fluid runs down her leg.  I feel it is very likely she has pPROM.  That being said, her Amnisure and SSE for pooling/ferning/nitrazine all were not supportive of this diagnosis.  Thus, she has oligohydramnios which without a 2x2cm pocket warrants admission for inpatient monitoring at minimum daily NST and delivery 36-37 weeks by NICHD/ACOG/SMFM guidelines noting exact timing is depending on preference by the OB provider (Dr. Harper).  In the interim, she will also require repeat BPP w/in 24 hours and repeat AFI in 24 hours to determine in inpatient IV hydration will yield 2x2cm pocket and normalization of AFI.  Lastly, given her history of shoulder dystocia and birth trauma of the neonate during her last delivery of a preterm fetus, I expect that this fetus is at further increased risk given the more advanced gestational age and weight.  Regardless of any clinical pelvimetry, her OB history warrants consideration for delivery by cesarean.  This is the patient's wish as well as Dr. Harper's recommendation.  I agree with this recommendation for this pregnancy and for any future pregnancies for her as well.  Summary: 1. Admission 2. CEFM overnight, IV hydration 3. Repeat AFI/BPP in 24 hours 4. Daily EFM 5. If no 2x2 cm pocket, patient should remain as inpatient for daily EFM and then consider delivery 36-37 weeks 6. Even if there is a 2x2cm pocket, if oligohydramnios (AFI <5cm) persists,  recommend delivery by 37 weeks 7. Delivery by cesarean owing to hx shoulder dystocia/traumatic birth.  Time Spent: I spent in excess of 45 minutes in consultation with this patient to review records, evaluate her case, and provide her with an adequate discussion and education.  More than 50% of this time was spent in direct face-to-face counseling.  It was a pleasure seeing your patient in the office today.  Thank you for consultation. Please do not hesitate to contact our service for any further questions.   Thank you,  Jeffrey Morgan Denney   Denney, Jeffrey Morgan, MD, MS, FACOG Assistant Professor Section of Maternal-Fetal Medicine [employer]     

## 2016-04-19 NOTE — MAU Provider Note (Signed)
History     CSN: [id number]  Arrival date and time: 04/19/16 1448   First Provider Initiated Contact with Patient 04/19/16 1527       Chief Complaint  Patient presents with  . Rupture of Membranes   HPI Andrea [name] is a 35 y.o. G2P0101 at 3772w2d who presents sent from MFM for possible PPROM. Patient found to have severe oligohydramnios per MFM today; AFI 2.5. BPP 6/8, off for low fluid.  Patient reports possible leaking for 2 weeks, unsure if discharge or SROM. Denies vaginal bleeding. Positive fetal movement.   OB History    Gravida Para Term Preterm AB TAB SAB Ectopic Multiple Living   2 1 0 1 0 0 0 0 0 1       Past Medical History  Diagnosis Date  . Polycystic ovarian disease   . Psoriasis   . Rosacea     Past Surgical History  Procedure Laterality Date  . Tonsillectomy and adenoidectomy    . Cholecystectomy      Family History  Problem Relation Age of Onset  . Cancer Mother   . Cancer Father   . Cancer Maternal Grandmother     Social History  Substance Use Topics  . Smoking status: Former Games developermoker  . Smokeless tobacco: Never Used  . Alcohol Use: No    Allergies:  Allergies  Allergen Reactions  . Codeine Nausea And Vomiting  . Hydrocodone Itching    itching  . Morphine And Related Nausea And Vomiting    Prescriptions prior to admission  Medication Sig Dispense Refill Last Dose  . amoxicillin-clavulanate (AUGMENTIN) 875-125 MG tablet Take 1 tablet by mouth 2 (two) times daily. (Patient not taking: Reported on 04/19/2016) 14 tablet 0 Not Taking  . azithromycin (ZITHROMAX Z-PAK) 250 MG tablet Take as directed. (Patient not taking: Reported on 04/19/2016) 6 tablet 1 Not Taking  . cyclobenzaprine (FLEXERIL) 10 MG tablet take 1 tablet by mouth three times a day if needed for muscle spasm 30 tablet 3 Taking  . DICLEGIS 10-10 MG TBEC take 2 tablets by mouth at bedtime 100 tablet 2 Taking  . docusate sodium (COLACE) 100 MG capsule Take 1 capsule (100 mg  total) by mouth 2 (two) times daily. 90 capsule 1 Taking  . escitalopram (LEXAPRO) 20 MG tablet Take 1 tablet (20 mg total) by mouth daily. (Patient not taking: Reported on 04/19/2016) 30 tablet 12 Not Taking  . fluconazole (DIFLUCAN) 100 MG tablet Take 1 tablet (100 mg total) by mouth once. Repeat dose in 48-72 hour. (Patient not taking: Reported on 04/19/2016) 3 tablet 0 Not Taking  . fluticasone (FLONASE) 50 MCG/ACT nasal spray Place 2 sprays into both nostrils daily. 16 g 2 Taking  . gabapentin (NEURONTIN) 600 MG tablet Take 1 tablet (600 mg total) by mouth 3 (three) times daily. 90 tablet 5 Taking  . omeprazole (PRILOSEC) 20 MG capsule Take 1 capsule (20 mg total) by mouth 2 (two) times daily before a meal. 60 capsule 1 Taking  . ondansetron (ZOFRAN) 4 MG tablet Take 1 tablet (4 mg total) by mouth daily as needed. 30 tablet 1 Taking  . polyethylene glycol powder (GLYCOLAX/MIRALAX) powder Take 17 g by mouth daily. 500 g 3 Taking  . predniSONE (STERAPRED UNI-PAK 21 TAB) 10 MG (21) TBPK tablet Follow dosing on pack. (Patient not taking: Reported on 04/19/2016) 21 tablet 0 Not Taking  . Prenat w/o A-FeCbn-DSS-FA-DHA (CITRANATAL HARMONY) 30-1-260 MG CAPS Take 1 tablet by mouth daily. 30 capsule 12  Taking  . RA STOOL SOFTENER 100 MG capsule take 1 capsule by mouth twice a day 90 capsule 1 Taking  . terconazole (TERAZOL 7) 0.4 % vaginal cream Place 1 applicator vaginally at bedtime. (Patient not taking: Reported on 04/19/2016) 45 g 0 Not Taking    Review of Systems  Constitutional: Negative.   Gastrointestinal: Negative.   Genitourinary: Negative for dysuria.       + vaginal discharge vs LOF   Physical Exam   Blood pressure 132/75, pulse 83, temperature 97.9 F (36.6 C), temperature source Oral, resp. rate 18, last menstrual period 09/21/2015.  Physical Exam  Nursing note and vitals reviewed. Constitutional: She is oriented to person, place, and time. She appears well-developed and  well-nourished. No distress.  HENT:  Head: Normocephalic and atraumatic.  Eyes: Conjunctivae are normal. Right eye exhibits no discharge. Left eye exhibits no discharge. No scleral icterus.  Neck: Normal range of motion.  Cardiovascular: Normal rate, regular rhythm and normal heart sounds.   No murmur heard. Respiratory: Effort normal and breath sounds normal. No respiratory distress. She has no wheezes.  GI: Soft.  Genitourinary:  Small amount of white mucoid discharge.  No pooling  Neurological: She is alert and oriented to person, place, and time.  Skin: Skin is warm and dry. She is not diaphoretic.  Psychiatric: She has a normal mood and affect. Her behavior is normal. Judgment and thought content normal.   Fetal Tracing:  Baseline: 145 Variability: moderate Accelerations: 15x15 Decelerations: none  Toco: none   MAU Course  Procedures Results for orders placed or performed during the hospital encounter of 04/19/16 (from the past 24 hour(s))  Amnisure rupture of membrane (rom)not at [facility]     Status: None   Collection Time: 04/19/16  3:20 PM  Result Value Ref Range   Amnisure ROM NEGATIVE     MDM Reactive fetal tracing No pooling, fern negative, amnisure negative S/w Dr. Clearance Coots regarding patient. Patient will be admitted to antenatal for IV fluid, monitoring, and BMZ; will plan for c/section @ 36 weeks. This is per MFM recommendation for severe oligo  Assessment and Plan  A: 1. Oligohydramnios, third trimester, not applicable or unspecified fetus     P: Admit to antenatal IV fluids Antenatal steroids  Judeth Horn 04/19/2016, 3:27 PM

## 2016-04-20 LAB — RPR: RPR Ser Ql: NONREACTIVE

## 2016-04-20 NOTE — Progress Notes (Signed)
Patient ID: Everlean M [place], female   DOB: 24-Dec-1980, 35 y.o.   MRN: [id number] Hospital Day: 2  S: No complaints  O: Blood pressure 119/67, pulse 82, temperature 98.3 F (36.8 C), temperature source Oral, resp. rate 20, height 5\' 7"  (1.702 m), weight 223 lb (101.152 kg), last menstrual period 09/21/2015.   WUJ:WJXBJYNWFHT:Baseline: 140 bpm Toco: None GNF:[id number]:Dilation: 1 Effacement (%): Thick Cervical Position: Posterior Exam by:: E.Lawrence, NP  A/P- 35 y.o. admitted with:  Oligohydramnios.  Stable on bedrest.  Continue bedrest.  Delivery at 36 weeks per MFM recommendation.  Present on Admission:  . Oligohydramnios  Pregnancy Complications: oligohydramnios  Preterm labor management: bedrest advised Dating:  3218w3d PNL Needed:  none FWB:  good PTL:  stable ROD: C-section without labor

## 2016-04-21 MED ORDER — PANTOPRAZOLE SODIUM 40 MG PO TBEC
40.0000 mg | DELAYED_RELEASE_TABLET | Freq: Every day | ORAL | Status: DC
  Administered 2016-04-21 – 2016-04-23 (×3): 40 mg via ORAL
  Filled 2016-04-21 (×3): qty 1

## 2016-04-21 NOTE — Progress Notes (Signed)
Patient ID: Andrea [name], female   DOB: 30-Jun-1981, 35 y.o.   MRN: [id number] Hospital Day: 3  S: No complaints.  O: Blood pressure 131/75, pulse 90, temperature 98.3 F (36.8 C), temperature source Oral, resp. rate 18, height 5\' 7"  (1.702 m), weight 223 lb (101.152 kg), last menstrual period 09/21/2015.   WUJ:WJXBJYNWFHT:Baseline: 140 bpm Toco: None GNF:AOZHYQMVSVE:Dilation: 1 Effacement (%): Thick Cervical Position: Posterior Exam by:: E.Lawrence, NP  A/P- 35 y.o. admitted with:  Oligohydramnios.  Stable on bedrest.  Continue bedrest.  Present on Admission:  . Oligohydramnios

## 2016-04-21 NOTE — Progress Notes (Signed)
Dr Clearance CootsHarper made aware of uterine activity and patient complaint of lower abdominal cramping and pressure. Ok for patient to come off monitor. Pt informed to let staff know of increased uterine discomfort or vaginal bleeding. Will continue to monitor.

## 2016-04-22 LAB — TYPE AND SCREEN
ABO/RH(D): A POS
ANTIBODY SCREEN: NEGATIVE

## 2016-04-22 MED ORDER — POLYETHYLENE GLYCOL 3350 17 G PO PACK
17.0000 g | PACK | Freq: Every day | ORAL | Status: DC | PRN
  Administered 2016-04-22 (×2): 17 g via ORAL
  Filled 2016-04-22 (×3): qty 1

## 2016-04-22 MED ORDER — SODIUM CHLORIDE 0.9% FLUSH
3.0000 mL | Freq: Two times a day (BID) | INTRAVENOUS | Status: DC
  Administered 2016-04-22 – 2016-04-23 (×4): 3 mL via INTRAVENOUS

## 2016-04-22 MED ORDER — SODIUM CHLORIDE 0.9% FLUSH: 3.0000 mL | INTRAVENOUS | Status: DC | PRN

## 2016-04-22 MED ORDER — DOUBLE ANTIBIOTIC 500-10000 UNIT/GM EX OINT
TOPICAL_OINTMENT | Freq: Two times a day (BID) | CUTANEOUS | Status: DC
  Filled 2016-04-22 (×18): qty 1

## 2016-04-22 NOTE — Progress Notes (Addendum)
Patient ID: Andrea [name], female   DOB: [birth year], 35 y.o.   MRN: [id number] Hospital Day: 4  S: No complaints.  O: Blood pressure 116/75, pulse 86, temperature 98.4 F (36.9 C), temperature source Oral, resp. rate 18, height 5\' 7"  (1.702 m), weight 223 lb (101.152 kg), last menstrual period 09/21/2015, SpO2 99 %.   WUX:LKGMWNUUFHT:Baseline: 150 bpm Toco: None VOZ:DGUYQIHKSVE:Dilation: 1 Effacement (%): Thick Cervical Position: Posterior Exam by:: E.Lawrence, NP  A/P- 35 y.o. admitted with: Oligohydramnios.  Stable on bedrest.  C/S planned at 36 weeks.  Present on Admission:  . Oligohydramnios  Pregnancy Complications: oligohydramnios  Preterm labor management: no treatment necessary Dating:  566w5d PNL Needed:  none FWB:  good PTL:  none ROD: C-section without labor at 36 weeks for oligohydramnios

## 2016-04-23 MED ORDER — CYCLOBENZAPRINE HCL 10 MG PO TABS
10.0000 mg | ORAL_TABLET | Freq: Three times a day (TID) | ORAL | Status: DC
  Administered 2016-04-23 (×2): 10 mg via ORAL
  Filled 2016-04-23: qty 1

## 2016-04-23 NOTE — Progress Notes (Signed)
Patient ID: Andrea [name], female   DOB: May 20, 1981, 35 y.o.   MRN: [id number]  Hospital Day: 5  S: No complaints.  Continues to leak vaginal fluid.  Discussed C-section plans, questions answered.    O: Blood pressure 114/74, pulse 87, temperature 98.4 F (36.9 C), temperature source Oral, resp. rate 16, height 5\' 7"  (1.702 m), weight 223 lb (101.152 kg), last menstrual period 09/21/2015, SpO2 99 %.   WUJ:WJXBJYNWFHT:Baseline: 150 bpm, Variability: Good {> 6 bpm), Accelerations: Reactive and Decelerations: Variable: mild Toco: None GNF:AOZHYQMVSVE:Dilation: 1 Effacement (%): Thick Cervical Position: Posterior Exam by:: E.Lawrence, NP  A/P- 35 y.o. admitted with: Oligohydramnios. Stable on bedrest. C/S planned at 36 weeks on 04/24/16. NPO after midnight.    Present on Admission:  . Oligohydramnios  Pregnancy Complications: oligohydramnios  Preterm labor management: admitted to Ante until planned C-section, BMZ 5/19, 5/20 Dating:  2762w6d PNL Needed:  none FWB:  good

## 2016-04-23 NOTE — Anesthesia Preprocedure Evaluation (Addendum)
Anesthesia Evaluation  Patient identified by MRN, date of birth, ID band Patient awake    Reviewed: Allergy & Precautions, H&P , NPO status , Patient's Chart, lab work & pertinent test results  Airway Mallampati: II  TM Distance: >3 FB Neck ROM: full    Dental no notable dental hx.    Pulmonary former smoker,    Pulmonary exam normal        Cardiovascular negative cardio ROS Normal cardiovascular exam     Neuro/Psych negative neurological ROS  negative psych ROS   GI/Hepatic negative GI ROS, Neg liver ROS,   Endo/Other  negative endocrine ROS  Renal/GU negative Renal ROS     Musculoskeletal   Abdominal (+) + obese,   Peds  Hematology negative hematology ROS (+)   Anesthesia Other Findings   Reproductive/Obstetrics (+) Pregnancy                             Anesthesia Physical Anesthesia Plan  ASA: II  Anesthesia Plan: Spinal   Post-op Pain Management:    Induction:   Airway Management Planned:   Additional Equipment:   Intra-op Plan:   Post-operative Plan:   Informed Consent: I have reviewed the patients History and Physical, chart, labs and discussed the procedure including the risks, benefits and alternatives for the proposed anesthesia with the patient or authorized representative who has indicated his/her understanding and acceptance.     Plan Discussed with: CRNA and Surgeon  Anesthesia Plan Comments:         Anesthesia Quick Evaluation  

## 2016-04-23 NOTE — Telephone Encounter (Signed)
Patient has been in the office since call

## 2016-04-24 ENCOUNTER — Encounter (HOSPITAL_COMMUNITY): Payer: Self-pay | Admitting: *Deleted

## 2016-04-24 ENCOUNTER — Encounter (HOSPITAL_COMMUNITY)
Admission: AD | Disposition: A | Discharge status: Home / Self Care | Payer: Self-pay | Source: Ambulatory Visit | Attending: Obstetrics

## 2016-04-24 ENCOUNTER — Inpatient Hospital Stay (HOSPITAL_COMMUNITY): Payer: Medicaid Other | Admitting: Anesthesiology

## 2016-04-24 DIAGNOSIS — Z98891 History of uterine scar from previous surgery: Secondary | ICD-10-CM

## 2016-04-24 LAB — CBC
HEMATOCRIT: 38.9 % (ref 36.0–46.0)
HEMOGLOBIN: 13.8 g/dL (ref 12.0–15.0)
MCH: 31.3 pg (ref 26.0–34.0)
MCHC: 35.5 g/dL (ref 30.0–36.0)
MCV: 88.2 fL (ref 78.0–100.0)
Platelets: 251 10*3/uL (ref 150–400)
RBC: 4.41 MIL/uL (ref 3.87–5.11)
RDW: 13.3 % (ref 11.5–15.5)
WBC: 15.1 10*3/uL — AB (ref 4.0–10.5)

## 2016-04-24 SURGERY — Surgical Case
Anesthesia: Spinal

## 2016-04-24 MED ORDER — KETOROLAC TROMETHAMINE 30 MG/ML IJ SOLN
INTRAMUSCULAR | Status: AC
  Filled 2016-04-24: qty 1

## 2016-04-24 MED ORDER — ACETAMINOPHEN 325 MG PO TABS
650.0000 mg | ORAL_TABLET | ORAL | Status: DC | PRN
  Administered 2016-04-24: 650 mg via ORAL

## 2016-04-24 MED ORDER — PRENATAL MULTIVITAMIN CH
1.0000 | ORAL_TABLET | Freq: Every day | ORAL | Status: DC
  Administered 2016-04-26 – 2016-04-27 (×2): 1 via ORAL
  Filled 2016-04-24 (×3): qty 1

## 2016-04-24 MED ORDER — HYDROMORPHONE HCL 2 MG PO TABS
2.0000 mg | ORAL_TABLET | ORAL | Status: DC | PRN
  Administered 2016-04-24 – 2016-04-27 (×12): 2 mg via ORAL
  Filled 2016-04-24 (×12): qty 1

## 2016-04-24 MED ORDER — TETANUS-DIPHTH-ACELL PERTUSSIS 5-2.5-18.5 LF-MCG/0.5 IM SUSP: 0.5000 mL | Freq: Once | INTRAMUSCULAR | Status: DC

## 2016-04-24 MED ORDER — FENTANYL CITRATE (PF) 100 MCG/2ML IJ SOLN
INTRAMUSCULAR | Status: AC
  Filled 2016-04-24: qty 2

## 2016-04-24 MED ORDER — ESCITALOPRAM OXALATE 20 MG PO TABS
20.0000 mg | ORAL_TABLET | Freq: Every day | ORAL | Status: DC
  Filled 2016-04-24 (×5): qty 1

## 2016-04-24 MED ORDER — ACETAMINOPHEN 500 MG PO TABS: 1000.0000 mg | ORAL_TABLET | Freq: Four times a day (QID) | ORAL | Status: DC

## 2016-04-24 MED ORDER — ONDANSETRON HCL 4 MG/2ML IJ SOLN
INTRAMUSCULAR | Status: DC | PRN
Start: 2016-04-24 — End: 2016-04-24
  Administered 2016-04-24: 4 mg via INTRAVENOUS

## 2016-04-24 MED ORDER — OXYTOCIN 10 UNIT/ML IJ SOLN
40.0000 [IU] | INTRAMUSCULAR | Status: DC | PRN
  Administered 2016-04-24: 40 [IU] via INTRAVENOUS

## 2016-04-24 MED ORDER — FLUTICASONE PROPIONATE 50 MCG/ACT NA SUSP: 2.0000 | Freq: Every day | NASAL | Status: DC

## 2016-04-24 MED ORDER — NALOXONE HCL 2 MG/2ML IJ SOSY
1.0000 ug/kg/h | PREFILLED_SYRINGE | INTRAVENOUS | Status: DC | PRN
  Filled 2016-04-24: qty 2

## 2016-04-24 MED ORDER — LACTATED RINGERS IV SOLN
INTRAVENOUS | Status: DC | PRN
  Administered 2016-04-24 (×3): via INTRAVENOUS

## 2016-04-24 MED ORDER — COCONUT OIL OIL: 1.0000 "application " | TOPICAL_OIL | Status: DC | PRN

## 2016-04-24 MED ORDER — IBUPROFEN 600 MG PO TABS: 600.0000 mg | ORAL_TABLET | Freq: Four times a day (QID) | ORAL | Status: DC | PRN

## 2016-04-24 MED ORDER — OXYTOCIN 40 UNITS IN LACTATED RINGERS INFUSION - SIMPLE MED: 2.5000 [IU]/h | INTRAVENOUS | Status: AC

## 2016-04-24 MED ORDER — FENTANYL CITRATE (PF) 100 MCG/2ML IJ SOLN: 25.0000 ug | INTRAMUSCULAR | Status: DC | PRN

## 2016-04-24 MED ORDER — PHENYLEPHRINE 8 MG IN D5W 100 ML (0.08MG/ML) PREMIX OPTIME
INJECTION | INTRAVENOUS | Status: DC | PRN
  Administered 2016-04-24: 40 ug/min via INTRAVENOUS

## 2016-04-24 MED ORDER — SIMETHICONE 80 MG PO CHEW
80.0000 mg | CHEWABLE_TABLET | Freq: Three times a day (TID) | ORAL | Status: DC
  Administered 2016-04-24 – 2016-04-26 (×3): 80 mg via ORAL
  Filled 2016-04-24 (×5): qty 1

## 2016-04-24 MED ORDER — PHENYLEPHRINE 8 MG IN D5W 100 ML (0.08MG/ML) PREMIX OPTIME
INJECTION | INTRAVENOUS | Status: AC
  Filled 2016-04-24: qty 100

## 2016-04-24 MED ORDER — CITRIC ACID-SODIUM CITRATE 334-500 MG/5ML PO SOLN
30.0000 mL | ORAL | Status: AC
  Administered 2016-04-24: 30 mL via ORAL
  Filled 2016-04-24: qty 15

## 2016-04-24 MED ORDER — MENTHOL 3 MG MT LOZG: 1.0000 | LOZENGE | OROMUCOSAL | Status: DC | PRN

## 2016-04-24 MED ORDER — SIMETHICONE 80 MG PO CHEW
80.0000 mg | CHEWABLE_TABLET | ORAL | Status: DC
  Filled 2016-04-24 (×2): qty 1

## 2016-04-24 MED ORDER — TETANUS-DIPHTH-ACELL PERTUSSIS 5-2.5-18.5 LF-MCG/0.5 IM SUSP
0.5000 mL | Freq: Once | INTRAMUSCULAR | Status: AC
  Administered 2016-04-27: 0.5 mL via INTRAMUSCULAR
  Filled 2016-04-24: qty 0.5

## 2016-04-24 MED ORDER — ACETAMINOPHEN 325 MG PO TABS
650.0000 mg | ORAL_TABLET | ORAL | Status: DC | PRN
  Filled 2016-04-24: qty 2

## 2016-04-24 MED ORDER — BUPIVACAINE IN DEXTROSE 0.75-8.25 % IT SOLN
INTRATHECAL | Status: DC | PRN
  Administered 2016-04-24: 12 mg via INTRATHECAL

## 2016-04-24 MED ORDER — NALOXONE HCL 0.4 MG/ML IJ SOLN: 0.4000 mg | INTRAMUSCULAR | Status: DC | PRN

## 2016-04-24 MED ORDER — SCOPOLAMINE 1 MG/3DAYS TD PT72: 1.0000 | MEDICATED_PATCH | Freq: Once | TRANSDERMAL | Status: DC

## 2016-04-24 MED ORDER — PRENATAL MULTIVITAMIN CH
1.0000 | ORAL_TABLET | Freq: Every day | ORAL | Status: DC
Start: 2016-04-24 — End: 2016-04-26
  Administered 2016-04-25 – 2016-04-26 (×2): 1 via ORAL

## 2016-04-24 MED ORDER — KETOROLAC TROMETHAMINE 30 MG/ML IJ SOLN: 30.0000 mg | Freq: Once | INTRAMUSCULAR | Status: DC

## 2016-04-24 MED ORDER — KETOROLAC TROMETHAMINE 30 MG/ML IJ SOLN
30.0000 mg | Freq: Four times a day (QID) | INTRAMUSCULAR | Status: DC | PRN
  Filled 2016-04-24: qty 1

## 2016-04-24 MED ORDER — DIPHENHYDRAMINE HCL 25 MG PO CAPS: 25.0000 mg | ORAL_CAPSULE | Freq: Four times a day (QID) | ORAL | Status: DC | PRN

## 2016-04-24 MED ORDER — SODIUM CHLORIDE 0.9 % IR SOLN
Status: DC | PRN
  Administered 2016-04-24: 1000 mL

## 2016-04-24 MED ORDER — OXYCODONE-ACETAMINOPHEN 5-325 MG PO TABS
1.0000 | ORAL_TABLET | ORAL | Status: DC | PRN
  Administered 2016-04-24: 1 via ORAL
  Filled 2016-04-24: qty 1

## 2016-04-24 MED ORDER — LACTATED RINGERS IV SOLN
INTRAVENOUS | Status: DC
  Administered 2016-04-24: 22:00:00 via INTRAVENOUS

## 2016-04-24 MED ORDER — MORPHINE SULFATE (PF) 0.5 MG/ML IJ SOLN
INTRAMUSCULAR | Status: DC | PRN
  Administered 2016-04-24: .2 mg via INTRATHECAL

## 2016-04-24 MED ORDER — WITCH HAZEL-GLYCERIN EX PADS: 1.0000 "application " | MEDICATED_PAD | CUTANEOUS | Status: DC | PRN

## 2016-04-24 MED ORDER — SIMETHICONE 80 MG PO CHEW: 80.0000 mg | CHEWABLE_TABLET | ORAL | Status: DC | PRN

## 2016-04-24 MED ORDER — OXYTOCIN 10 UNIT/ML IJ SOLN
INTRAMUSCULAR | Status: AC
  Filled 2016-04-24: qty 4

## 2016-04-24 MED ORDER — SENNOSIDES-DOCUSATE SODIUM 8.6-50 MG PO TABS
2.0000 | ORAL_TABLET | ORAL | Status: DC
  Administered 2016-04-24: 2 via ORAL
  Filled 2016-04-24 (×3): qty 2

## 2016-04-24 MED ORDER — ONDANSETRON HCL 4 MG/2ML IJ SOLN: 4.0000 mg | Freq: Once | INTRAMUSCULAR | Status: DC | PRN

## 2016-04-24 MED ORDER — SODIUM CHLORIDE 0.9% FLUSH: 3.0000 mL | INTRAVENOUS | Status: DC | PRN

## 2016-04-24 MED ORDER — SENNOSIDES-DOCUSATE SODIUM 8.6-50 MG PO TABS
2.0000 | ORAL_TABLET | ORAL | Status: DC
Start: 2016-04-25 — End: 2016-04-27
  Administered 2016-04-24 – 2016-04-26 (×3): 2 via ORAL
  Filled 2016-04-24: qty 2

## 2016-04-24 MED ORDER — DIBUCAINE 1 % RE OINT: 1.0000 "application " | TOPICAL_OINTMENT | RECTAL | Status: DC | PRN

## 2016-04-24 MED ORDER — ZOLPIDEM TARTRATE 5 MG PO TABS: 5.0000 mg | ORAL_TABLET | Freq: Every evening | ORAL | Status: DC | PRN

## 2016-04-24 MED ORDER — DIPHENHYDRAMINE HCL 25 MG PO CAPS
25.0000 mg | ORAL_CAPSULE | ORAL | Status: DC | PRN
  Filled 2016-04-24: qty 1

## 2016-04-24 MED ORDER — IBUPROFEN 600 MG PO TABS
600.0000 mg | ORAL_TABLET | Freq: Four times a day (QID) | ORAL | Status: DC
  Administered 2016-04-24 – 2016-04-27 (×12): 600 mg via ORAL
  Filled 2016-04-24 (×13): qty 1

## 2016-04-24 MED ORDER — CYCLOBENZAPRINE HCL 10 MG PO TABS
10.0000 mg | ORAL_TABLET | Freq: Three times a day (TID) | ORAL | Status: DC | PRN
  Filled 2016-04-24: qty 1

## 2016-04-24 MED ORDER — LACTATED RINGERS IV SOLN: INTRAVENOUS | Status: DC

## 2016-04-24 MED ORDER — SIMETHICONE 80 MG PO CHEW
80.0000 mg | CHEWABLE_TABLET | ORAL | Status: DC
  Administered 2016-04-24 – 2016-04-26 (×3): 80 mg via ORAL
  Filled 2016-04-24: qty 1

## 2016-04-24 MED ORDER — DIPHENHYDRAMINE HCL 50 MG/ML IJ SOLN
12.5000 mg | INTRAMUSCULAR | Status: DC | PRN
  Administered 2016-04-24: 12.5 mg via INTRAVENOUS
  Filled 2016-04-24: qty 1

## 2016-04-24 MED ORDER — ONDANSETRON HCL 4 MG/2ML IJ SOLN: 4.0000 mg | Freq: Three times a day (TID) | INTRAMUSCULAR | Status: DC | PRN

## 2016-04-24 MED ORDER — MENTHOL 3 MG MT LOZG
1.0000 | LOZENGE | OROMUCOSAL | Status: DC | PRN
Start: 2016-04-24 — End: 2016-04-26

## 2016-04-24 MED ORDER — PANTOPRAZOLE SODIUM 40 MG PO TBEC: 40.0000 mg | DELAYED_RELEASE_TABLET | Freq: Every day | ORAL | Status: DC

## 2016-04-24 MED ORDER — CEFAZOLIN SODIUM-DEXTROSE 2-3 GM-% IV SOLR
INTRAVENOUS | Status: DC | PRN
  Administered 2016-04-24: 2 g via INTRAVENOUS

## 2016-04-24 MED ORDER — MEPERIDINE HCL 25 MG/ML IJ SOLN: 6.2500 mg | INTRAMUSCULAR | Status: DC | PRN

## 2016-04-24 MED ORDER — SCOPOLAMINE 1 MG/3DAYS TD PT72
MEDICATED_PATCH | TRANSDERMAL | Status: DC | PRN
  Administered 2016-04-24: 1 via TRANSDERMAL

## 2016-04-24 MED ORDER — FENTANYL CITRATE (PF) 100 MCG/2ML IJ SOLN
INTRAMUSCULAR | Status: DC | PRN
  Administered 2016-04-24: 20 ug via INTRATHECAL

## 2016-04-24 MED ORDER — GABAPENTIN 600 MG PO TABS: 600.0000 mg | ORAL_TABLET | Freq: Three times a day (TID) | ORAL | Status: DC

## 2016-04-24 MED ORDER — MORPHINE SULFATE (PF) 0.5 MG/ML IJ SOLN
INTRAMUSCULAR | Status: AC
  Filled 2016-04-24: qty 10

## 2016-04-24 MED ORDER — SIMETHICONE 80 MG PO CHEW
80.0000 mg | CHEWABLE_TABLET | ORAL | Status: DC | PRN
  Administered 2016-04-26: 80 mg via ORAL

## 2016-04-24 MED ORDER — HYDROMORPHONE HCL 1 MG/ML IJ SOLN
1.0000 mg | Freq: Once | INTRAMUSCULAR | Status: AC
  Administered 2016-04-24: 1 mg via INTRAVENOUS
  Filled 2016-04-24: qty 1

## 2016-04-24 MED ORDER — SCOPOLAMINE 1 MG/3DAYS TD PT72
MEDICATED_PATCH | TRANSDERMAL | Status: AC
  Filled 2016-04-24: qty 1

## 2016-04-24 MED ORDER — OXYCODONE-ACETAMINOPHEN 5-325 MG PO TABS
2.0000 | ORAL_TABLET | ORAL | Status: DC | PRN
  Administered 2016-04-25: 2 via ORAL
  Filled 2016-04-24: qty 2

## 2016-04-24 MED ORDER — ONDANSETRON HCL 4 MG/2ML IJ SOLN
INTRAMUSCULAR | Status: AC
  Filled 2016-04-24: qty 2

## 2016-04-24 MED ORDER — SIMETHICONE 80 MG PO CHEW
80.0000 mg | CHEWABLE_TABLET | Freq: Three times a day (TID) | ORAL | Status: DC
  Administered 2016-04-25 – 2016-04-27 (×5): 80 mg via ORAL
  Filled 2016-04-24 (×3): qty 1

## 2016-04-24 MED ORDER — KETOROLAC TROMETHAMINE 30 MG/ML IJ SOLN
30.0000 mg | Freq: Four times a day (QID) | INTRAMUSCULAR | Status: DC | PRN
  Administered 2016-04-24: 30 mg via INTRAMUSCULAR
  Filled 2016-04-24 (×2): qty 1

## 2016-04-24 SURGICAL SUPPLY — 42 items
APL SKNCLS STERI-STRIP NONHPOA (GAUZE/BANDAGES/DRESSINGS) ×1
BENZOIN TINCTURE PRP APPL 2/3 (GAUZE/BANDAGES/DRESSINGS) ×2 IMPLANT
CLAMP CORD UMBIL (MISCELLANEOUS) IMPLANT
CLOSURE STERI STRIP 1/2 X4 (GAUZE/BANDAGES/DRESSINGS) ×2 IMPLANT
CLOTH BEACON ORANGE TIMEOUT ST (SAFETY) ×3 IMPLANT
CONTAINER PREFILL 10% NBF 15ML (MISCELLANEOUS) ×6 IMPLANT
DRSG OPSITE POSTOP 4X10 (GAUZE/BANDAGES/DRESSINGS) ×3 IMPLANT
DURAPREP 26ML APPLICATOR (WOUND CARE) ×3 IMPLANT
ELECT REM PT RETURN 9FT ADLT (ELECTROSURGICAL) ×3
ELECTRODE REM PT RTRN 9FT ADLT (ELECTROSURGICAL) ×1 IMPLANT
EXTRACTOR VACUUM M CUP 4 TUBE (SUCTIONS) IMPLANT
EXTRACTOR VACUUM M CUP 4' TUBE (SUCTIONS)
GLOVE BIO SURGEON STRL SZ8 (GLOVE) ×3 IMPLANT
GLOVE BIOGEL PI IND STRL 7.0 (GLOVE) ×1 IMPLANT
GLOVE BIOGEL PI INDICATOR 7.0 (GLOVE) ×2
GOWN STRL REUS W/TWL LRG LVL3 (GOWN DISPOSABLE) ×6 IMPLANT
KIT ABG SYR 3ML LUER SLIP (SYRINGE) IMPLANT
LIQUID BAND (GAUZE/BANDAGES/DRESSINGS) ×3 IMPLANT
NDL HYPO 25X5/8 SAFETYGLIDE (NEEDLE) ×1 IMPLANT
NEEDLE HYPO 22GX1.5 SAFETY (NEEDLE) ×3 IMPLANT
NEEDLE HYPO 25X5/8 SAFETYGLIDE (NEEDLE) ×3 IMPLANT
NS IRRIG 1000ML POUR BTL (IV SOLUTION) ×3 IMPLANT
PACK C SECTION WH (CUSTOM PROCEDURE TRAY) ×3 IMPLANT
PAD OB MATERNITY 4.3X12.25 (PERSONAL CARE ITEMS) ×3 IMPLANT
PENCIL SMOKE EVAC W/HOLSTER (ELECTROSURGICAL) ×3 IMPLANT
RETRACTOR TRAXI PANNICULUS (MISCELLANEOUS) IMPLANT
RTRCTR C-SECT PINK 25CM LRG (MISCELLANEOUS) ×3 IMPLANT
SUT GUT PLAIN 0 CT-3 TAN 27 (SUTURE) IMPLANT
SUT MNCRL 0 VIOLET CTX 36 (SUTURE) ×3 IMPLANT
SUT MNCRL AB 4-0 PS2 18 (SUTURE) IMPLANT
SUT MON AB 2-0 CT1 27 (SUTURE) ×3 IMPLANT
SUT MON AB 3-0 SH 27 (SUTURE)
SUT MON AB 3-0 SH27 (SUTURE) IMPLANT
SUT MONOCRYL 0 CTX 36 (SUTURE) ×6
SUT PLAIN 2 0 XLH (SUTURE) ×2 IMPLANT
SUT VIC AB 0 CTX 36 (SUTURE) ×6
SUT VIC AB 0 CTX36XBRD ANBCTRL (SUTURE) ×2 IMPLANT
SUT VIC AB 4-0 KS 27 (SUTURE) ×2 IMPLANT
SYR CONTROL 10ML LL (SYRINGE) ×3 IMPLANT
TOWEL OR 17X24 6PK STRL BLUE (TOWEL DISPOSABLE) ×3 IMPLANT
TRAXI PANNICULUS RETRACTOR (MISCELLANEOUS) ×2
TRAY FOLEY CATH SILVER 14FR (SET/KITS/TRAYS/PACK) ×3 IMPLANT

## 2016-04-24 NOTE — Progress Notes (Signed)
MOB was referred for history of depression/anxiety. * Referral screened out by Clinical Social Worker because none of the following criteria appear to apply: ~ History of anxiety/depression during this pregnancy, or of post-partum depression. ~ Diagnosis of anxiety and/or depression within last 3 years OR * MOB's symptoms currently being treated with medication and/or therapy. Please contact the Clinical Social Worker if needs arise, or if MOB requests.  MOB has Rx for Lexapro. 

## 2016-04-24 NOTE — Anesthesia Postprocedure Evaluation (Signed)
Anesthesia Post Note  Patient: Andrea [name]  Procedure(s) Performed: Procedure(s) (LRB): CESAREAN SECTION (N/A)  Patient location during evaluation: PACU Anesthesia Type: Spinal Level of consciousness: awake Pain management: pain level controlled Vital Signs Assessment: post-procedure vital signs reviewed and stable Respiratory status: spontaneous breathing Cardiovascular status: stable Postop Assessment: no headache, no backache, spinal receding, patient able to bend at knees and no signs of nausea or vomiting Anesthetic complications: no     Last Vitals:  Filed Vitals:   04/24/16 0946 04/24/16 1038  BP:  117/77  Pulse: 69 67  Temp:  36.6 C  Resp: 23 18    Last Pain:  Filed Vitals:   04/24/16 1038  PainSc: 0-No pain   Pain Goal:                 Dionte Blaustein JR,JOHN Darsi Tien

## 2016-04-24 NOTE — Anesthesia Procedure Notes (Signed)
Spinal Patient location during procedure: OR Start time: 04/24/2016 7:37 AM End time: 04/24/2016 7:42 AM Staffing Anesthesiologist: Leilani AbleHATCHETT, Sonnie Pawloski Performed by: anesthesiologist  Preanesthetic Checklist Completed: patient identified, surgical consent, pre-op evaluation, timeout performed, IV checked, risks and benefits discussed and monitors and equipment checked Spinal Block Patient position: sitting Prep: site prepped and draped and DuraPrep Patient monitoring: heart rate, cardiac monitor, continuous pulse ox and blood pressure Approach: midline Location: L3-4 Injection technique: single-shot Needle Needle type: Sprotte  Needle gauge: 24 G Needle length: 9 cm Needle insertion depth: 6 cm Assessment Sensory level: T6

## 2016-04-24 NOTE — Lactation Note (Signed)
This note was copied from a baby's chart. Lactation Consultation Note initial visit at 10 hours of age.  Baby is 6763w0d and 5 #11oz.  Mom reports history of PCOS and weight loss of over 160#s.  Mom has loose skin on widely spaced breasts with limited glandular tissue. Mom has older child born at 3734 weeks with limited pumping and low milk supply after "infection" at a few days.  Discussed at length late preterm status, low birth weight and PCOS.  Mom has a pump at bedside, but has not began pumping yet.  Mom holding baby STS after bath and then attempted latch in "laid back" position.  Baby did not latch well, but drops of colostrum expressed to mouth.  Rn at bedside to assist with DEBP set up.  [facility] resources given and discussed.  Encouraged to feed with early cues on demand.  Early newborn behavior discussed.  Hand expression demonstrated with colostrum visible.  Mom to call for assist as needed.   Plan is for mom to follow late preterm policy and begin supplementing with EBM and/or formula as needed.  Mom to pump every 3 hours or 8X/24hours followed by hand expression.  Mom aware to limit feeding times to 30 mintues with supplement.    Patient Name: Andrea [name] Today's Date: 04/24/2016 Reason for consult: Initial assessment   Maternal Data Has patient been taught Hand Expression?: Yes Does the patient have breastfeeding experience prior to this delivery?: Yes  Feeding Feeding Type: Breast Fed  LATCH Score/Interventions Latch: Too sleepy or reluctant, no latch achieved, no sucking elicited. Intervention(s): Skin to skin;Teach feeding cues;Waking techniques  Audible Swallowing: None (drops expressed)  Type of Nipple: Everted at rest and after stimulation  Comfort (Breast/Nipple): Soft / non-tender     Hold (Positioning): Assistance needed to correctly position infant at breast and maintain latch. Intervention(s): Breastfeeding basics reviewed;Support Pillows;Position options;Skin  to skin  LATCH Score: 5  Lactation Tools Discussed/Used WIC Program: Yes Pump Review: Setup, frequency, and cleaning;Milk Storage Initiated by:: RN/LC Date initiated:: 04/24/16   Consult Status Consult Status: Follow-up Date: 04/25/16 Follow-up type: In-patient    Beverely RisenShoptaw, Arvella MerlesJana Lynn 04/24/2016, 6:39 PM

## 2016-04-24 NOTE — Anesthesia Postprocedure Evaluation (Signed)
Anesthesia Post Note  Patient: Andrea [name]  Procedure(s) Performed: Procedure(s) (LRB): CESAREAN SECTION (N/A)  Patient location during evaluation: Mother Baby Anesthesia Type: Spinal Level of consciousness: awake and alert and oriented Pain management: satisfactory to patient Vital Signs Assessment: post-procedure vital signs reviewed and stable Respiratory status: spontaneous breathing and nonlabored ventilation Cardiovascular status: stable Postop Assessment: no headache, no backache, patient able to bend at knees, no signs of nausea or vomiting and adequate PO intake Anesthetic complications: no     Last Vitals:  Filed Vitals:   04/24/16 1245 04/24/16 1345  BP: 112/60 126/55  Pulse: 75 73  Temp: 36.5 C 36.7 C  Resp: 18 18    Last Pain:  Filed Vitals:   04/24/16 1354  PainSc: 2    Pain Goal: Patients Stated Pain Goal: 3 (04/24/16 1345)               Madison HickmanGREGORY,Day Deery

## 2016-04-24 NOTE — Op Note (Signed)
Cesarean Section Procedure Note   Andrea [name]   04/19/2016 - 04/24/2016  Indications: Oligohydramnios   Pre-operative Diagnosis: oligohydramnios.   Post-operative Diagnosis: Same   Surgeon: HARPER,CHARLES A  Assistants: MARSHALL, BERNARD A.  Anesthesia: spinal  Procedure Details:  The patient was seen in the Holding Room. The risks, benefits, complications, treatment options, and expected outcomes were discussed with the patient. The patient concurred with the proposed plan, giving informed consent. The patient was identified as Andrea [name] and the procedure verified as C-Section Delivery. A Time Out was held and the above information confirmed.  After induction of anesthesia, the patient was draped and prepped in the usual sterile manner. A transverse incision was made and carried down through the subcutaneous tissue to the fascia. The fascial incision was made and extended transversely. The fascia was separated from the underlying rectus tissue superiorly and inferiorly. The peritoneum was identified and entered. The peritoneal incision was extended longitudinally. The utero-vesical peritoneal reflection was incised transversely and the bladder flap was bluntly freed from the lower uterine segment. A low transverse uterine incision was made. Delivered from cephalic presentation was a 2645 gram living newborn female infant(s). APGAR (1 MIN): 9   APGAR (5 MINS): 9   APGAR (10 MINS):    A cord ph was not sent. The umbilical cord was clamped and cut cord. A sample was obtained for evaluation. The placenta was removed Intact and appeared normal.  The uterine incision was closed with running locked sutures of 0 Monocryl. A second imbricating layer of the same suture was placed.  Hemostasis was observed. The paracolic gutters were irrigated. The parieto peritoneum was closed in a running fashion with 2-0 Vicryl.  The fascia was then reapproximated with running sutures of 0 Vicryl.  The skin  was closed with sutures.  Instrument, sponge, and needle counts were correct prior the abdominal closure and were correct at the conclusion of the case.    Findings:  Normal uterus, ovaries and tubes   Estimated Blood Loss: 750ml  Total IV Fluids: 1400ml   Urine Output: 300CC OF clear urine  Specimens:  Placenta to Pathology  Complications: no complications  Disposition: PACU - hemodynamically stable.  Maternal Condition: stable   Baby condition / location:  Couplet care / Skin to Skin    Signed: Surgeon(s): Brock Badharles A Harper, MD Kathreen CosierBernard A Marshall, MD

## 2016-04-24 NOTE — Progress Notes (Signed)
Reported pt pain of 8 after getting up, VS, med allergies, toradol 30mg  at 0902, tylenol 650mg  at 1245. Received one time dilaudid 1mg  IV now.

## 2016-04-24 NOTE — Progress Notes (Signed)
Patient ID: Andrea [name], female   DOB: 1981/02/23, 35 y.o.   MRN: [id number] Hospital Day: 6  S: No complaints.  O: Blood pressure 120/68, pulse 82, temperature 98.1 F (36.7 C), temperature source Oral, resp. rate 18, height 5\' 7"  (1.702 m), weight 223 lb (101.152 kg), last menstrual period 09/21/2015, SpO2 99 %.   WUJ:WJXBJYNWFHT:Baseline: 140 bpm, Variability: Good {> 6 bpm), Accelerations: Reactive and Decelerations: Absent Toco: None GNF:AOZHYQMVSVE:Dilation: 1 Effacement (%): Thick Cervical Position: Posterior Exam by:: E.Lawrence, NP  A/P- 35 y.o. admitted with:  Oligohydramnios.  C/S at 36 weeks per recommendation of MFM.  Present on Admission:  . Oligohydramnios  Pregnancy Complications: oligohydramnios  Preterm labor management: bedrest advised Dating:  6089w0d PNL Needed:  CBC FWB:  good PTL:  stable

## 2016-04-24 NOTE — Transfer of Care (Signed)
Immediate Anesthesia Transfer of Care Note  Patient: Andrea [name]  Procedure(s) Performed: Procedure(s): CESAREAN SECTION (N/A)  Patient Location: PACU  Anesthesia Type:Spinal  Level of Consciousness: awake  Airway & Oxygen Therapy: Patient Spontanous Breathing  Post-op Assessment: Report given to RN  Post vital signs: Reviewed and stable  Last Vitals:  Filed Vitals:   04/23/16 2332 04/24/16 0550  BP: 90/42 120/68  Pulse: 80 82  Temp:  36.7 C  Resp:  18    Last Pain:  Filed Vitals:   04/24/16 0839  PainSc: 0-No pain         Complications: No apparent anesthesia complications

## 2016-04-24 NOTE — Addendum Note (Signed)
Addendum  created 04/24/16 1526 by Shanon PayorSuzanne M Liberti Appleton, CRNA   Modules edited: Clinical Notes   Clinical Notes:  File: [id number]

## 2016-04-25 ENCOUNTER — Encounter (HOSPITAL_COMMUNITY): Payer: Self-pay | Admitting: *Deleted

## 2016-04-25 ENCOUNTER — Ambulatory Visit: Payer: Medicaid Other

## 2016-04-25 ENCOUNTER — Encounter: Payer: Medicaid Other | Admitting: Certified Nurse Midwife

## 2016-04-25 LAB — CBC
HCT: 29.8 % — ABNORMAL LOW (ref 36.0–46.0)
Hemoglobin: 10.3 g/dL — ABNORMAL LOW (ref 12.0–15.0)
MCH: 30.4 pg (ref 26.0–34.0)
MCHC: 34.6 g/dL (ref 30.0–36.0)
MCV: 87.9 fL (ref 78.0–100.0)
PLATELETS: 210 10*3/uL (ref 150–400)
RBC: 3.39 MIL/uL — ABNORMAL LOW (ref 3.87–5.11)
RDW: 13.2 % (ref 11.5–15.5)
WBC: 14.8 10*3/uL — ABNORMAL HIGH (ref 4.0–10.5)

## 2016-04-25 MED ORDER — OXYCODONE-ACETAMINOPHEN 5-325 MG PO TABS
2.0000 | ORAL_TABLET | ORAL | Status: DC
  Administered 2016-04-25 – 2016-04-27 (×10): 2 via ORAL
  Filled 2016-04-25 (×11): qty 2

## 2016-04-25 NOTE — Progress Notes (Signed)
Subjective: Postpartum Day #1: Cesarean Delivery Patient reports incisional pain and tolerating PO.    Objective: Vital signs in last 24 hours: Temp:  [97.7 F (36.5 C)-98.4 F (36.9 C)] 98.4 F (36.9 C) (05/25 0800) Pulse Rate:  [65-108] 80 (05/25 0800) Resp:  [18-20] 20 (05/25 0800) BP: (111-139)/(51-90) 127/51 mmHg (05/25 0800) SpO2:  [95 %-99 %] 95 % (05/25 0315)  Physical Exam:  General: alert, cooperative and no distress Lochia: appropriate Uterine Fundus: firm Incision: no significant drainage DVT Evaluation: No evidence of DVT seen on physical exam. No cords or calf tenderness. No significant calf/ankle edema.   Recent Labs  04/24/16 0550 04/25/16 0546  HGB 13.8 10.3*  HCT 38.9 29.8*    Assessment/Plan: Status post Cesarean section. Postoperative course complicated by post operative pain, percocet ordered in addition to dilauded.  Patient has taken percocet before.  Continue current care.  Roe Coombsachelle A Alisha Burgo, CNM 04/25/2016, 12:15 PM

## 2016-04-25 NOTE — Lactation Note (Signed)
This note was copied from a baby's chart. Lactation Consultation Note  Patient Name: Andrea [name] Date: 04/25/2016 Reason for consult: Follow-up assessment Baby at 33 hr of life. Mom has stopped latching because she did not think it was going well. She has only pumped 1 time today. She is not sure that she wants to bf. She stated she has PCOS and can not tell what the baby is taking in when he latches. She is not sure she wants the "strss" of pumping to feed. She has a Hx of mastitis and is worried she will get it again. Discussed breast changes. She is aware of lactation services and support group. She will call as needed.   Maternal Data    Feeding Feeding Type: Bottle Fed - Formula  LATCH Score/Interventions                      Lactation Tools Discussed/Used     Consult Status Consult Status: PRN    [name] 04/25/2016, 6:03 PM

## 2016-04-26 NOTE — Progress Notes (Signed)
Subjective: Postpartum Day 2: Cesarean Delivery Patient reports tolerating PO, + flatus and no problems voiding.    Objective: Vital signs in last 24 hours: Temp:  [98.3 F (36.8 C)-98.5 F (36.9 C)] 98.3 F (36.8 C) (05/26 16100642) Pulse Rate:  [77-85] 77 (05/26 0642) Resp:  [18-19] 19 (05/26 0642) BP: (124-130)/(70-71) 130/70 mmHg (05/26 [id number])  Physical Exam:  General: alert and no distress Lochia: appropriate Uterine Fundus: firm Incision: healing well DVT Evaluation: No evidence of DVT seen on physical exam.   Recent Labs  04/24/16 0550 04/25/16 0546  HGB 13.8 10.3*  HCT 38.9 29.8*    Assessment/Plan: Status post Cesarean section. Doing well postoperatively.  Continue current care.  Berkley Cronkright A 04/26/2016, 12:04 PM

## 2016-04-27 NOTE — Progress Notes (Signed)
Dr. Gaynell FaceMarshall called to notified him that patient requested percocet script instead of the tylenol #3 prescription due to codeine allergy. Patient was very concerned about her allergy with the Codeine #3. New percocet prescription given per Dr. Gaynell FaceMarshall,  the old tylenol #3 prescription taken and shredded.

## 2016-04-27 NOTE — Progress Notes (Signed)
Patient ID: Andrea [name], female   DOB: Nov 17, 1981, 35 y.o.   MRN: [id number] Postop day 3 Blood pressure 119/65 pulse [name] 1 respiration 18 Abdomen soft Legs negative home today

## 2016-04-27 NOTE — Discharge Instructions (Signed)
Discharge instructions ° °· You can wash your hair °· Shower °· Eat what you want °· Drink what you want °· See me in 6 weeks °· Your ankles are going to swell more in the next 2 weeks than when pregnant °· No sex for 6 weeks ° ° °Kelvon Giannini A, MD 04/27/2016 ° ° °

## 2016-04-27 NOTE — Discharge Summary (Signed)
Obstetric Discharge Summary Reason for Admission: cesarean section Prenatal Procedures: none Intrapartum Procedures: cesarean: low cervical, transverse Postpartum Procedures: none Complications-Operative and Postpartum: none HEMOGLOBIN  Date Value Ref Range Status  04/25/2016 10.3* 12.0 - 15.0 g/dL Final    Comment:    REPEATED TO VERIFY DELTA CHECK NOTED    HCT  Date Value Ref Range Status  04/25/2016 29.8* 36.0 - 46.0 % Final   HEMATOCRIT  Date Value Ref Range Status  02/28/2016 36.3 34.0 - 46.6 % Final    Physical Exam:  General: alert Lochia: appropriate Uterine Fundus: firm Incision: healing well DVT Evaluation: No evidence of DVT seen on physical exam.  Discharge Diagnoses: Term Pregnancy-delivered  Discharge Information: Date: 04/27/2016 Activity: pelvic rest Diet: routine Medications: Tylenol #3 Condition: stable Instructions: refer to practice specific booklet Discharge to: home Follow-up Information    Follow up with HARPER,CHARLES A, MD.   Specialty:  Obstetrics and Gynecology   Contact information:   [address] [place] [address] [phone]       Newborn Data: Live born female  Birth Weight: 5 lb 13.3 oz (2645 g) APGAR: 9, 9  Home with mother.  Neithan Day A 04/27/2016, 6:20 AM

## 2016-04-27 NOTE — Lactation Note (Signed)
This note was copied from a baby's chart. Lactation Consultation Note  Patient Name: Andrea [name] Reason for consult: Follow-up assessment;Late preterm infant;Infant < 6lbs;Infant weight loss;Other (Comment) (7% weight loss, mom hx PCOS )  Baby is 2176 hour old and for D/C today. Baby has been breast and bottle ( supplemented with Neosure 22 cal ) as a feeding or after breast feeding.  LC reviewed basics of supply and demand, and due to her hx of PCOS , and baby being a LPT , < 6 pounds , the importance of challenging her breast tissue  Pumping , therefore challenging her brain to enhance volume. [facility],. Per mom I'm not sure [facility] , because  I want formula from them and they don't do both. Per mom I haven't post pumped much in the last 24 hours.  LC made recommendations to enhance milk supply. Mom seems so undecided , [facility], and requested hand pump.  LC instructed mom on the use hand pump. Sore nipple and engorgement prevention and tx reviewed.  Referring to the baby and me booklet.  Mother informed of post-discharge support and given phone number to the lactation department, including services for phone call assistance; out-patient appointments; and breastfeeding support group. List of other breastfeeding resources in the community given in the handout. Encouraged mother to call for problems or concerns related to breastfeeding.   Maternal Data    Feeding Feeding Type: Breast Fed Length of feed: 10 min (muliply swallows noted, latched with depth )  LATCH Score/Interventions Latch: Grasps breast easily, tongue down, lips flanged, rhythmical sucking. Intervention(s): Skin to skin;Teach feeding cues;Waking techniques  Audible Swallowing: Spontaneous and intermittent  Type of Nipple: Everted at rest and after stimulation  Comfort (Breast/Nipple): Soft / non-tender     Hold  (Positioning): Assistance needed to correctly position infant at breast and maintain latch. Intervention(s): Breastfeeding basics reviewed (see LC note )  LATCH Score: 9  Lactation Tools Discussed/Used Tools: Pump (per moms request ) Breast pump type: Manual WIC Program: Yes (mom declined WIC loaner and LC [facility] )   Consult Status Consult Status: Complete Date: 04/27/16 Follow-up type: In-patient    Andrea Greathouseorio, [name] [name], 12:20 PM

## 2016-05-01 ENCOUNTER — Other Ambulatory Visit: Payer: Self-pay | Admitting: Obstetrics

## 2016-05-01 ENCOUNTER — Telehealth: Payer: Self-pay | Admitting: *Deleted

## 2016-05-01 NOTE — Telephone Encounter (Signed)
Patient is complaining of severe pain in her lower abdomen. She has not moved her bowels since she was discharged from the hospital. Told patient that I would talk to Dr Clearance CootsHarper about getting her some medication for pain- also discussed constipation management. Patient states her incision looks clean and dry.

## 2016-05-02 ENCOUNTER — Encounter: Payer: Self-pay | Admitting: Obstetrics

## 2016-05-02 ENCOUNTER — Other Ambulatory Visit: Payer: Self-pay | Admitting: Obstetrics

## 2016-05-02 ENCOUNTER — Ambulatory Visit (INDEPENDENT_AMBULATORY_CARE_PROVIDER_SITE_OTHER): Payer: Medicaid Other | Admitting: Obstetrics

## 2016-05-02 ENCOUNTER — Ambulatory Visit: Payer: Medicaid Other

## 2016-05-02 DIAGNOSIS — G8918 Other acute postprocedural pain: Secondary | ICD-10-CM

## 2016-05-02 MED ORDER — OXYCODONE-ACETAMINOPHEN 10-325 MG PO TABS: 1.0000 | ORAL_TABLET | ORAL | Status: DC | PRN

## 2016-05-03 ENCOUNTER — Encounter: Payer: Self-pay | Admitting: Obstetrics

## 2016-05-03 NOTE — Progress Notes (Signed)
Subjective:     Andrea [name] is a [age] y.o. female who presents for a postpartum visit. She is 1 week postpartum following a low cervical transverse Cesarean section. I have fully reviewed the prenatal and intrapartum course. The delivery was at 36 gestational weeks. Outcome: repeat cesarean section, low transverse incision. Anesthesia: spinal. Postpartum course has been normal. Baby's course has been normal. Baby is feeding by breast. Bleeding thin lochia. Bowel function is normal. Bladder function is normal. Patient is not sexually active. Contraception method is abstinence. Postpartum depression screening: negative.  Tobacco, alcohol and substance abuse history reviewed.  Adult immunizations reviewed including TDAP, rubella and varicella.  The following portions of the patient's history were reviewed and updated as appropriate: allergies, current medications, past family history, past medical history, past social history, past surgical history and problem list.  Review of Systems A comprehensive review of systems was negative except for: Gastrointestinal: positive for abdominal pain and incisional burning   Objective:    BP 136/83 mmHg  Pulse 121  Temp(Src) 99.1 F (37.3 C)  Wt 219 lb (99.338 kg)  General:  alert   Breasts:  inspection negative, no nipple discharge or bleeding, no masses or nodularity palpable  Lungs: clear to auscultation bilaterally  Heart:  regular rate and rhythm, S1, S2 normal, no murmur, click, rub or gallop  Abdomen: normal findings: soft, non-tender and incision C, D, I.     Vulva: Not evaluated     Assessment:     Normal postpartum exam.  Not taking adequate pain meds.  F/U in 1 week.  Plan:    1. Contraception: Considering options 2. Percocet Rx 3. Follow up in: 1 week or as needed.   Healthy lifestyle practices reviewed

## 2016-05-08 ENCOUNTER — Ambulatory Visit (INDEPENDENT_AMBULATORY_CARE_PROVIDER_SITE_OTHER): Payer: Medicaid Other | Admitting: Certified Nurse Midwife

## 2016-05-08 ENCOUNTER — Encounter: Payer: Self-pay | Admitting: Certified Nurse Midwife

## 2016-05-08 DIAGNOSIS — IMO0001 Reserved for inherently not codable concepts without codable children: Secondary | ICD-10-CM

## 2016-05-08 DIAGNOSIS — T814XXA Infection following a procedure, initial encounter: Secondary | ICD-10-CM | POA: Diagnosis not present

## 2016-05-08 DIAGNOSIS — T8149XA Infection following a procedure, other surgical site, initial encounter: Secondary | ICD-10-CM | POA: Insufficient documentation

## 2016-05-08 DIAGNOSIS — Z1389 Encounter for screening for other disorder: Secondary | ICD-10-CM

## 2016-05-08 LAB — POCT URINALYSIS DIPSTICK
BILIRUBIN UA: NEGATIVE
Glucose, UA: NEGATIVE
KETONES UA: NEGATIVE
Nitrite, UA: NEGATIVE
PH UA: 5
PROTEIN UA: NEGATIVE
SPEC GRAV UA: 1.01
Urobilinogen, UA: NEGATIVE

## 2016-05-08 MED ORDER — IBUPROFEN 800 MG PO TABS: 800.0000 mg | ORAL_TABLET | Freq: Three times a day (TID) | ORAL | Status: DC | PRN

## 2016-05-08 MED ORDER — AMOXICILLIN-POT CLAVULANATE 875-125 MG PO TABS: 1.0000 | ORAL_TABLET | Freq: Two times a day (BID) | ORAL | Status: AC

## 2016-05-08 MED ORDER — TRIPLE ANTIBIOTIC 5-400-5000 EX OINT: TOPICAL_OINTMENT | Freq: Four times a day (QID) | CUTANEOUS | Status: DC

## 2016-05-08 MED ORDER — CEFTRIAXONE SODIUM 1 G IJ SOLR
250.0000 mg | Freq: Once | INTRAMUSCULAR | Status: AC
  Administered 2016-05-08: 250 mg via INTRAMUSCULAR

## 2016-05-08 MED ORDER — OXYCODONE-ACETAMINOPHEN 5-325 MG PO TABS: 1.0000 | ORAL_TABLET | ORAL | Status: DC | PRN

## 2016-05-08 MED ORDER — MUPIROCIN 2 % EX OINT: 1.0000 "application " | TOPICAL_OINTMENT | Freq: Two times a day (BID) | CUTANEOUS | Status: DC | PRN

## 2016-05-08 NOTE — Progress Notes (Signed)
Patient ID: Andrea [name], female   DOB: October 03, 1981, 35 y.o.   MRN: [id number]   Chief Complaint  Patient presents with  . Postpartum Care    HPI Andrea [name] is a 35 y.o. female.  Had C-section 04/24/16, until now has not had any complications.  Here for exam with sons.  Infant to have circumcision today.  States that she has had severe abdominal tenderness and pain on the right side of her incision that has gotten worse over the last few days. Denies any fever.  States that it is hard on the right side of her incision like a knot.  States that she is also having shooting pains on that side of the incision.  Otherwise is doing well.  Does not have family to help with the children during the day her spouse is at work.  Does have a hx of depression is on antidepressants.      HPI  Past Medical History  Diagnosis Date  . Polycystic ovarian disease   . Psoriasis   . Rosacea   . Anxiety and depression     see progress notes -sw    Past Surgical History  Procedure Laterality Date  . Tonsillectomy and adenoidectomy    . Cholecystectomy    . Cesarean section N/A 04/24/2016    Procedure: CESAREAN SECTION;  Surgeon: Brock Badharles A Harper, MD;  [facility];  Service: Obstetrics;  Laterality: N/A;    Family History  Problem Relation Age of Onset  . Cancer Mother   . Cancer Father   . Cancer Maternal Grandmother     Social History Social History  Substance Use Topics  . Smoking status: Former Smoker    Quit date: 10/03/2015  . Smokeless tobacco: Never Used  . Alcohol Use: No    Allergies  Allergen Reactions  . Codeine Nausea And Vomiting  . Hydrocodone Itching    itching  . Morphine And Related Nausea And Vomiting    Current Outpatient Prescriptions  Medication Sig Dispense Refill  . amoxicillin-clavulanate (AUGMENTIN) 875-125 MG tablet Take 1 tablet by mouth 2 (two) times daily. 28 tablet 0  . ibuprofen (ADVIL,MOTRIN) 800 MG tablet Take 1 tablet (800 mg  total) by mouth every 8 (eight) hours as needed. 60 tablet 1  . mupirocin ointment (BACTROBAN) 2 % Apply 1 application topically 2 (two) times daily as needed. 30 g PRN  . neomycin-bacitracin-polymyxin (NEOSPORIN) [phone] ointment Apply topically 4 (four) times daily. 28.3 g 0  . oxyCODONE-acetaminophen (PERCOCET/ROXICET) 5-325 MG tablet Take 1-2 tablets by mouth every 4 (four) hours as needed for severe pain. 45 tablet 0   No current facility-administered medications for this visit.    Review of Systems Review of Systems Constitutional: negative for fatigue and weight loss Respiratory: negative for cough and wheezing Cardiovascular: negative for chest pain, fatigue and palpitations Gastrointestinal: + for abdominal pain on right side of C-section incision and change in bowel habits Genitourinary:negative Integument/breast: negative for nipple discharge Musculoskeletal:negative for myalgias Neurological: negative for gait problems and tremors Behavioral/Psych: negative for abusive relationship, depression Endocrine: negative for temperature intolerance     Blood pressure 129/83, pulse 116, temperature 98.6 F (37 C), weight 218 lb (98.884 kg), unknown if currently breastfeeding.  Physical Exam Physical Exam General:   alert  Skin:   no rash or abnormalities  Lungs:   clear to auscultation bilaterally  Heart:   regular rate and rhythm, S1, S2 normal, no murmur, click, rub or gallop  Breasts:   deferred  Abdomen:  no organomegaly, soft and no hernia present.  C-section incision + beefy red appearance along incisional scar, Has 4+ cm erythema on right side ascending from scar towards umbilicus, tender to palpation   Pelvis:  External genitalia: normal general appearance Urinary system: urethral meatus normal and bladder without fullness, nontender Vaginal: normal without tenderness, induration or masses, scant amount of vaginal bleeding present.     50% of 25 min visit spent on  counseling and coordination of care.   Data Reviewed Previous medical hx, meds  Assessment     Acute wound celluitis     Plan   Rocephin IM X1 now Orders Placed This Encounter  Procedures  . POCT urinalysis dipstick   Meds ordered this encounter  Medications  . amoxicillin-clavulanate (AUGMENTIN) 875-125 MG tablet    Sig: Take 1 tablet by mouth 2 (two) times daily.    Dispense:  28 tablet    Refill:  0  . oxyCODONE-acetaminophen (PERCOCET/ROXICET) 5-325 MG tablet    Sig: Take 1-2 tablets by mouth every 4 (four) hours as needed for severe pain.    Dispense:  45 tablet    Refill:  0  . ibuprofen (ADVIL,MOTRIN) 800 MG tablet    Sig: Take 1 tablet (800 mg total) by mouth every 8 (eight) hours as needed.    Dispense:  60 tablet    Refill:  1  . mupirocin ointment (BACTROBAN) 2 %    Sig: Apply 1 application topically 2 (two) times daily as needed.    Dispense:  30 g    Refill:  PRN  . neomycin-bacitracin-polymyxin (NEOSPORIN) [phone] ointment    Sig: Apply topically 4 (four) times daily.    Dispense:  28.3 g    Refill:  0  . cefTRIAXone (ROCEPHIN) injection 250 mg    Sig:     Order Specific Question:  Antibiotic Indication:    Answer:  Cellulitis     Possible management options include: hospitalization if cellulitis worsens. Return f/u precautions given.  Follow up in 1 week for wound check.

## 2016-05-16 ENCOUNTER — Ambulatory Visit (INDEPENDENT_AMBULATORY_CARE_PROVIDER_SITE_OTHER): Payer: Medicaid Other | Admitting: Certified Nurse Midwife

## 2016-05-16 VITALS — BP 122/83 | HR 76 | Wt 210.0 lb

## 2016-05-16 DIAGNOSIS — B373 Candidiasis of vulva and vagina: Secondary | ICD-10-CM

## 2016-05-16 DIAGNOSIS — IMO0001 Reserved for inherently not codable concepts without codable children: Secondary | ICD-10-CM

## 2016-05-16 DIAGNOSIS — B3731 Acute candidiasis of vulva and vagina: Secondary | ICD-10-CM

## 2016-05-16 DIAGNOSIS — T814XXS Infection following a procedure, sequela: Secondary | ICD-10-CM

## 2016-05-16 DIAGNOSIS — Z9189 Other specified personal risk factors, not elsewhere classified: Secondary | ICD-10-CM

## 2016-05-16 MED ORDER — TERCONAZOLE 0.8 % VA CREA: 1.0000 | TOPICAL_CREAM | Freq: Every day | VAGINAL | Status: DC

## 2016-05-16 MED ORDER — OXYCODONE-ACETAMINOPHEN 10-325 MG PO TABS: 1.0000 | ORAL_TABLET | ORAL | Status: DC | PRN

## 2016-05-16 MED ORDER — METOCLOPRAMIDE HCL 10 MG PO TABS: 20.0000 mg | ORAL_TABLET | Freq: Three times a day (TID) | ORAL | Status: DC

## 2016-05-16 MED ORDER — FLUCONAZOLE 200 MG PO TABS: 200.0000 mg | ORAL_TABLET | Freq: Once | ORAL | Status: DC

## 2016-05-16 MED ORDER — CEFTRIAXONE SODIUM 1 G IJ SOLR
250.0000 mg | Freq: Once | INTRAMUSCULAR | Status: AC
  Administered 2016-05-16: 250 mg via INTRAMUSCULAR

## 2016-05-16 NOTE — Progress Notes (Signed)
Patient ID: Andrea [name], female   DOB: 13-Mar-1981, 35 y.o.   MRN: [id number] Subjective:     Andrea [name] is a 35 y.o. female who presents for a postpartum visit. She is 2 week postpartum following a low cervical transverse Cesarean section. I have fully reviewed the prenatal and intrapartum course. The delivery was at 36 gestational weeks. Outcome: primary cesarean section, low transverse incision. Anesthesia: spinal. Postpartum course has been complicated by c-section incision site cellulitis. Baby's course has been complicated by GI issues. Baby is feeding by both breast and bottle - .. Bleeding thin lochia. Bowel function is normal. Bladder function is normal. Patient is not sexually active. Contraception method is none. Postpartum depression screening: negative.  Tobacco, alcohol and substance abuse history reviewed.  Adult immunizations reviewed including TDAP, rubella and varicella.  The following portions of the patient's history were reviewed and updated as appropriate: allergies, current medications, past family history, past medical history, past social history, past surgical history and problem list.  Review of Systems Pertinent items noted in HPI and remainder of comprehensive ROS otherwise negative.   Objective:    BP 122/83 mmHg  Pulse 76  Wt 210 lb (95.255 kg)  General:  alert, cooperative, appears stated age and no distress   Breasts:  positive findings: decreased milk supply  Lungs: clear to auscultation bilaterally  Heart:  regular rate and rhythm, S1, S2 normal, no murmur, click, rub or gallop  Abdomen: normal findings: bowel sounds normal, umbilicus normal and c-section scar appears to healing normally and abnormal findings:  RLQ TTP, resolving cellutlits superior to C-section scar 4cm from R margin, approximately 2 cm with undefined margins          50% of 25 min visit spent on counseling and coordination of care.  Assessment:     postpartum exam. Pap smear not done  at today's visit.  Plan:    1. Contraception: none 2. Reglan & Fenugreek to increase milk supply. 3. Rocephin IM today. 4. Follow up in: 4 weeks for 6 week Post-partum visit or as needed.  2hr GTT for h/o GDM/screening for DM q 3 yrs per ADA recommendations Preconception counseling provided Healthy lifestyle practices reviewed

## 2016-05-20 ENCOUNTER — Encounter: Payer: Self-pay | Admitting: Certified Nurse Midwife

## 2016-06-05 ENCOUNTER — Ambulatory Visit: Payer: Medicaid Other | Admitting: Certified Nurse Midwife

## 2016-06-11 ENCOUNTER — Other Ambulatory Visit: Payer: Self-pay | Admitting: Certified Nurse Midwife

## 2016-06-13 ENCOUNTER — Encounter: Payer: Self-pay | Admitting: Certified Nurse Midwife

## 2016-06-13 ENCOUNTER — Ambulatory Visit (INDEPENDENT_AMBULATORY_CARE_PROVIDER_SITE_OTHER): Payer: Medicaid Other | Admitting: Certified Nurse Midwife

## 2016-06-13 VITALS — BP 130/84 | HR 84 | Temp 98.9°F | Wt 210.0 lb

## 2016-06-13 DIAGNOSIS — G8929 Other chronic pain: Secondary | ICD-10-CM

## 2016-06-13 DIAGNOSIS — M5441 Lumbago with sciatica, right side: Secondary | ICD-10-CM

## 2016-06-13 DIAGNOSIS — F329 Major depressive disorder, single episode, unspecified: Secondary | ICD-10-CM

## 2016-06-13 DIAGNOSIS — N898 Other specified noninflammatory disorders of vagina: Secondary | ICD-10-CM

## 2016-06-13 DIAGNOSIS — F32A Depression, unspecified: Secondary | ICD-10-CM

## 2016-06-13 DIAGNOSIS — M5442 Lumbago with sciatica, left side: Secondary | ICD-10-CM

## 2016-06-13 DIAGNOSIS — Z01419 Encounter for gynecological examination (general) (routine) without abnormal findings: Secondary | ICD-10-CM

## 2016-06-13 MED ORDER — DULOXETINE HCL 60 MG PO CPEP: 60.0000 mg | ORAL_CAPSULE | Freq: Every day | ORAL | Status: DC

## 2016-06-13 MED ORDER — CYCLOBENZAPRINE HCL 10 MG PO TABS: 10.0000 mg | ORAL_TABLET | Freq: Three times a day (TID) | ORAL | Status: DC | PRN

## 2016-06-13 MED ORDER — GABAPENTIN 600 MG PO TABS: 1200.0000 mg | ORAL_TABLET | Freq: Three times a day (TID) | ORAL | Status: DC

## 2016-06-13 NOTE — Progress Notes (Signed)
Subjective:     Andrea [name] is a 35 y.o. female who presents for a postpartum visit. She is 6 weeks postpartum following a low cervical transverse Cesarean section. I have fully reviewed the prenatal and intrapartum course. The delivery was at 36 gestational weeks. Outcome: repeat cesarean section, low transverse incision. Anesthesia: spinal. Postpartum course has been uncomplicated. Baby's course has been uncomplicated. Baby is feeding by bottle. Bleeding on 1st cycle sine delivery. Bowel function is normal. Bladder function is normal. Patient is not sexually active. Contraception method is abstinence. Postpartum depression screening: negative.  Tobacco, alcohol and substance abuse history reviewed.  Adult immunizations reviewed including TDAP, rubella and varicella.  The following portions of the patient's history were reviewed and updated as appropriate: allergies, current medications, past family history, past medical history, past social history, past surgical history and problem list.  Review of Systems Pertinent items are noted in HPI.   Objective:    There were no vitals taken for this visit.  General:  alert   Breasts:  inspection negative, no nipple discharge or bleeding, no masses or nodularity palpable  Lungs: clear to auscultation bilaterally  Heart:  regular rate and rhythm, S1, S2 normal, no murmur, click, rub or gallop  Abdomen: soft, non-tender; bowel sounds normal; no masses,  no organomegaly   Vulva:  normal  Vagina: not evaluated  Cervix:  not evaluated  Corpus: not examined  Adnexa:  not evaluated  Rectal Exam: Not performed.          50% of 20 min visit spent on counseling and coordination of care.  Assessment:     Normal postpartum exam. Pap smear not done at today's visit.   Doing well. Chronic back pain. Plan:    1. Contraception: abstinence 2. Gabapentin & Cymbalta for back pain. 3. Follow as needed.  2hr GTT for h/o GDM/screening for DM q 3 yrs per  ADA recommendations Preconception counseling provided Healthy lifestyle practices reviewed Follow up in December for annual exam.

## 2016-06-18 LAB — NUSWAB VG+, CANDIDA 6SP
CANDIDA ALBICANS, NAA: NEGATIVE
CANDIDA LUSITANIAE, NAA: NEGATIVE
CHLAMYDIA TRACHOMATIS, NAA: NEGATIVE
Candida glabrata, NAA: NEGATIVE
Candida krusei, NAA: NEGATIVE
Candida parapsilosis, NAA: NEGATIVE
Candida tropicalis, NAA: NEGATIVE
NEISSERIA GONORRHOEAE, NAA: NEGATIVE
Trich vag by NAA: NEGATIVE

## 2016-06-25 ENCOUNTER — Telehealth: Payer: Self-pay | Admitting: *Deleted

## 2016-06-25 NOTE — Telephone Encounter (Signed)
Patient states she is still bleeding- why? 7:07  Patient states she was in the office 7/13 and had been bleeding heavily for a total of 10 days. She did stop for a couple- but then started spotting /show when she wipes ever since. She is due to start another cycle soon. Reviewed lab results and she is going to wait on advisement from provider. She is not using birth control at this time.

## 2016-06-26 ENCOUNTER — Other Ambulatory Visit: Payer: Self-pay | Admitting: Certified Nurse Midwife

## 2016-06-27 ENCOUNTER — Encounter (HOSPITAL_COMMUNITY): Payer: Self-pay | Admitting: Emergency Medicine

## 2016-06-27 ENCOUNTER — Emergency Department (HOSPITAL_COMMUNITY): Payer: Medicaid Other

## 2016-06-27 ENCOUNTER — Emergency Department (HOSPITAL_COMMUNITY)
Admission: EM | Admit: 2016-06-27 | Discharge: 2016-06-27 | Disposition: A | Discharge status: Home / Self Care | Payer: Medicaid Other | Attending: Emergency Medicine | Admitting: Emergency Medicine

## 2016-06-27 DIAGNOSIS — S52001A Unspecified fracture of upper end of right ulna, initial encounter for closed fracture: Secondary | ICD-10-CM | POA: Insufficient documentation

## 2016-06-27 DIAGNOSIS — Y999 Unspecified external cause status: Secondary | ICD-10-CM | POA: Insufficient documentation

## 2016-06-27 DIAGNOSIS — Y9289 Other specified places as the place of occurrence of the external cause: Secondary | ICD-10-CM | POA: Diagnosis not present

## 2016-06-27 DIAGNOSIS — F329 Major depressive disorder, single episode, unspecified: Secondary | ICD-10-CM | POA: Diagnosis not present

## 2016-06-27 DIAGNOSIS — Z87891 Personal history of nicotine dependence: Secondary | ICD-10-CM | POA: Insufficient documentation

## 2016-06-27 DIAGNOSIS — S52201A Unspecified fracture of shaft of right ulna, initial encounter for closed fracture: Secondary | ICD-10-CM

## 2016-06-27 DIAGNOSIS — W010XXA Fall on same level from slipping, tripping and stumbling without subsequent striking against object, initial encounter: Secondary | ICD-10-CM | POA: Insufficient documentation

## 2016-06-27 DIAGNOSIS — Y939 Activity, unspecified: Secondary | ICD-10-CM | POA: Insufficient documentation

## 2016-06-27 DIAGNOSIS — S59911A Unspecified injury of right forearm, initial encounter: Secondary | ICD-10-CM | POA: Diagnosis present

## 2016-06-27 NOTE — Progress Notes (Signed)
Orthopedic Tech Progress Note Patient Details:  Andrea [name] Feb 19, 1981 [id number]  Patient ID: Andrea [name], female   DOB: 1981/05/13, 35 y.o.   MRN: [id number]   Andrea [name] 06/27/2016, 10:51 AM Long arm splint

## 2016-06-27 NOTE — Progress Notes (Signed)
Orthopedic Tech Progress Note Patient Details:  Andrea [name] Sep 05, 1981 [id number]  Ortho Devices Type of Ortho Device: Ace wrap, Long arm splint Ortho Device/Splint Location: rue Ortho Device/Splint Interventions: Application   Andrea [name] 06/27/2016, 10:50 AM

## 2016-06-27 NOTE — ED Notes (Signed)
Called ortho to come place splint and sling. 

## 2016-06-27 NOTE — ED Triage Notes (Signed)
Patient states slipped on water approximately a month and 1/2 ago and landed on her R arm.  Patient states R arm pain.   Patient states swelling and bruising have resolved. Patient states no range of motion problems.

## 2016-06-27 NOTE — ED Provider Notes (Signed)
[facility]-EMERGENCY DEPT Provider Note   CSN: [id number] Arrival date & time: 06/27/16  8127  First Provider Contact:  None  By signing my name below, I, Freida Busman, attest that this documentation has been prepared under the direction and in the presence of non-physician practitioner, Santiago Glad, PA-C. Electronically Signed: Freida Busman, Scribe. 06/27/2016. 9:35 AM.  History   Chief Complaint Chief Complaint  Patient presents with  . Arm Injury   The history is provided by the patient. No language interpreter was used.   HPI Comments:  Andrea [name] is a 35 y.o. female who presents to the Emergency Department complaining of persistent RUE pain s/p mechanical fall ~ 5 weeks ago. Pt states she slipped on water and landed on the right elbow. She notes there was initially swelling and bruising to the site which has greatly improved.  Pt reports exacerbation of pain with certain movements of the extremity. She denies pain to right wrist and shoulder. Pt is right hand dominant. She denies numbness and tingling of the extremity. Pt notes she takes Flexril, gabapentin and cymbalta for her chronic back pain and has also taken ibuprofen which has provided little relief for her RUE pain.   Past Medical History:  Diagnosis Date  . Anxiety and depression    see progress notes -sw  . Polycystic ovarian disease   . Psoriasis   . Rosacea     Patient Active Problem List   Diagnosis Date Noted  . Postoperative wound cellulitis 05/08/2016  . S/P cesarean section 04/24/2016  . Status post primary low transverse cesarean section 04/24/2016  . Oligohydramnios 04/19/2016  . Supervision of pregnancy with history of pre-term labor in first trimester 10/31/2015    Past Surgical History:  Procedure Laterality Date  . CESAREAN SECTION N/A 04/24/2016   Procedure: CESAREAN SECTION;  Surgeon: Brock Bad, MD;  Location: [facility];  Service: Obstetrics;  Laterality: N/A;  .  CHOLECYSTECTOMY    . TONSILLECTOMY AND ADENOIDECTOMY      OB History    Gravida Para Term Preterm AB Living   2 2 0 2 0 2   SAB TAB Ectopic Multiple Live Births   0 0 0 0 2     Home Medications    Prior to Admission medications   Medication Sig Start Date End Date Taking? Authorizing Provider  cyclobenzaprine (FLEXERIL) 10 MG tablet Take 1 tablet (10 mg total) by mouth every 8 (eight) hours as needed for muscle spasms. 06/13/16  Yes Rachelle A Denney, CNM  DULoxetine (CYMBALTA) 60 MG capsule Take 1 capsule (60 mg total) by mouth daily. 06/13/16  Yes Rachelle A Denney, CNM  gabapentin (NEURONTIN) 600 MG tablet Take 2 tablets (1,200 mg total) by mouth 3 (three) times daily. 06/13/16  Yes Rachelle A Denney, CNM  fluconazole (DIFLUCAN) 200 MG tablet Take 1 tablet (200 mg total) by mouth once. Repeat dose in 48-72 hours. Patient not taking: Reported on 06/13/2016 05/16/16   Rachelle A Denney, CNM  ibuprofen (ADVIL,MOTRIN) 800 MG tablet Take 1 tablet (800 mg total) by mouth every 8 (eight) hours as needed. 05/08/16   Rachelle A Denney, CNM  metoCLOPramide (REGLAN) 10 MG tablet Take 2 tablets (20 mg total) by mouth 4 (four) times daily -  before meals and at bedtime. Patient not taking: Reported on 06/13/2016 05/16/16   Roe Coombs, CNM  mupirocin ointment (BACTROBAN) 2 % Apply 1 application topically 2 (two) times daily as needed. Patient not taking: Reported on 06/13/2016  05/08/16   Rachelle A Denney, CNM  neomycin-bacitracin-polymyxin (NEOSPORIN) [phone] ointment Apply topically 4 (four) times daily. Patient not taking: Reported on 06/13/2016 05/08/16   Roe Coombs, CNM    Family History Family History  Problem Relation Age of Onset  . Cancer Mother   . Cancer Father   . Cancer Maternal Grandmother     Social History Social History  Substance Use Topics  . Smoking status: Former Smoker    Quit date: 10/03/2015  . Smokeless tobacco: Never Used  . Alcohol use No     Allergies    Codeine; Hydrocodone; and Morphine and related   Review of Systems Review of Systems  Constitutional: Negative for chills and fever.  Respiratory: Negative for shortness of breath.   Cardiovascular: Negative for chest pain.  Musculoskeletal: Positive for arthralgias and myalgias.  Neurological: Negative for weakness and numbness.     Physical Exam Updated Vital Signs BP 132/82 (BP Location: Right Arm)   Pulse 95   Temp 97.7 F (36.5 C) (Oral)   Resp 18   LMP 06/06/2016   SpO2 100%   Physical Exam  Constitutional: She is oriented to person, place, and time. She appears well-developed and well-nourished. No distress.  HENT:  Head: Normocephalic and atraumatic.  Eyes: Conjunctivae are normal.  Cardiovascular: Normal rate.   Pulses:      Radial pulses are 2+ on the right side, and 2+ on the left side.  Pulmonary/Chest: Effort normal.  Musculoskeletal:  TTP of right olcecranon process; no edema, erythema, or warmth Full extension of the right elbow limited due to pain  FROM of right shoulder and wrist  Neurological: She is alert and oriented to person, place, and time.  Distal sensation of the right hand intact  Skin: Skin is warm and dry.  Psychiatric: She has a normal mood and affect.  Nursing note and vitals reviewed.  ED Treatments / Results  DIAGNOSTIC STUDIES:  Oxygen Saturation is 100% on RA, normal by my interpretation.    COORDINATION OF CARE:  9:31 AM Will order imaging of the RUE. Discussed treatment plan with pt at bedside and pt agreed to plan.  Radiology Dg Elbow Complete Right  Result Date: 06/27/2016 CLINICAL DATA:  Pain following fall 1 month prior EXAM: RIGHT ELBOW - COMPLETE 3+ VIEW COMPARISON:  January 09, 2015 FINDINGS: Frontal, lateral, and bilateral oblique views were obtained. There is a fracture along the proximal most aspect of the ulna with slight displacement of fracture fragments, best appreciable on the frontal view. No other fracture.  No dislocation. No appreciable joint effusion. No joint space narrowing. IMPRESSION: Fracture along the proximal most aspect of the ulna with slightly displaced fracture fragment along the proximal aspect, best seen on the frontal view. No dislocation. No apparent joint space narrowing. Electronically Signed   By: Bretta Bang III M.D.   On: 06/27/2016 10:03   Procedures Procedures   Medications Ordered in ED Medications - No data to display   Initial Impression / Assessment and Plan / ED Course  I have reviewed the triage vital signs and the nursing notes.  Pertinent labs & imaging results that were available during my care of the patient were reviewed by me and considered in my medical decision making (see chart for details).  Clinical Course     Final Clinical Impressions(s) / ED Diagnoses  Patient presents today with right elbow pain s/p fall five weeks ago.  X-Ray showed a fracture along the proximal most  aspect of the ulna with slightly displaced fracture fragment along the proximal aspect.  Pt advised to follow up with orthopedics. Patient given long arm splint and sling while in ED, conservative therapy recommended and discussed. Patient will be discharged home & is agreeable with above plan. Returns precautions discussed. Pt appears safe for discharge.  Final diagnoses:  None    New Prescriptions New Prescriptions   No medications on file   I personally performed the services described in this documentation, which was scribed in my presence. The recorded information has been reviewed and is accurate.     Santiago Glad, PA-C 06/27/16 1317    Santiago Glad, PA-C 06/27/16 1319    Pricilla Loveless, MD 06/27/16 [phone]

## 2016-07-02 NOTE — Telephone Encounter (Signed)
She probably needs some birth control to regulate her cycle.  When is her next appointment?  Thanks R.Masayoshi Couzens CNM

## 2016-07-02 NOTE — Telephone Encounter (Signed)
Call to patient- LM on VM- can use OCP to regulate cycle for a few months and then stop if she wants. Her next appointment is not until December. Told patient to call me back and let me know if OCP were an option she would like to try.

## 2016-07-17 ENCOUNTER — Other Ambulatory Visit: Payer: Self-pay | Admitting: Specialist

## 2016-07-17 DIAGNOSIS — S52221G Displaced transverse fracture of shaft of right ulna, subsequent encounter for closed fracture with delayed healing: Secondary | ICD-10-CM

## 2016-07-29 ENCOUNTER — Ambulatory Visit
Admission: RE | Admit: 2016-07-29 | Discharge: 2016-07-29 | Disposition: A | Discharge status: Home / Self Care | Payer: Medicaid Other | Source: Ambulatory Visit | Attending: Specialist | Admitting: Specialist

## 2016-07-29 DIAGNOSIS — S46391A Other injury of muscle, fascia and tendon of triceps, right arm, initial encounter: Secondary | ICD-10-CM | POA: Diagnosis not present

## 2016-07-29 DIAGNOSIS — S52221G Displaced transverse fracture of shaft of right ulna, subsequent encounter for closed fracture with delayed healing: Secondary | ICD-10-CM | POA: Diagnosis not present

## 2016-07-29 DIAGNOSIS — S56211A Strain of other flexor muscle, fascia and tendon at forearm level, right arm, initial encounter: Secondary | ICD-10-CM | POA: Diagnosis not present

## 2016-07-29 DIAGNOSIS — M25421 Effusion, right elbow: Secondary | ICD-10-CM | POA: Diagnosis not present

## 2016-09-07 IMAGING — US US MFM OB TRANSVAGINAL
1 series · 14 of 14 positions shown · non-contrast
Comparison: none

[Series 1: us mfm ob transvaginal · 14 of 14 slices shown]
[im 1/14]
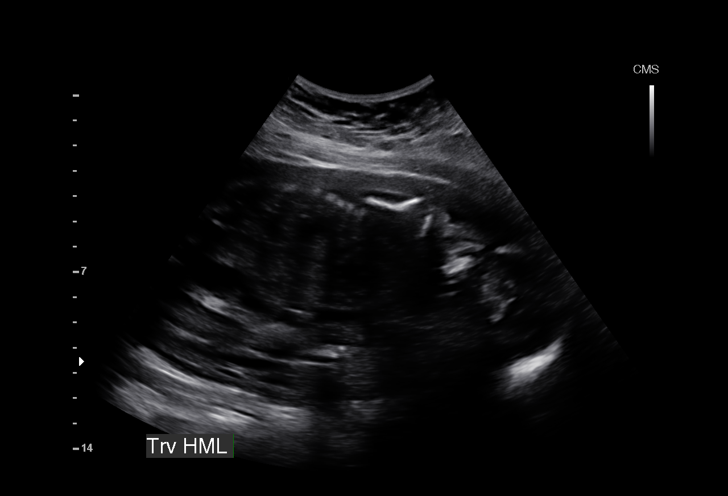
[im 2/14]
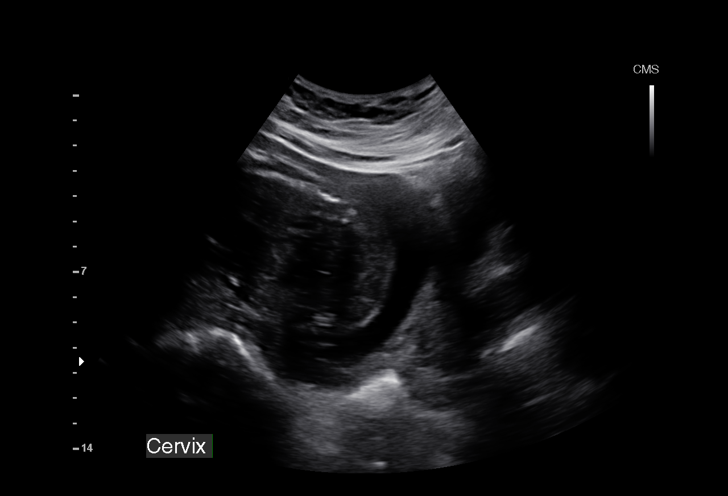
[im 3/14]
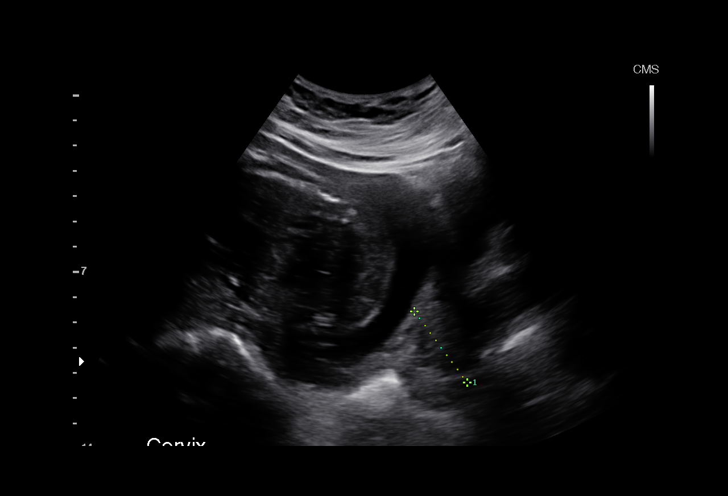
[im 4/14]
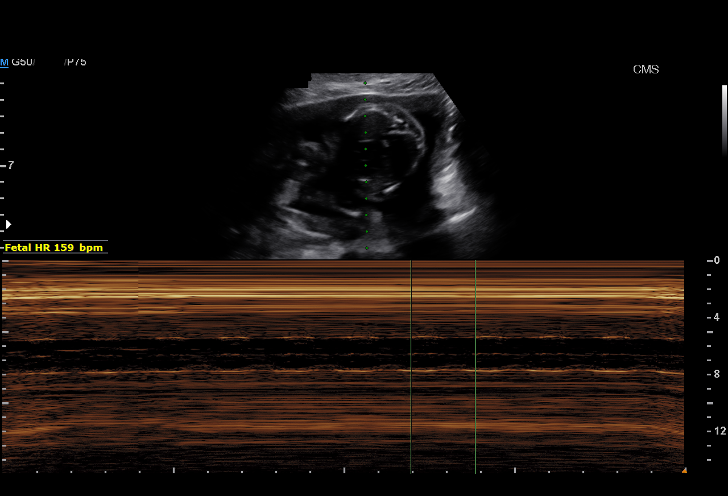
[im 5/14]
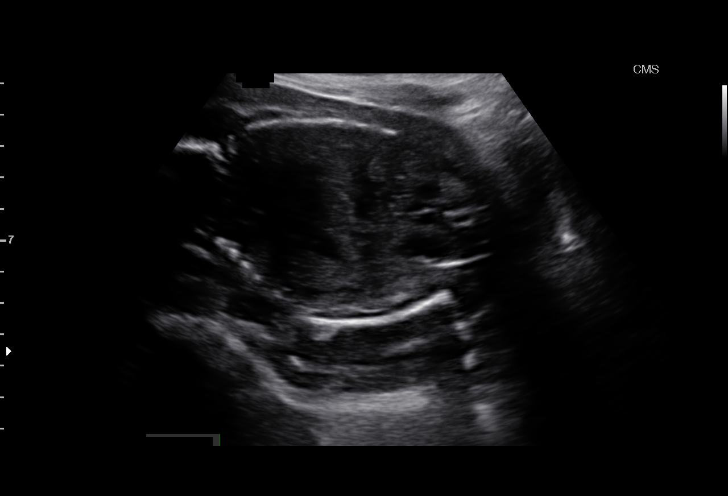
[im 6/14]
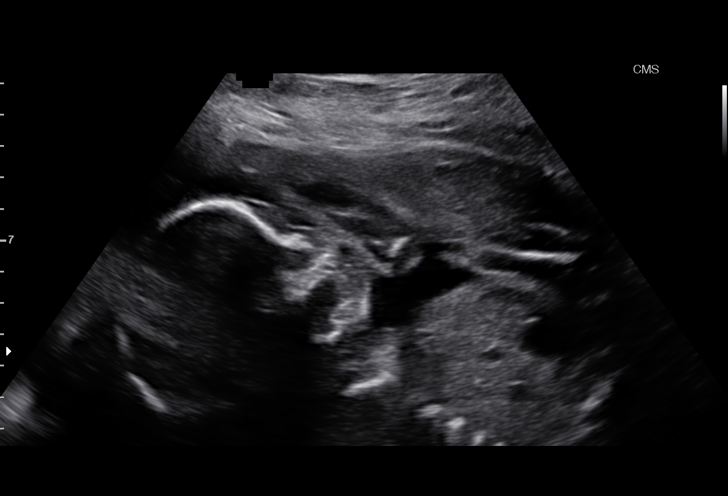
[im 7/14]
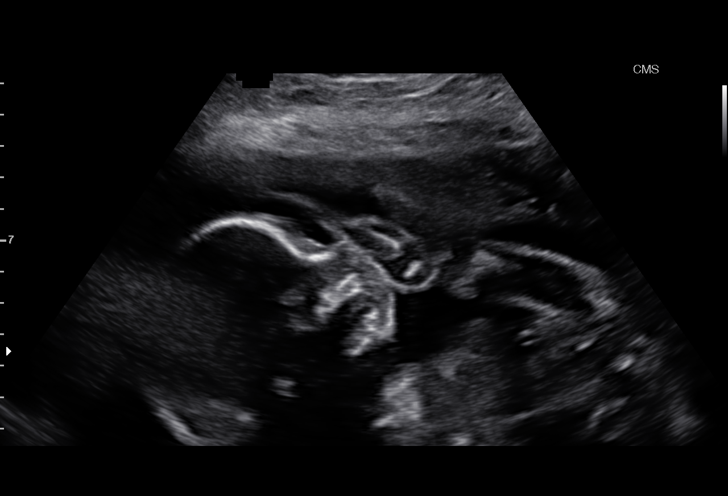
[im 8/14]
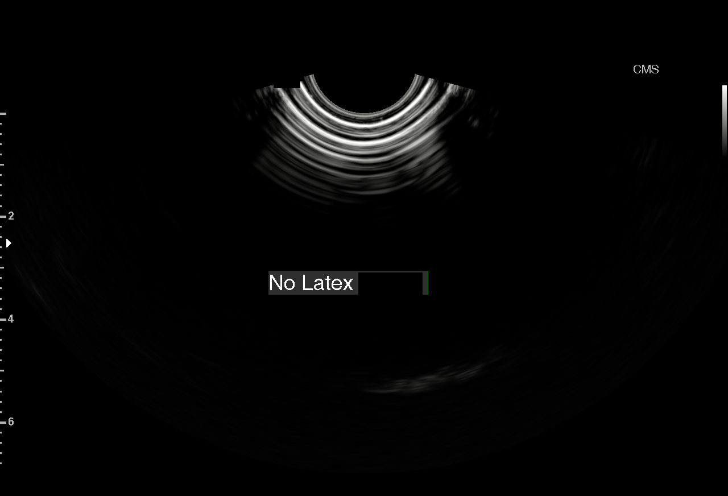
[im 9/14]
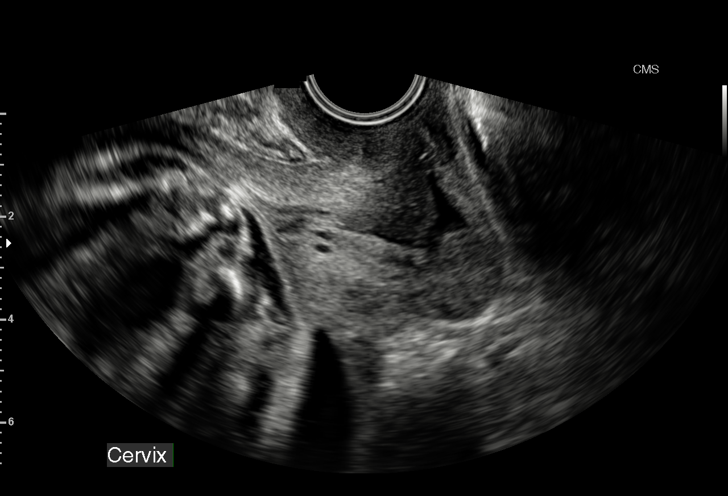
[im 10/14]
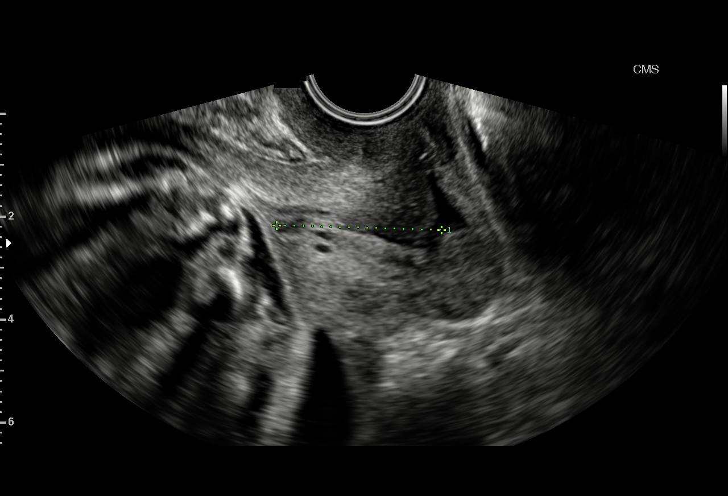
[im 11/14]
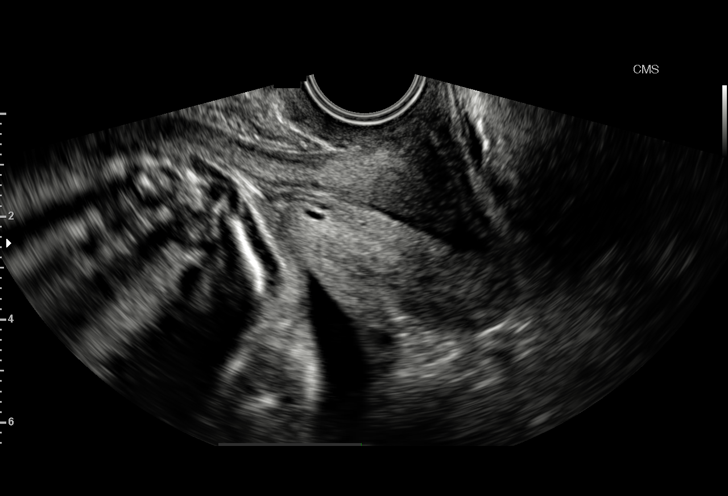
[im 12/14]
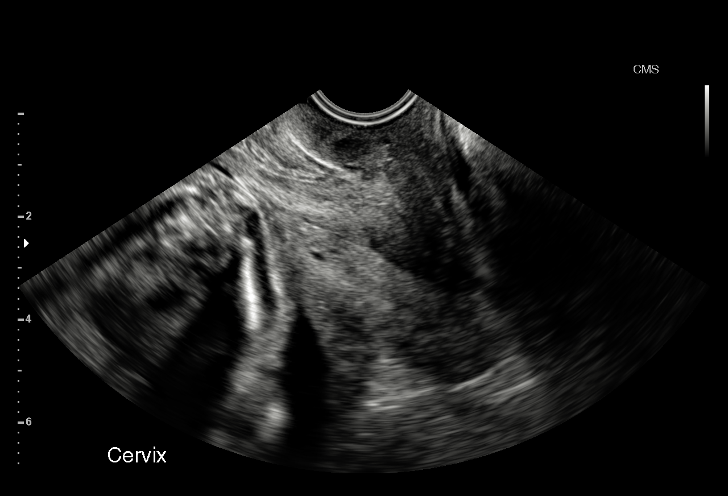
[im 13/14]
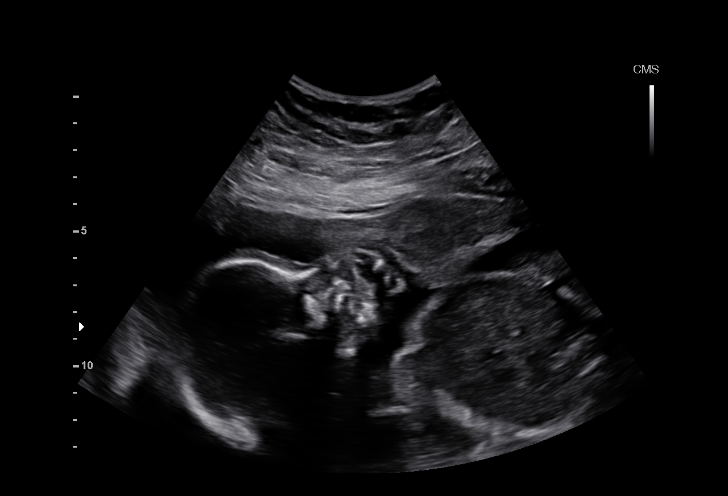
[im 14/14]
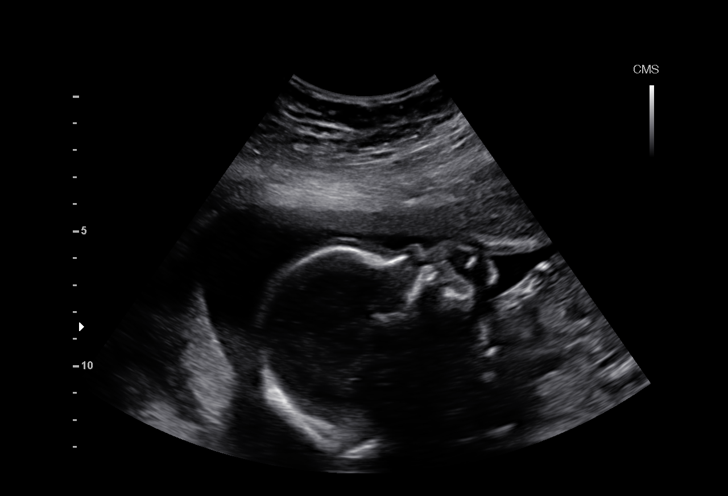

[14 of 14 positions shown; findings below may reference images not displayed]

1  PAULUS N CEEJAY            [id number]      [id number]     [id number]
Indications

Poor obstetric history: Previous preterm
delivery, antepartum (34 weeks); 17P
24 weeks gestation of pregnancy
OB History

Gravidity:    2         Term:   0        Prem:   1        SAB:   0
TOP:          0       Ectopic:  0        Living: 1
Fetal Evaluation

Num Of Fetuses:     1
Fetal Heart         159
Rate(bpm):
Cardiac Activity:   Observed
Presentation:       Variable
Gestational Age

LMP:           19w 0d       Date:   09/21/15                 EDD:   06/27/16
Best:          24w 1d    Det. By:   Early Ultrasound         EDD:   05/22/16
(10/04/15)
Cervix Uterus Adnexa

Cervix
Length:            3.2  cm.
Normal appearance by transvaginal scan
Impression

Single IUP at 24w 1d
Limited ultrasound performed for cervical length

TVUS - cervical length 3.2 cm without funneling or dynamic
changes.  Posterior placenta (resolved low-lying placenta)
Recommendations

Follow-up ultrasounds as clinically indicated.

## 2016-09-23 ENCOUNTER — Ambulatory Visit: Payer: Medicaid Other | Attending: Orthopedic Surgery

## 2016-10-01 ENCOUNTER — Other Ambulatory Visit: Payer: Self-pay | Admitting: Certified Nurse Midwife

## 2016-10-01 DIAGNOSIS — IMO0001 Reserved for inherently not codable concepts without codable children: Secondary | ICD-10-CM

## 2016-10-01 DIAGNOSIS — T814XXA Infection following a procedure, initial encounter: Principal | ICD-10-CM

## 2016-10-01 NOTE — Telephone Encounter (Signed)
Please send refill if approved.

## 2016-10-18 ENCOUNTER — Other Ambulatory Visit: Payer: Self-pay | Admitting: Certified Nurse Midwife

## 2016-10-18 DIAGNOSIS — K219 Gastro-esophageal reflux disease without esophagitis: Secondary | ICD-10-CM

## 2016-10-18 DIAGNOSIS — O99613 Diseases of the digestive system complicating pregnancy, third trimester: Principal | ICD-10-CM

## 2016-10-18 NOTE — Telephone Encounter (Signed)
Please review

## 2016-10-30 ENCOUNTER — Other Ambulatory Visit: Payer: Self-pay | Admitting: Nurse Practitioner

## 2016-10-30 DIAGNOSIS — G43011 Migraine without aura, intractable, with status migrainosus: Secondary | ICD-10-CM

## 2016-11-09 ENCOUNTER — Ambulatory Visit: Payer: Medicaid Other

## 2016-11-13 ENCOUNTER — Ambulatory Visit: Payer: Self-pay | Admitting: Obstetrics & Gynecology

## 2016-11-23 ENCOUNTER — Ambulatory Visit
Admission: RE | Admit: 2016-11-23 | Discharge: 2016-11-23 | Disposition: A | Discharge status: Home / Self Care | Payer: Medicaid Other | Source: Ambulatory Visit | Attending: Nurse Practitioner | Admitting: Nurse Practitioner

## 2016-11-23 DIAGNOSIS — G939 Disorder of brain, unspecified: Secondary | ICD-10-CM | POA: Insufficient documentation

## 2016-11-23 DIAGNOSIS — G43011 Migraine without aura, intractable, with status migrainosus: Secondary | ICD-10-CM | POA: Diagnosis present

## 2016-12-06 ENCOUNTER — Other Ambulatory Visit: Payer: Self-pay | Admitting: Certified Nurse Midwife

## 2016-12-06 DIAGNOSIS — F329 Major depressive disorder, single episode, unspecified: Secondary | ICD-10-CM

## 2016-12-06 DIAGNOSIS — F419 Anxiety disorder, unspecified: Principal | ICD-10-CM

## 2017-01-02 ENCOUNTER — Encounter: Payer: Self-pay | Admitting: Neurology

## 2017-01-02 ENCOUNTER — Ambulatory Visit (INDEPENDENT_AMBULATORY_CARE_PROVIDER_SITE_OTHER): Payer: Medicaid Other | Admitting: Neurology

## 2017-01-02 ENCOUNTER — Telehealth: Payer: Self-pay

## 2017-01-02 VITALS — BP 118/82 | HR 90 | Ht 67.0 in | Wt 213.0 lb

## 2017-01-02 DIAGNOSIS — M542 Cervicalgia: Secondary | ICD-10-CM | POA: Diagnosis not present

## 2017-01-02 DIAGNOSIS — R9089 Other abnormal findings on diagnostic imaging of central nervous system: Secondary | ICD-10-CM

## 2017-01-02 DIAGNOSIS — G43019 Migraine without aura, intractable, without status migrainosus: Secondary | ICD-10-CM

## 2017-01-02 DIAGNOSIS — R9082 White matter disease, unspecified: Secondary | ICD-10-CM | POA: Insufficient documentation

## 2017-01-02 HISTORY — DX: Other abnormal findings on diagnostic imaging of central nervous system: R90.89

## 2017-01-02 HISTORY — DX: Migraine without aura, intractable, without status migrainosus: G43.019

## 2017-01-02 MED ORDER — TOPIRAMATE 25 MG PO TABS: ORAL_TABLET | ORAL | 3 refills | Status: DC

## 2017-01-02 MED ORDER — ALPRAZOLAM 0.5 MG PO TABS: ORAL_TABLET | ORAL | 0 refills | Status: DC

## 2017-01-02 NOTE — Addendum Note (Signed)
Addended by: Stephanie AcreWILLIS, Sherry Rogus on: 01/02/2017 01:28 PM   Modules accepted: Orders

## 2017-01-02 NOTE — Progress Notes (Signed)
Reason for visit: Migraine headache  Referring physician: Dr. Boscia  Andrea [name] is a 36 y.o. female  History of present illness:  Andrea [name] is a 36 year old right-handed white female with a history of migraine headaches since age 36. The patient has had headaches off and on throughout her life, usually the headaches are relatively infrequent, occurring 2 or 3 times a month. The patient began having onset of daily headaches about 4 months ago. The patient has delivered a child 8 months ago. The patient indicates that her headaches are throughout the head associated with a dull pain and throbbing sensation. The patient may have more severe headaches at times associated with significant photophobia and phonophobia, along with nausea and vomiting. The patient does have some neck pain and neck stiffness with the headaches and some intermittent numbness of the hands. The patient has been told that she has carpal tunnel syndrome. The patient may have some blurring of vision with the headache, but no loss of vision. She denies any focal numbness or weakness with the migraine, she does have some difficulty with cognitive processing with the headache. Bright lights and odors are activating factors. The patient has a very strong family history of migraine seen in her mother, maternal grandmother, and one sister. The patient has been using some Maxalt for the headache with some benefit. She denies any problems with balance or difficulty controlling the bowels or the bladder. She does have dizziness and vertigo with the headache. She has had MRI evaluation of the brain done recently that shows multiple white matter abnormalities. The patient is referred to this office in part for this reason. The patient does have some neck discomfort and some low back pain as well.    Past Medical History:  Diagnosis Date  . Anxiety and depression    see progress notes -sw  . Polycystic ovarian disease   . Psoriasis    . Rosacea     Past Surgical History:  Procedure Laterality Date  . CESAREAN SECTION N/A 04/24/2016   Procedure: CESAREAN SECTION;  Surgeon: Brock Badharles A Harper, MD;  [facility];  Service: Obstetrics;  Laterality: N/A;  . CHOLECYSTECTOMY    . TONSILLECTOMY AND ADENOIDECTOMY      Family History  Problem Relation Age of Onset  . Breast cancer Mother   . Bone cancer Mother   . Colon cancer Father   . Prostate cancer Father   . Brain cancer Maternal Grandmother     Social history:  reports that she has been smoking.  She has never used smokeless tobacco. She reports that she does not drink alcohol or use drugs.  Medications:  Prior to Admission medications   Medication Sig Start Date End Date Taking? Authorizing Provider  cyclobenzaprine (FLEXERIL) 10 MG tablet Take 1 tablet (10 mg total) by mouth every 8 (eight) hours as needed for muscle spasms. 06/13/16  Yes Rachelle A Denney, CNM  gabapentin (NEURONTIN) 600 MG tablet Take 2 tablets (1,200 mg total) by mouth 3 (three) times daily. 06/13/16  Yes Rachelle A Denney, CNM  meloxicam (MOBIC) 15 MG tablet take 1 tablet by mouth once daily with meals 12/26/16   Historical Provider, MD  neomycin-bacitracin-polymyxin (NEOSPORIN) [phone] ointment Apply topically 4 (four) times daily. Patient not taking: Reported on 06/13/2016 05/08/16   Roe Coombsachelle A Denney, CNM  omeprazole (PRILOSEC) 20 MG capsule take 1 capsule by mouth twice a day before MEAL 10/21/16   Roe Coombsachelle A Denney, CNM  Allergies  Allergen Reactions  . Codeine Nausea And Vomiting  . Hydrocodone Itching    itching  . Morphine And Related Nausea And Vomiting    ROS:  Out of a complete 14 system review of symptoms, the patient complains only of the following symptoms, and all other reviewed systems are negative.  Memory loss, headache, confusion  Blood pressure 118/82, pulse 90, height 5\' 7"  (1.702 m), weight 213 lb (96.6 kg), unknown if currently  breastfeeding.  Physical Exam  General: The patient is alert and cooperative at the time of the examination. The patient is moderately obese.  Eyes: Pupils are equal, round, and reactive to light. Discs are flat bilaterally.  Neck: The neck is supple, no carotid bruits are noted.  Respiratory: The respiratory examination is clear.  Cardiovascular: The cardiovascular examination reveals a regular rate and rhythm, no obvious murmurs or rubs are noted.  Neuromuscular: Range of movement of the cervical spine is relatively full.  Skin: Extremities are without significant edema.  Neurologic Exam  Mental status: The patient is alert and oriented x 3 at the time of the examination. The patient has apparent normal recent and remote memory, with an apparently normal attention span and concentration ability.  Cranial nerves: Facial symmetry is present. There is good sensation of the face to pinprick and soft touch bilaterally. The strength of the facial muscles and the muscles to head turning and shoulder shrug are normal bilaterally. Speech is well enunciated, no aphasia or dysarthria is noted. Extraocular movements are full. Visual fields are full. The tongue is midline, and the patient has symmetric elevation of the soft palate. No obvious hearing deficits are noted.  Motor: The motor testing reveals 5 over 5 strength of all 4 extremities. Good symmetric motor tone is noted throughout.  Sensory: Sensory testing is intact to pinprick, soft touch, vibration sensation, and position sense on all 4 extremities. No evidence of extinction is noted.  Coordination: Cerebellar testing reveals good finger-nose-finger and heel-to-shin bilaterally.  Gait and station: Gait is normal. Tandem gait is normal. Romberg is negative. No drift is seen.  Reflexes: Deep tendon reflexes are symmetric and normal bilaterally. Toes are downgoing bilaterally.   MRI brain 11/23/16:  IMPRESSION: 1. No acute  intracranial abnormality or mass. 2. Scattered small white matter lesions in both cerebral hemispheres and left cerebellum. These are nonspecific, however demyelinating disease should be considered. Other potential etiologies include prior infection/inflammation, vasculitis, chronic small vessel ischemia, and sequelae of migraines.  * MRI scan images were reviewed online. I agree with the written report.    Assessment/Plan:  1. Common migraine, daily headache, intractable   2. Neck discomfort   3. Abnormal MRI brain    The MRI abnormalities seen of the brain may be related to the history of migraine headache. The patient will undergo a workup to exclude other issues such as vasculitis or demyelinating disease. The patient will be sent for blood work today, she will have MRI evaluation of the cervical spine with and without gadolinium enhancement, and she will have a visual evoked response test. If the studies are unremarkable, we may consider lumbar puncture to fully exclude demyelinating disease. The patient will be placed on Topamax for the headache, she may take Advil or Aleve for the daily headaches. The patient will follow-up in 3 months.  Marlan Palau MD 01/02/2017 10:32 AM  [facility] [address] [place], [place] [address]  Phone [phone] Fax [phone]

## 2017-01-02 NOTE — Telephone Encounter (Signed)
When checking out, pt requested medication to take for anxiety/claustrophobia prior to MRI.

## 2017-01-02 NOTE — Patient Instructions (Signed)
   We will get blood work today and get MRI of the cervical spine and a visual evoked response test. We will start Topamax for the headache.  Topamax (topiramate) is a seizure medication that has an FDA approval for seizures and for migraine headache. Potential side effects of this medication include weight loss, cognitive slowing, tingling in the fingers and toes, and carbonated drinks will taste bad. If any significant side effects are noted on this drug, please contact our office.

## 2017-01-02 NOTE — Telephone Encounter (Signed)
I will call in a prescription for alprazolam for the MRI study.

## 2017-01-03 LAB — HIV ANTIBODY (ROUTINE TESTING W REFLEX): HIV SCREEN 4TH GENERATION: NONREACTIVE

## 2017-01-03 LAB — ANGIOTENSIN CONVERTING ENZYME: Angio Convert Enzyme: 44 U/L (ref 14–82)

## 2017-01-03 LAB — SEDIMENTATION RATE: SED RATE: 3 mm/h (ref 0–32)

## 2017-01-03 LAB — RHEUMATOID FACTOR

## 2017-01-03 LAB — PAN-ANCA
ANCA Proteinase 3: <3.5 U/mL (ref 0.0–3.5)
Atypical pANCA: <1:20 {titer}
C-ANCA: <1:20 {titer}
P-ANCA: <1:20 {titer}

## 2017-01-03 LAB — B. BURGDORFI ANTIBODIES

## 2017-01-03 LAB — ANA W/REFLEX: ANA: NEGATIVE

## 2017-01-06 ENCOUNTER — Telehealth: Payer: Self-pay | Admitting: *Deleted

## 2017-01-06 NOTE — Telephone Encounter (Signed)
Advised patient of unremarkable labs per previous message. °

## 2017-01-06 NOTE — Telephone Encounter (Signed)
-----   Message from York Spanielharles K Willis, MD sent at 01/03/2017  4:51 PM EST -----  The blood work results are unremarkable. Please call the patient.  ----- Message ----- From: Nell RangeInterface, [facility] Lab Results In Sent: 01/03/2017   7:42 AM To: York Spanielharles K Willis, MD

## 2017-01-06 NOTE — Telephone Encounter (Signed)
Called and LVM for pt to call about results.   *OK to inform pt labs unremarkable per CW, MD note.

## 2017-01-06 NOTE — Telephone Encounter (Signed)
Noted, thank you

## 2017-01-20 IMAGING — CR DG ELBOW COMPLETE 3+V*R*
4 series · 4 of 4 positions shown · non-contrast
Comparison: January 09, 2015

CLINICAL DATA: Pain following fall 1 month prior

EXAM:
RIGHT ELBOW - COMPLETE 3+ VIEW

[elbow ap (1 of 3)]
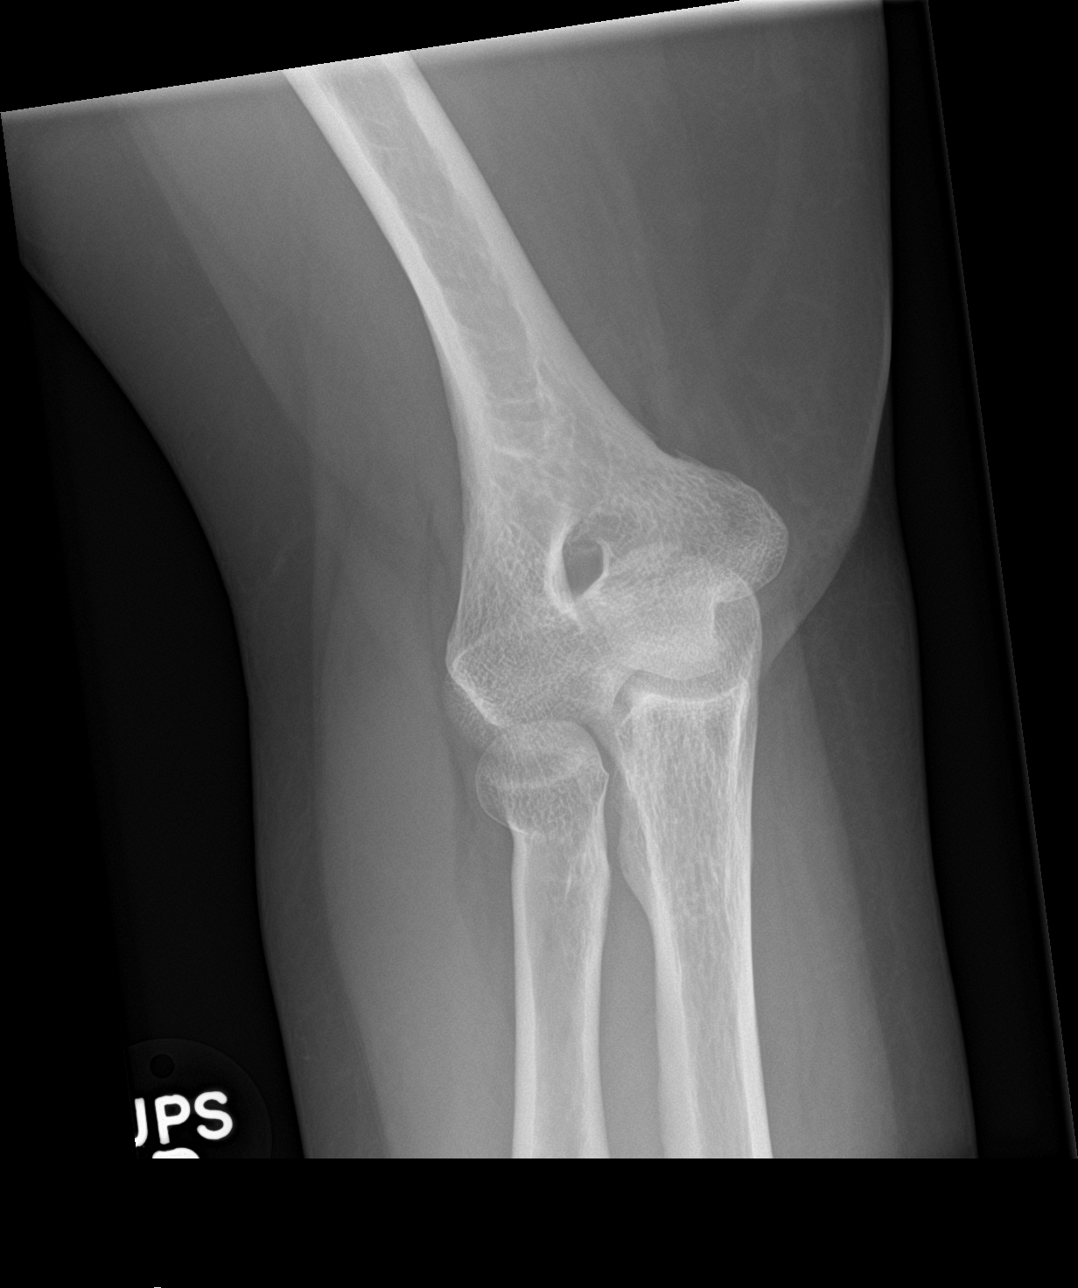

[elbow ap (2 of 3)]
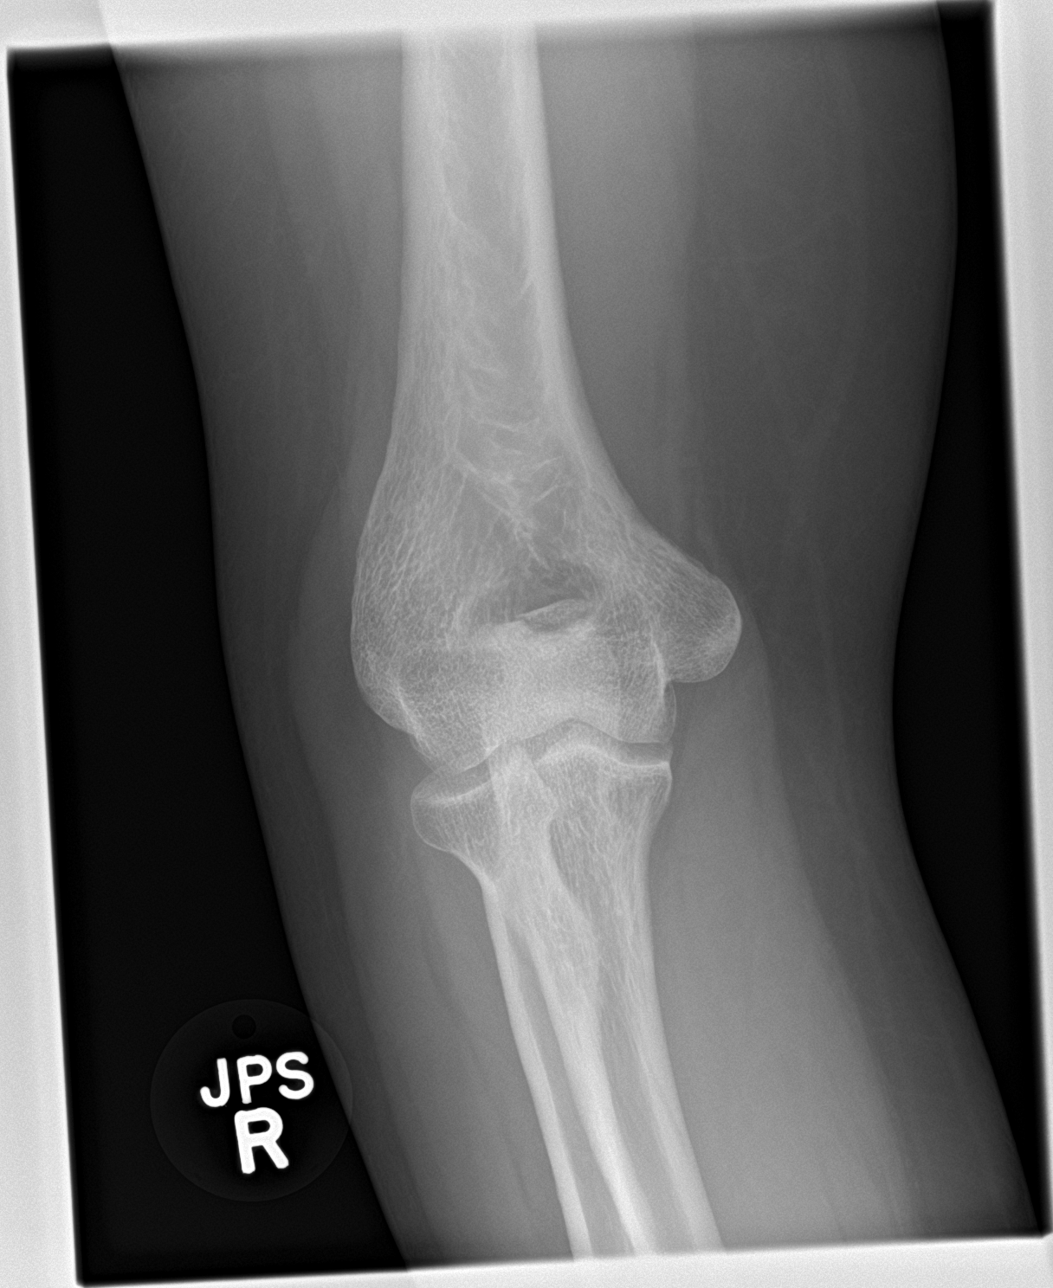

[elbow lat]
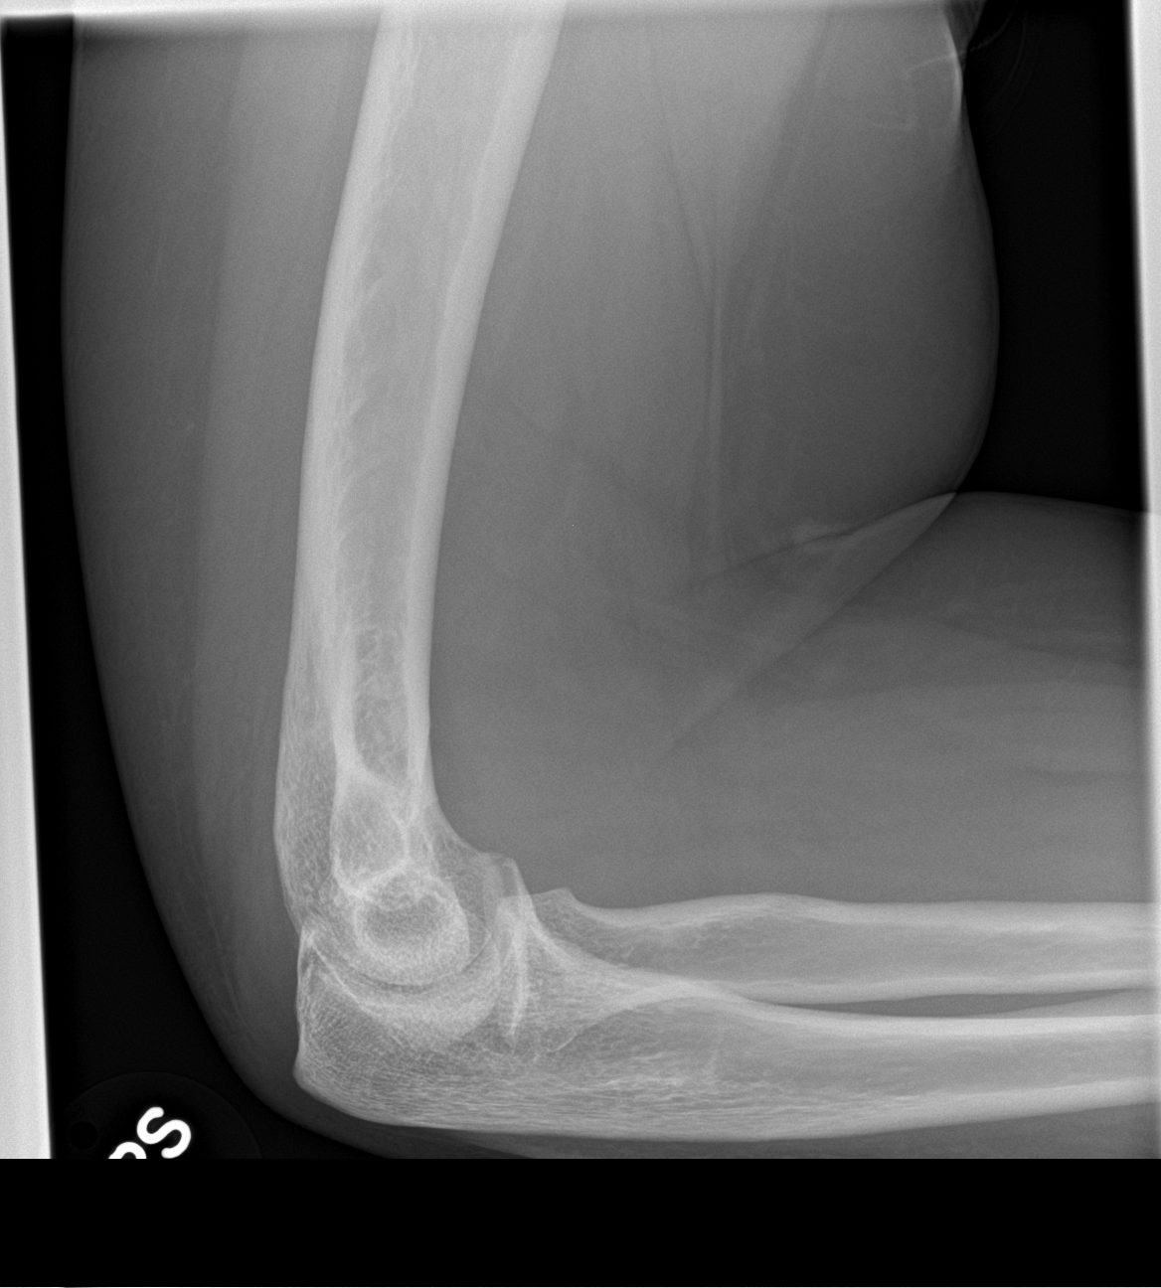

[elbow ap (3 of 3)]
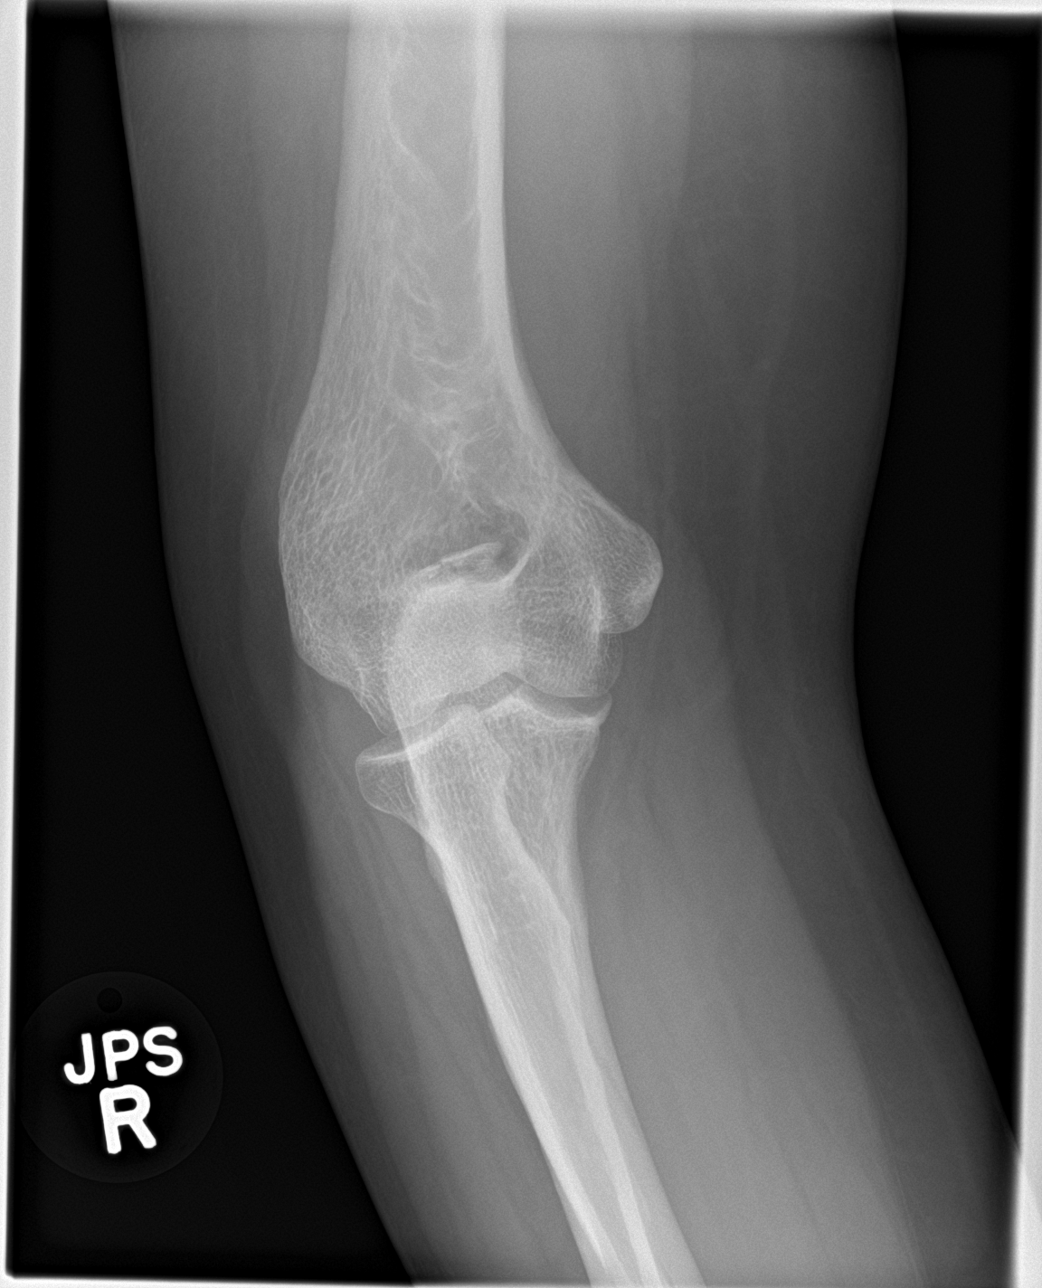

[4 of 4 positions shown; findings below may reference images not displayed]

FINDINGS: Frontal, lateral, and bilateral oblique views were obtained. There
is a fracture along the proximal most aspect of the ulna with slight
displacement of fracture fragments, best appreciable on the frontal
view. No other fracture. No dislocation. No appreciable joint
effusion. No joint space narrowing.
IMPRESSION: Fracture along the proximal most aspect of the ulna with slightly
displaced fracture fragment along the proximal aspect, best seen on
the frontal view. No dislocation. No apparent joint space narrowing.

## 2017-02-05 ENCOUNTER — Telehealth: Payer: Self-pay | Admitting: Neurology

## 2017-02-05 MED ORDER — PROPRANOLOL HCL 20 MG PO TABS: ORAL_TABLET | ORAL | 3 refills | Status: DC

## 2017-02-05 NOTE — Telephone Encounter (Signed)
Dr Anne HahnWillis- please advise. Pt last seen 01/02/17

## 2017-02-05 NOTE — Addendum Note (Signed)
Addended by: Stephanie AcreWILLIS, CHARLES on: 02/05/2017 01:20 PM   Modules accepted: Orders

## 2017-02-05 NOTE — Telephone Encounter (Signed)
I called patient. She is still having daily headaches, her severe headaches of become less frequent, but she continues to have a headache to some degree every day. She is on 75 mg at night of Topamax, she is having some cognitive side effects on this medication with some tingling in the hands.  She may not be a will to tolerate a higher dose of Topamax, she has Maxalt to take if needed for the headache, she has not yet used this medication.  We will start propranolol 20 mg twice daily for 2 weeks then go to 40 mg twice daily.

## 2017-02-05 NOTE — Telephone Encounter (Signed)
Patient is having headaches even though she is taking topiramate (TOPAMAX) 25 MG tablet. The headaches are not as bad but she is still having them.

## 2017-02-26 ENCOUNTER — Other Ambulatory Visit: Payer: Self-pay | Admitting: Neurology

## 2017-02-28 DIAGNOSIS — M1710 Unilateral primary osteoarthritis, unspecified knee: Secondary | ICD-10-CM | POA: Insufficient documentation

## 2017-02-28 DIAGNOSIS — M179 Osteoarthritis of knee, unspecified: Secondary | ICD-10-CM | POA: Insufficient documentation

## 2017-03-05 ENCOUNTER — Telehealth: Payer: Self-pay | Admitting: Neurology

## 2017-03-05 ENCOUNTER — Ambulatory Visit: Payer: Medicaid Other

## 2017-03-05 ENCOUNTER — Ambulatory Visit (INDEPENDENT_AMBULATORY_CARE_PROVIDER_SITE_OTHER): Payer: Medicaid Other | Admitting: Neurology

## 2017-03-05 DIAGNOSIS — M542 Cervicalgia: Secondary | ICD-10-CM | POA: Diagnosis not present

## 2017-03-05 DIAGNOSIS — R9089 Other abnormal findings on diagnostic imaging of central nervous system: Secondary | ICD-10-CM

## 2017-03-05 NOTE — Telephone Encounter (Signed)
I called the patient. The visual evoked response test was normal. MRI of the cervical spine is pending. The patient indicates that she is having a lot of problems with memory, this may need to be evaluated in the future.  If the cervical spine MRI is unremarkable, we may consider lumbar puncture.

## 2017-03-05 NOTE — Procedures (Signed)
    History:   Andrea [name] is a 36 year old patient with a history of migraine headaches. MRI the brain showing multiple white matter lesions, the patient is being evaluated for possible demyelinating disease.  Description: The visual evoked response test was performed today using 32 x 32 check sizes. The absolute latencies for the N1 and the P100 wave forms were within normal limits bilaterally. The amplitudes for the P100 wave forms were also within normal limits bilaterally. The visual acuity was 20/30 OD and 20/30 OS corrected.  Impression:  The visual evoked response test above was within normal limits bilaterally. No evidence of conduction slowing was seen within the anterior visual pathways on either side on today's evaluation.

## 2017-03-16 ENCOUNTER — Ambulatory Visit
Admission: RE | Admit: 2017-03-16 | Discharge: 2017-03-16 | Disposition: A | Discharge status: Home / Self Care | Payer: Medicaid Other | Source: Ambulatory Visit | Attending: Neurology | Admitting: Neurology

## 2017-03-16 DIAGNOSIS — R9089 Other abnormal findings on diagnostic imaging of central nervous system: Secondary | ICD-10-CM

## 2017-03-16 DIAGNOSIS — M542 Cervicalgia: Secondary | ICD-10-CM | POA: Diagnosis not present

## 2017-03-16 MED ORDER — GADOBENATE DIMEGLUMINE 529 MG/ML IV SOLN
20.0000 mL | Freq: Once | INTRAVENOUS | Status: AC | PRN
  Administered 2017-03-16: 20 mL via INTRAVENOUS

## 2017-03-17 ENCOUNTER — Telehealth: Payer: Self-pay | Admitting: Neurology

## 2017-03-17 NOTE — Telephone Encounter (Signed)
  I called the patient. The MRI of the cervical spine looks normal. VER was also normal. We will re-check MRI of the brain in 1 year for follow up. MRI changes may be related to migraine.

## 2017-03-17 NOTE — Telephone Encounter (Signed)
    MRI cervical 03/17/16:  IMPRESSION:  This is a normal MRI of the cervical spine with and without contrast.   Incidental note is made of a mildly prominent central canal from C5-C7 that is unlikely to be clinically significant.

## 2017-04-01 ENCOUNTER — Ambulatory Visit (INDEPENDENT_AMBULATORY_CARE_PROVIDER_SITE_OTHER): Payer: Medicaid Other | Admitting: Adult Health

## 2017-04-01 ENCOUNTER — Encounter (INDEPENDENT_AMBULATORY_CARE_PROVIDER_SITE_OTHER): Payer: Self-pay

## 2017-04-01 ENCOUNTER — Encounter: Payer: Self-pay | Admitting: Adult Health

## 2017-04-01 VITALS — BP 110/72 | HR 88 | Ht 67.0 in | Wt 192.8 lb

## 2017-04-01 DIAGNOSIS — R42 Dizziness and giddiness: Secondary | ICD-10-CM | POA: Diagnosis not present

## 2017-04-01 DIAGNOSIS — L659 Nonscarring hair loss, unspecified: Secondary | ICD-10-CM

## 2017-04-01 DIAGNOSIS — R5383 Other fatigue: Secondary | ICD-10-CM

## 2017-04-01 DIAGNOSIS — G43019 Migraine without aura, intractable, without status migrainosus: Secondary | ICD-10-CM | POA: Diagnosis not present

## 2017-04-01 MED ORDER — NORTRIPTYLINE HCL 10 MG PO CAPS: ORAL_CAPSULE | ORAL | 3 refills | Status: DC

## 2017-04-01 NOTE — Patient Instructions (Signed)
Decrease Topmax by 1 tablet each week until after the medication Start Nortriptyline:    Week 1: 1 tablet at bedtime Week 2: 2 tablets at bedtime Week 3: 3 tablets at bedtime  If your symptoms worsen or you develop new symptoms please let us know.   Nortriptyline capsules What is this medicine? NORTRIPTYLINE (nor TRIP ti leen) is used to treat depression. This medicine may be used for other purposes; ask your health care provider or pharmacist if you have questions. COMMON BRAND NAME(S): Aventyl, Pamelor What should I tell my health care provider before I take this medicine? They need to know if you have any of these conditions: -an alcohol problem -bipolar disorder or schizophrenia -difficulty passing urine, prostate trouble -glaucoma -heart disease or recent heart attack -liver disease -over active thyroid -seizures -thoughts or plans of suicide or a previous suicide attempt or family history of suicide attempt -an unusual or allergic reaction to nortriptyline, other medicines, foods, dyes, or preservatives -pregnant or trying to get pregnant -breast-feeding How should I use this medicine? Take this medicine by mouth with a glass of water. Follow the directions on the prescription label. Take your doses at regular intervals. Do not take it more often than directed. Do not stop taking this medicine suddenly except upon the advice of your doctor. Stopping this medicine too quickly may cause serious side effects or your condition may worsen. A special MedGuide will be given to you by the pharmacist with each prescription and refill. Be sure to read this information carefully each time. Talk to your pediatrician regarding the use of this medicine in children. Special care may be needed. Overdosage: If you think you have taken too much of this medicine contact a poison control center or emergency room at once. NOTE: This medicine is only for you. Do not share this medicine with  others. What if I miss a dose? If you miss a dose, take it as soon as you can. If it is almost time for your next dose, take only that dose. Do not take double or extra doses. What may interact with this medicine? Do not take this medicine with any of the following medications: -arsenic trioxide -certain medicines medicines for irregular heart beat -cisapride -halofantrine -linezolid -MAOIs like Carbex, Eldepryl, Marplan, Nardil, and Parnate -methylene blue (injected into a vein) -other medicines for mental depression -phenothiazines like perphenazine, thioridazine and chlorpromazine -pimozide -probucol -procarbazine -sparfloxacin -St. John's Wort -ziprasidone This medicine may also interact with any of the following medications: -atropine and related drugs like hyoscyamine, scopolamine, tolterodine and others -barbiturate medicines for inducing sleep or treating seizures, such as phenobarbital -cimetidine -medicines for diabetes -medicines for seizures like carbamazepine or phenytoin -reserpine -thyroid medicine This list may not describe all possible interactions. Give your health care provider a list of all the medicines, herbs, non-prescription drugs, or dietary supplements you use. Also tell them if you smoke, drink alcohol, or use illegal drugs. Some items may interact with your medicine. What should I watch for while using this medicine? Tell your doctor if your symptoms do not get better or if they get worse. Visit your doctor or health care professional for regular checks on your progress. Because it may take several weeks to see the full effects of this medicine, it is important to continue your treatment as prescribed by your doctor. Patients and their families should watch out for new or worsening thoughts of suicide or depression. Also watch out for sudden changes in feelings such  as feeling anxious, agitated, panicky, irritable, hostile, aggressive, impulsive, severely  restless, overly excited and hyperactive, or not being able to sleep. If this happens, especially at the beginning of treatment or after a change in dose, call your health care professional. Bonita Quin may get drowsy or dizzy. Do not drive, use machinery, or do anything that needs mental alertness until you know how this medicine affects you. Do not stand or sit up quickly, especially if you are an older patient. This reduces the risk of dizzy or fainting spells. Alcohol may interfere with the effect of this medicine. Avoid alcoholic drinks. Do not treat yourself for coughs, colds, or allergies without asking your doctor or health care professional for advice. Some ingredients can increase possible side effects. Your mouth may get dry. Chewing sugarless gum or sucking hard candy, and drinking plenty of water may help. Contact your doctor if the problem does not go away or is severe. This medicine may cause dry eyes and blurred vision. If you wear contact lenses you may feel some discomfort. Lubricating drops may help. See your eye doctor if the problem does not go away or is severe. This medicine can cause constipation. Try to have a bowel movement at least every 2 to 3 days. If you do not have a bowel movement for 3 days, call your doctor or health care professional. This medicine can make you more sensitive to the sun. Keep out of the sun. If you cannot avoid being in the sun, wear protective clothing and use sunscreen. Do not use sun lamps or tanning beds/booths. What side effects may I notice from receiving this medicine? Side effects that you should report to your doctor or health care professional as soon as possible: -allergic reactions like skin rash, itching or hives, swelling of the face, lips, or tongue -anxious -breathing problems -changes in vision -confusion -elevated mood, decreased need for sleep, racing thoughts, impulsive behavior -eye pain -fast, irregular heartbeat -feeling faint or  lightheaded, falls -feeling agitated, angry, or irritable -fever with increased sweating -hallucination, loss of contact with reality -seizures -stiff muscles -suicidal thoughts or other mood changes -tingling, pain, or numbness in the feet or hands -trouble passing urine or change in the amount of urine -trouble sleeping -unusually weak or tired -vomiting -yellowing of the eyes or skin Side effects that usually do not require medical attention (report to your doctor or health care professional if they continue or are bothersome): -change in sex drive or performance -change in appetite or weight -constipation -dizziness -dry mouth -nausea -tired -tremors -upset stomach This list may not describe all possible side effects. Call your doctor for medical advice about side effects. You may report side effects to FDA at 1-800-FDA-1088. Where should I keep my medicine? Keep out of the reach of children. Store at room temperature between 15 and 30 degrees C (59 and 86 degrees F). Keep container tightly closed. Throw away any unused medicine after the expiration date. NOTE: This sheet is a summary. It may not cover all possible information. If you have questions about this medicine, talk to your doctor, pharmacist, or health care provider.  2018 Elsevier/Gold Standard (2016-04-19 12:53:08)

## 2017-04-01 NOTE — Progress Notes (Signed)
PATIENT: Andrea [name] DOB: 02-Apr-1981  REASON FOR VISIT: follow up- headache HISTORY FROM: patient  HISTORY OF PRESENT ILLNESS: [name] is a 36 year old female with a history of migraine headaches. She is currently taking Topamax 75 mg at bedtime and propranolol 40 mg twice a day. She states that her migraines have improved. She has approximately 3 severe headaches a month. With these headaches she does have photophobia, nausea and vomiting. However she continues to have a daily headache. She describes the daily headache as a dull pain rated on a scale of 1-10 as a 3 or 4. She does not drink caffeine. Denies any trouble sleeping. She does report daytime fatigue. Denies snoring. She states that she has been on prednisone in the past and is unable to tolerate this. She has noticed some memory changes on Topamax she states that she gets confused when driving to familiar places and continuously has to write down things in order to remember. She is also noticed some hair loss since starting Topamax. She has had an MRI of the brain that showed nonspecific lesions. MRI of the cervical spine was unremarkable. VER was also normal. Patient also noted some dizziness that occurs randomly. She reports that she has been sent to ENT to rule out vertigo. She is unsure of the dizziness is related to her blood pressure. She returns today for an evaluation.  HISTORY 01/02/17 (WILLIS): [name] is a 36 year old right-handed white female with a history of migraine headaches since age 72. The patient has had headaches off and on throughout her life, usually the headaches are relatively infrequent, occurring 2 or 3 times a month. The patient began having onset of daily headaches about 4 months ago. The patient has delivered a child 8 months ago. The patient indicates that her headaches are throughout the head associated with a dull pain and throbbing sensation. The patient may have more severe headaches at times  associated with significant photophobia and phonophobia, along with nausea and vomiting. The patient does have some neck pain and neck stiffness with the headaches and some intermittent numbness of the hands. The patient has been told that she has carpal tunnel syndrome. The patient may have some blurring of vision with the headache, but no loss of vision. She denies any focal numbness or weakness with the migraine, she does have some difficulty with cognitive processing with the headache. Bright lights and odors are activating factors. The patient has a very strong family history of migraine seen in her mother, maternal grandmother, and one sister. The patient has been using some Maxalt for the headache with some benefit. She denies any problems with balance or difficulty controlling the bowels or the bladder. She does have dizziness and vertigo with the headache. She has had MRI evaluation of the brain done recently that shows multiple white matter abnormalities. The patient is referred to this office in part for this reason. The patient does have some neck discomfort and some low back pain as well.   REVIEW OF SYSTEMS: Out of a complete 14 system review of symptoms, the patient complains only of the following symptoms, and all other reviewed systems are negative.  Light sensitivity, double vision, blurred vision, fatigue, headache, memory loss  ALLERGIES: Allergies  Allergen Reactions  . Codeine Nausea And Vomiting  . Hydrocodone Itching    itching  . Morphine And Related Nausea And Vomiting    HOME MEDICATIONS: Outpatient Medications Prior to Visit  Medication Sig Dispense Refill  .  cyclobenzaprine (FLEXERIL) 10 MG tablet Take 1 tablet (10 mg total) by mouth every 8 (eight) hours as needed for muscle spasms. 90 tablet 6  . gabapentin (NEURONTIN) 600 MG tablet Take 2 tablets (1,200 mg total) by mouth 3 (three) times daily. 180 tablet 6  . meloxicam (MOBIC) 15 MG tablet take 1 tablet by mouth  once daily with meals  0  . propranolol (INDERAL) 20 MG tablet 1 tablet twice daily for 2 weeks, then take 2 tablets twice daily 120 tablet 3  . topiramate (TOPAMAX) 25 MG tablet Take one tablet at night for one week, then take 2 tablets at night for one week, then take 3 tablets at night. 90 tablet 3  . ALPRAZolam (XANAX) 0.5 MG tablet Take 2 tablets approximately 45 minutes prior to the MRI study, take a third tablet if needed. (Patient not taking: Reported on 04/01/2017) 3 tablet 0   No facility-administered medications prior to visit.     PAST MEDICAL HISTORY: Past Medical History:  Diagnosis Date  . Abnormal finding on MRI of brain 01/02/2017  . Anxiety and depression    see progress notes -sw  . Common migraine with intractable migraine 01/02/2017  . Polycystic ovarian disease   . Psoriasis   . Rosacea     PAST SURGICAL HISTORY: Past Surgical History:  Procedure Laterality Date  . CESAREAN SECTION N/A 04/24/2016   Procedure: CESAREAN SECTION;  Surgeon: Brock Bad, MD;  Location: [facility] BIRTHING SUITES;  Service: Obstetrics;  Laterality: N/A;  . CHOLECYSTECTOMY    . TONSILLECTOMY AND ADENOIDECTOMY      FAMILY HISTORY: Family History  Problem Relation Age of Onset  . Breast cancer Mother   . Bone cancer Mother   . Migraines Mother   . Colon cancer Father   . Prostate cancer Father   . Brain cancer Maternal Grandmother   . Migraines Maternal Grandmother   . Migraines Sister     SOCIAL HISTORY: Social History   Social History  . Marital status: Divorced    Spouse name: N/A  . Number of children: 2  . Years of education: GED   Occupational History  . N/A    Social History Main Topics  . Smoking status: Current Some Day Smoker  . Smokeless tobacco: Never Used  . Alcohol use No  . Drug use: No  . Sexual activity: Yes    Birth control/ protection: None   Other Topics Concern  . Not on file   Social History Narrative   Lives at home w/ her children    Right-handed   Caffeine: 1 cup coffee per day      PHYSICAL EXAM  Vitals:   04/01/17 0903  BP: 110/72  Pulse: 88  Weight: 192 lb 12.8 oz (87.5 kg)  Height:  (1.702 m)   Body mass index is 30.2 kg/m.  Generalized: Well developed, in no acute distress   Neurological examination  Mentation: Alert oriented to time, place, history taking. Follows all commands speech and language fluent Cranial nerve II-XII: Pupils were equal round reactive to light. Extraocular movements were full, visual field were full on confrontational test. Facial sensation and strength were normal. Uvula tongue midline. Head turning and shoulder shrug  were normal and symmetric. Motor: The motor testing reveals 5 over 5 strength of all 4 extremities. Good symmetric motor tone is noted throughout.  Sensory: Sensory testing is intact to soft touch on all 4 extremities. No evidence of extinction is noted.  Coordination: Cerebellar testing reveals good finger-nose-finger and heel-to-shin bilaterally.  Gait and station: Gait is normal. Tandem gait is Unsteady. Romberg is negative. No drift is seen.  Reflexes: Deep tendon reflexes are symmetric and normal bilaterally.   DIAGNOSTIC DATA (LABS, IMAGING, TESTING) - I reviewed patient records, labs, notes, testing and imaging myself where available.  Lab Results  Component Value Date   WBC 14.8 (H) 04/25/2016   HGB 10.3 (L) 04/25/2016   HCT 29.8 (L) 04/25/2016   MCV 87.9 04/25/2016   PLT 210 04/25/2016      Component Value Date/Time   NA 139 10/04/2015 0909   K 3.8 10/04/2015 0909   CL 104 10/04/2015 0909   CO2 25 10/04/2015 0909   GLUCOSE 91 10/04/2015 0909   BUN 15 10/04/2015 0909   CREATININE 0.67 10/04/2015 0909   CALCIUM 9.6 10/04/2015 0909   PROT 7.3 10/04/2015 0909   ALBUMIN 4.5 10/04/2015 0909   AST 19 10/04/2015 0909   ALT 16 10/04/2015 0909   ALKPHOS 63 10/04/2015 0909   BILITOT 0.7 10/04/2015 0909   GFRNONAA >60 10/04/2015 0909   GFRAA  >60 10/04/2015 0909       ASSESSMENT AND PLAN 36 y.o. year old female  has a past medical history of Abnormal finding on MRI of brain (01/02/2017); Anxiety and depression; Common migraine with intractable migraine (01/02/2017); Polycystic ovarian disease; Psoriasis; and Rosacea. here with:  1. Migraine headaches 2. Hair loss 3. Fatigue 4. Dizziness  The patient has been unable to tolerate Topamax. We will slowly wean the patient off the Topamax. She will begin by decreasing by 1 tablet each week until she is off medication. We will start her on nortriptyline taking 10 mg at bedtime for the first week, 20 mg second week and 30 mg thereafter. I have reviewed side effects of nortriptyline with the patient. She verbalized understanding. She is also having hair loss and fatigue. She feels that this started after her Topamax. I will also check thyroid levels today. Unsure at this time what her dizziness is related to. I have advised patient that she should check her blood pressure before lunch everyday. If she finds that her blood pressure or heart rate is running low she should let us know. She will follow-up in 2 months or sooner if needed.    Butch Penny, MSN, NP-C 04/01/2017, 10:04 AM [facility] [address] [place], [place] [address] [phone]

## 2017-04-01 NOTE — Progress Notes (Signed)
I have read the note, and I agree with the clinical assessment and plan.  Andrea [name]   

## 2017-04-02 ENCOUNTER — Telehealth: Payer: Self-pay | Admitting: *Deleted

## 2017-04-02 LAB — TSH: TSH: 0.666 u[IU]/mL (ref 0.450–4.500)

## 2017-04-02 NOTE — Telephone Encounter (Signed)
Called and spoke with patient about unremarkable labs per MM,NP note. Patient verbalized understanding.

## 2017-04-02 NOTE — Telephone Encounter (Signed)
-----   Message from Butch Penny, NP sent at 04/02/2017  9:14 AM EDT ----- Blood work was unremarkable. Please call patient with results

## 2017-04-03 ENCOUNTER — Emergency Department (HOSPITAL_COMMUNITY)
Admission: EM | Admit: 2017-04-03 | Discharge: 2017-04-03 | Disposition: A | Discharge status: Home / Self Care | Payer: Medicaid Other | Attending: Emergency Medicine | Admitting: Emergency Medicine

## 2017-04-03 ENCOUNTER — Encounter (HOSPITAL_COMMUNITY): Payer: Self-pay | Admitting: *Deleted

## 2017-04-03 DIAGNOSIS — N762 Acute vulvitis: Secondary | ICD-10-CM | POA: Diagnosis not present

## 2017-04-03 DIAGNOSIS — R102 Pelvic and perineal pain: Secondary | ICD-10-CM | POA: Diagnosis present

## 2017-04-03 DIAGNOSIS — F17200 Nicotine dependence, unspecified, uncomplicated: Secondary | ICD-10-CM | POA: Diagnosis not present

## 2017-04-03 DIAGNOSIS — Z7982 Long term (current) use of aspirin: Secondary | ICD-10-CM | POA: Diagnosis not present

## 2017-04-03 MED ORDER — DOXYCYCLINE HYCLATE 100 MG PO TABS
100.0000 mg | ORAL_TABLET | Freq: Once | ORAL | Status: AC
  Administered 2017-04-03: 100 mg via ORAL
  Filled 2017-04-03: qty 1

## 2017-04-03 MED ORDER — LIDOCAINE HCL (PF) 1 % IJ SOLN
INTRAMUSCULAR | Status: AC
  Administered 2017-04-03: 2.1 mL
  Filled 2017-04-03: qty 5

## 2017-04-03 MED ORDER — OXYCODONE-ACETAMINOPHEN 5-325 MG PO TABS: 2.0000 | ORAL_TABLET | ORAL | 0 refills | Status: DC | PRN

## 2017-04-03 MED ORDER — CEFTRIAXONE SODIUM 1 G IJ SOLR
1.0000 g | Freq: Once | INTRAMUSCULAR | Status: AC
  Administered 2017-04-03: 1 g via INTRAMUSCULAR
  Filled 2017-04-03: qty 10

## 2017-04-03 MED ORDER — DOXYCYCLINE HYCLATE 100 MG PO CAPS: 100.0000 mg | ORAL_CAPSULE | Freq: Two times a day (BID) | ORAL | 0 refills | Status: DC

## 2017-04-03 MED ORDER — OXYCODONE-ACETAMINOPHEN 5-325 MG PO TABS
2.0000 | ORAL_TABLET | Freq: Once | ORAL | Status: AC
  Administered 2017-04-03: 2 via ORAL
  Filled 2017-04-03: qty 2

## 2017-04-03 NOTE — Discharge Instructions (Signed)
Return here tomorrow for recheck.   [facility]

## 2017-04-03 NOTE — ED Provider Notes (Signed)
[facility]-EMERGENCY DEPT Provider Note   CSN: [id number] Arrival date & time: 04/03/17  1723     History   Chief Complaint Chief Complaint  Patient presents with  . Abscess    HPI Andrea [name] is a 36 y.o. female.  The history is provided by the patient. No language interpreter was used.  Abscess  Location:  Pelvis Size:  3 Abscess quality: painful, redness and warmth   Abscess quality: not draining   Red streaking: no   Progression:  Worsening Pain details:    Quality:  Aching   Severity:  Moderate   Timing:  Constant   Progression:  Worsening Chronicity:  New Relieved by:  Nothing Worsened by:  Nothing Ineffective treatments:  None tried Associated symptoms: no nausea     Past Medical History:  Diagnosis Date  . Abnormal finding on MRI of brain 01/02/2017  . Anxiety and depression    see progress notes -sw  . Common migraine with intractable migraine 01/02/2017  . Polycystic ovarian disease   . Psoriasis   . Rosacea     Patient Active Problem List   Diagnosis Date Noted  . Common migraine with intractable migraine 01/02/2017  . Abnormal finding on MRI of brain 01/02/2017  . Postoperative wound cellulitis 05/08/2016  . S/P cesarean section 04/24/2016  . Status post primary low transverse cesarean section 04/24/2016  . Oligohydramnios 04/19/2016  . Supervision of pregnancy with history of pre-term labor in first trimester 10/31/2015    Past Surgical History:  Procedure Laterality Date  . CESAREAN SECTION N/A 04/24/2016   Procedure: CESAREAN SECTION;  Surgeon: Brock Bad, MD;  Location: [facility] BIRTHING SUITES;  Service: Obstetrics;  Laterality: N/A;  . CHOLECYSTECTOMY    . TONSILLECTOMY AND ADENOIDECTOMY      OB History    Gravida Para Term Preterm AB Living   2 2 0 2 0 2   SAB TAB Ectopic Multiple Live Births   0 0 0 0 2       Home Medications    Prior to Admission medications   Medication Sig Start Date End Date Taking? Authorizing Provider    aspirin-acetaminophen-caffeine (EXCEDRIN MIGRAINE) [phone] MG tablet Take 2 tablets by mouth every 6 (six) hours as needed for headache or migraine.   Yes Historical Provider, MD  cyclobenzaprine (FLEXERIL) 10 MG tablet Take 1 tablet (10 mg total) by mouth every 8 (eight) hours as needed for muscle spasms. Patient taking differently: Take 30 mg by mouth at bedtime.  06/13/16  Yes Rachelle A Denney, CNM  gabapentin (NEURONTIN) 600 MG tablet Take 2 tablets (1,200 mg total) by mouth 3 (three) times daily. Patient taking differently: Take 3,600 mg by mouth at bedtime.  06/13/16  Yes Rachelle A Denney, CNM  meloxicam (MOBIC) 15 MG tablet take 1 tablet by mouth once daily with meals 12/26/16  Yes Historical Provider, MD  nortriptyline (PAMELOR) 10 MG capsule Take 1 tablet  PO  at bedtime for 1 week then increase to 2 tablets for 1 week, then 3 tablets at bedtime thereafter Patient taking differently: Take 10-30 mg by mouth at bedtime. Take 1 tablet  PO  at bedtime for 1 week then increase to 2 tablets for 1 week, then 3 tablets at bedtime thereafter 04/01/17  Yes Butch Penny, NP  rizatriptan (MAXALT) 10 MG tablet Take 10 mg by mouth as needed for migraine. May repeat in 2 hours if needed   Yes Historical Provider, MD  topiramate (TOPAMAX) 25 MG tablet  Take one tablet at night for one week, then take 2 tablets at night for one week, then take 3 tablets at night. Patient taking differently: Take 50 mg by mouth at bedtime.  01/02/17  Yes York Spanielharles K Willis, MD  doxycycline (VIBRAMYCIN) 100 MG capsule Take 1 capsule (100 mg total) by mouth 2 (two) times daily. 04/03/17   Elson AreasLeslie K Sofia, PA-C  oxyCODONE-acetaminophen (PERCOCET/ROXICET) 5-325 MG tablet Take 2 tablets by mouth every 4 (four) hours as needed for severe pain. 04/03/17   Elson AreasLeslie K Sofia, PA-C  propranolol (INDERAL) 20 MG tablet 1 tablet twice daily for 2 weeks, then take 2 tablets twice daily Patient not taking: Reported on 04/03/2017 02/05/17   York Spanielharles K  Willis, MD    Family History Family History  Problem Relation Age of Onset  . Breast cancer Mother   . Bone cancer Mother   . Migraines Mother   . Colon cancer Father   . Prostate cancer Father   . Brain cancer Maternal Grandmother   . Migraines Maternal Grandmother   . Migraines Sister     Social History Social History  Substance Use Topics  . Smoking status: Current Some Day Smoker  . Smokeless tobacco: Never Used  . Alcohol use No     Allergies   Codeine; Hydrocodone; and Morphine and related   Review of Systems Review of Systems  Gastrointestinal: Negative for nausea.  All other systems reviewed and are negative.    Physical Exam Updated Vital Signs BP 126/75 (BP Location: Right Arm)   Pulse 95   Temp 98.1 F (36.7 C) (Oral)   Resp 18   Ht 5\' 7"  (1.702 m)   Wt 86.2 kg   LMP 03/20/2017   SpO2 100%   BMI 29.76 kg/m   Physical Exam  Constitutional: She appears well-developed and well-nourished.  HENT:  Head: Normocephalic.  Cardiovascular: Normal rate.   Pulmonary/Chest: Effort normal.  Abdominal: Soft.  Genitourinary:  Genitourinary Comments: 3cm swollen area upper right labia to clitoris,  Abrasion center,   Musculoskeletal: Normal range of motion.  Neurological: She is alert.  Nursing note and vitals reviewed.    ED Treatments / Results  Labs (all labs ordered are listed, but only abnormal results are displayed) Labs Reviewed - No data to display  EKG  EKG Interpretation None       Radiology No results found.  Procedures Procedures (including critical care time)  Medications Ordered in ED Medications  cefTRIAXone (ROCEPHIN) injection 1 g (1 g Intramuscular Given 04/03/17 1911)  doxycycline (VIBRA-TABS) tablet 100 mg (100 mg Oral Given 04/03/17 1911)  lidocaine (PF) (XYLOCAINE) 1 % injection (2.1 mLs  Given 04/03/17 1911)  oxyCODONE-acetaminophen (PERCOCET/ROXICET) 5-325 MG per tablet 2 tablet (2 tablets Oral Given 04/03/17 1933)      Initial Impression / Assessment and Plan / ED Course  I have reviewed the triage vital signs and the nursing notes.  Pertinent labs & imaging results that were available during my care of the patient were reviewed by me and considered in my medical decision making (see chart for details).     Dr. zammit in to see.  Pt advised to return here tomorrow for recheck.    Final Clinical Impressions(s) / ED Diagnoses   Final diagnoses:  Cellulitis of labia    New Prescriptions Discharge Medication List as of 04/03/2017  7:25 PM    START taking these medications   Details  doxycycline (VIBRAMYCIN) 100 MG capsule Take 1 capsule (100  mg total) by mouth 2 (two) times daily., Starting Thu 04/03/2017, Print    oxyCODONE-acetaminophen (PERCOCET/ROXICET) 5-325 MG tablet Take 2 tablets by mouth every 4 (four) hours as needed for severe pain., Starting Thu 04/03/2017, Print      An After Visit Summary was printed and given to the patient.   Lonia Skinner [place], PA-C 04/03/17 2358    Bethann Berkshire, MD 04/04/17 [phone]

## 2017-04-03 NOTE — ED Triage Notes (Signed)
Pt comes in with an abscess on her labia. Pt states this started 3-4 days ago. Denies any drainage. States she has tried to stick a needle in it to get it to drain.

## 2017-04-04 ENCOUNTER — Emergency Department (HOSPITAL_COMMUNITY)
Admission: EM | Admit: 2017-04-04 | Discharge: 2017-04-04 | Disposition: A | Discharge status: Home / Self Care | Payer: Medicaid Other | Attending: Emergency Medicine | Admitting: Emergency Medicine

## 2017-04-04 ENCOUNTER — Encounter (HOSPITAL_COMMUNITY): Payer: Self-pay | Admitting: *Deleted

## 2017-04-04 DIAGNOSIS — Z7982 Long term (current) use of aspirin: Secondary | ICD-10-CM | POA: Insufficient documentation

## 2017-04-04 DIAGNOSIS — Z4801 Encounter for change or removal of surgical wound dressing: Secondary | ICD-10-CM | POA: Diagnosis present

## 2017-04-04 DIAGNOSIS — F17200 Nicotine dependence, unspecified, uncomplicated: Secondary | ICD-10-CM | POA: Diagnosis not present

## 2017-04-04 DIAGNOSIS — N764 Abscess of vulva: Secondary | ICD-10-CM | POA: Insufficient documentation

## 2017-04-04 DIAGNOSIS — Z5189 Encounter for other specified aftercare: Secondary | ICD-10-CM

## 2017-04-04 NOTE — ED Triage Notes (Signed)
Pt was here yesterday for a abscess on her labia. Pt was discharged with antibiotics and told to come back here today for a re-check [facility]. Pt states she took a bath this morning and had drainage after that.

## 2017-04-04 NOTE — ED Provider Notes (Signed)
[facility]-EMERGENCY DEPT Provider Note   CSN: [id number] Arrival date & time: 04/04/17  1636     History   Chief Complaint Chief Complaint  Patient presents with  . Abscess    HPI Andrea [name] is a 36 y.o. female who presents to the ED for wound check. Patient was evaluated 24 hours earlier for labial abscess and started on antibiotics and encouraged to do sitz baths. She was to return today for recheck and I&D if needed. Patient has been doing as instructed and reports that today after being in warm water that the area opened and a large amount of bloody infected drainage came out. The area has decreased in size and the pain contnues but is less.   HPI  Past Medical History:  Diagnosis Date  . Abnormal finding on MRI of brain 01/02/2017  . Anxiety and depression    see progress notes -sw  . Common migraine with intractable migraine 01/02/2017  . Polycystic ovarian disease   . Psoriasis   . Rosacea     Patient Active Problem List   Diagnosis Date Noted  . Common migraine with intractable migraine 01/02/2017  . Abnormal finding on MRI of brain 01/02/2017  . Postoperative wound cellulitis 05/08/2016  . S/P cesarean section 04/24/2016  . Status post primary low transverse cesarean section 04/24/2016  . Oligohydramnios 04/19/2016  . Supervision of pregnancy with history of pre-term labor in first trimester 10/31/2015    Past Surgical History:  Procedure Laterality Date  . CESAREAN SECTION N/A 04/24/2016   Procedure: CESAREAN SECTION;  Surgeon: Brock Bad, MD;  Location: [facility] BIRTHING SUITES;  Service: Obstetrics;  Laterality: N/A;  . CHOLECYSTECTOMY    . TONSILLECTOMY AND ADENOIDECTOMY      OB History    Gravida Para Term Preterm AB Living   2 2 0 2 0 2   SAB TAB Ectopic Multiple Live Births   0 0 0 0 2       Home Medications    Prior to Admission medications   Medication Sig Start Date End Date Taking? Authorizing Provider  aspirin-acetaminophen-caffeine  (EXCEDRIN MIGRAINE) [phone] MG tablet Take 2 tablets by mouth every 6 (six) hours as needed for headache or migraine.    [provider]  cyclobenzaprine (FLEXERIL) 10 MG tablet Take 1 tablet (10 mg total) by mouth every 8 (eight) hours as needed for muscle spasms. Patient taking differently: Take 30 mg by mouth at bedtime.  06/13/16   Orvilla Cornwall A, CNM  doxycycline (VIBRAMYCIN) 100 MG capsule Take 1 capsule (100 mg total) by mouth 2 (two) times daily. 04/03/17   Elson Areas, PA-C  gabapentin (NEURONTIN) 600 MG tablet Take 2 tablets (1,200 mg total) by mouth 3 (three) times daily. Patient taking differently: Take 3,600 mg by mouth at bedtime.  06/13/16   Roe Coombs, CNM  meloxicam (MOBIC) 15 MG tablet take 1 tablet by mouth once daily with meals 12/26/16   [provider]  nortriptyline (PAMELOR) 10 MG capsule Take 1 tablet  PO  at bedtime for 1 week then increase to 2 tablets for 1 week, then 3 tablets at bedtime thereafter Patient taking differently: Take 10-30 mg by mouth at bedtime. Take 1 tablet  PO  at bedtime for 1 week then increase to 2 tablets for 1 week, then 3 tablets at bedtime thereafter 04/01/17   Butch Penny, NP  oxyCODONE-acetaminophen (PERCOCET/ROXICET) 5-325 MG tablet Take 2 tablets by mouth every 4 (four) hours as needed  for severe pain. 04/03/17   Elson AreasSofia, Leslie K, PA-C  propranolol (INDERAL) 20 MG tablet 1 tablet twice daily for 2 weeks, then take 2 tablets twice daily Patient not taking: Reported on 04/03/2017 02/05/17   York SpanielWillis, Charles K, MD  rizatriptan (MAXALT) 10 MG tablet Take 10 mg by mouth as needed for migraine. May repeat in 2 hours if needed    [provider]  topiramate (TOPAMAX) 25 MG tablet Take one tablet at night for one week, then take 2 tablets at night for one week, then take 3 tablets at night. Patient taking differently: Take 50 mg by mouth at bedtime.  01/02/17   York SpanielWillis, Charles K, MD    Family History Family History    Problem Relation Age of Onset  . Breast cancer Mother   . Bone cancer Mother   . Migraines Mother   . Colon cancer Father   . Prostate cancer Father   . Brain cancer Maternal Grandmother   . Migraines Maternal Grandmother   . Migraines Sister     Social History Social History  Substance Use Topics  . Smoking status: Current Some Day Smoker  . Smokeless tobacco: Never Used  . Alcohol use No     Allergies   Codeine; Hydrocodone; and Morphine and related   Review of Systems Review of Systems  Constitutional: Negative for chills and fever.  Gastrointestinal: Negative for nausea and vomiting.  Musculoskeletal: Positive for arthralgias.  Skin: Positive for wound.     Physical Exam Updated Vital Signs BP 136/79 (BP Location: Right Arm)   Pulse 95   Temp 97.9 F (36.6 C) (Oral)   Resp 18   Ht 5\' 7"  (1.702 m)   Wt 86.2 kg   LMP 03/20/2017   SpO2 97%   BMI 29.76 kg/m   Physical Exam  Constitutional: She is oriented to person, place, and time. She appears well-developed and well-nourished. No distress.  Eyes: EOM are normal.  Neck: Neck supple.  Cardiovascular: Normal rate.   Pulmonary/Chest: Effort normal.  Abdominal: Soft. There is no tenderness.  Genitourinary:  Genitourinary Comments: Abscess to right labia open and draining.  Musculoskeletal: Normal range of motion.  Neurological: She is alert and oriented to person, place, and time. No cranial nerve deficit.  Skin: Skin is warm and dry.  Psychiatric: She has a normal mood and affect.  Nursing note and vitals reviewed.    ED Treatments / Results  Labs (all labs ordered are listed, but only abnormal results are displayed) Labs Reviewed - No data to display   Radiology No results found.  Procedures Procedures (including critical care time)  Medications Ordered in ED Medications - No data to display   Initial Impression / Assessment and Plan / ED Course  I have reviewed the triage vital signs  and the nursing notes.  Final Clinical Impressions(s) / ED Diagnoses  36 y.o. female here for wound check stable for d/c without fever and area now draining. She will continue the antibiotics and warm soaks and f/u with her PCP or return for worsening symptoms.  Final diagnoses:  Wound check, abscess    New Prescriptions Discharge Medication List as of 04/04/2017  6:15 PM       Kerrie Buffaloeese, Shonette Rhames OdenM, NP 04/06/17 [phone]    Bethann BerkshireZammit, Joseph, MD 04/07/17 1006

## 2017-04-04 NOTE — Discharge Instructions (Signed)
Take Aleve morning and bedtime. Apply warm wet compresses to the area as often as possible. Continue the antibiotics. Follow up with your doctor next week as scheduled. Return here as needed.

## 2017-04-29 ENCOUNTER — Other Ambulatory Visit: Payer: Self-pay | Admitting: Nurse Practitioner

## 2017-04-29 ENCOUNTER — Ambulatory Visit
Admission: RE | Admit: 2017-04-29 | Discharge: 2017-04-29 | Disposition: A | Discharge status: Home / Self Care | Payer: Medicaid Other | Source: Ambulatory Visit | Attending: Nurse Practitioner | Admitting: Nurse Practitioner

## 2017-04-29 DIAGNOSIS — M25511 Pain in right shoulder: Secondary | ICD-10-CM | POA: Insufficient documentation

## 2017-06-17 ENCOUNTER — Ambulatory Visit: Payer: Medicaid Other | Admitting: Adult Health

## 2017-06-18 ENCOUNTER — Encounter: Payer: Self-pay | Admitting: Adult Health

## 2017-07-31 ENCOUNTER — Other Ambulatory Visit: Payer: Self-pay | Admitting: Certified Nurse Midwife

## 2018-01-16 ENCOUNTER — Other Ambulatory Visit: Payer: Self-pay

## 2018-01-16 MED ORDER — FLUCONAZOLE 150 MG PO TABS: 150.0000 mg | ORAL_TABLET | Freq: Every day | ORAL | 0 refills | Status: DC

## 2018-01-19 ENCOUNTER — Other Ambulatory Visit: Payer: Self-pay

## 2018-04-16 ENCOUNTER — Other Ambulatory Visit: Payer: Self-pay | Admitting: Nurse Practitioner

## 2018-04-16 MED ORDER — RIZATRIPTAN BENZOATE 10 MG PO TABS: 10.0000 mg | ORAL_TABLET | ORAL | 0 refills | Status: DC | PRN

## 2018-05-15 ENCOUNTER — Ambulatory Visit: Payer: Medicaid Other | Admitting: Nurse Practitioner

## 2018-05-15 ENCOUNTER — Encounter: Payer: Self-pay | Admitting: Nurse Practitioner

## 2018-05-15 VITALS — BP 123/82 | HR 103 | Temp 97.2°F | Resp 16 | Ht 67.0 in | Wt 238.0 lb

## 2018-05-15 DIAGNOSIS — N390 Urinary tract infection, site not specified: Secondary | ICD-10-CM

## 2018-05-15 DIAGNOSIS — R9089 Other abnormal findings on diagnostic imaging of central nervous system: Secondary | ICD-10-CM

## 2018-05-15 DIAGNOSIS — B3731 Acute candidiasis of vulva and vagina: Secondary | ICD-10-CM

## 2018-05-15 DIAGNOSIS — G43019 Migraine without aura, intractable, without status migrainosus: Secondary | ICD-10-CM

## 2018-05-15 DIAGNOSIS — Z803 Family history of malignant neoplasm of breast: Secondary | ICD-10-CM

## 2018-05-15 DIAGNOSIS — R319 Hematuria, unspecified: Secondary | ICD-10-CM

## 2018-05-15 DIAGNOSIS — F411 Generalized anxiety disorder: Secondary | ICD-10-CM

## 2018-05-15 DIAGNOSIS — R9082 White matter disease, unspecified: Secondary | ICD-10-CM | POA: Diagnosis not present

## 2018-05-15 DIAGNOSIS — B373 Candidiasis of vulva and vagina: Secondary | ICD-10-CM

## 2018-05-15 DIAGNOSIS — R300 Dysuria: Secondary | ICD-10-CM | POA: Diagnosis not present

## 2018-05-15 DIAGNOSIS — R600 Localized edema: Secondary | ICD-10-CM

## 2018-05-15 DIAGNOSIS — R29818 Other symptoms and signs involving the nervous system: Secondary | ICD-10-CM | POA: Diagnosis not present

## 2018-05-15 DIAGNOSIS — J069 Acute upper respiratory infection, unspecified: Secondary | ICD-10-CM

## 2018-05-15 DIAGNOSIS — Z1231 Encounter for screening mammogram for malignant neoplasm of breast: Secondary | ICD-10-CM | POA: Diagnosis not present

## 2018-05-15 DIAGNOSIS — Z1239 Encounter for other screening for malignant neoplasm of breast: Secondary | ICD-10-CM

## 2018-05-15 LAB — POCT URINALYSIS DIPSTICK
Bilirubin, UA: NEGATIVE
Glucose, UA: NEGATIVE
Ketones, UA: NEGATIVE
LEUKOCYTES UA: NEGATIVE
Nitrite, UA: NEGATIVE
PH UA: 5 (ref 5.0–8.0)
Protein, UA: NEGATIVE
Spec Grav, UA: 1.01 (ref 1.010–1.025)
UROBILINOGEN UA: 0.2 U/dL

## 2018-05-15 MED ORDER — BUSPIRONE HCL 10 MG PO TABS: ORAL_TABLET | ORAL | 3 refills | Status: DC

## 2018-05-15 MED ORDER — RIZATRIPTAN BENZOATE 10 MG PO TBDP: 10.0000 mg | ORAL_TABLET | ORAL | 2 refills | Status: DC | PRN

## 2018-05-15 MED ORDER — HYDROCHLOROTHIAZIDE 25 MG PO TABS: ORAL_TABLET | ORAL | 3 refills | Status: DC

## 2018-05-15 MED ORDER — FLUCONAZOLE 150 MG PO TABS: 150.0000 mg | ORAL_TABLET | Freq: Every day | ORAL | 1 refills | Status: DC

## 2018-05-15 MED ORDER — SULFAMETHOXAZOLE-TRIMETHOPRIM 800-160 MG PO TABS: 1.0000 | ORAL_TABLET | Freq: Two times a day (BID) | ORAL | 0 refills | Status: DC

## 2018-05-15 NOTE — Progress Notes (Signed)
[facility] [address] [place], [place] [address]  Internal MEDICINE  Office Visit Note  Patient Name: Andrea [name]  [id number]  [id number]  Date of Service: 05/20/2018    Pt is here for routine follow up.   Chief Complaint  Patient presents with  . Cough  . Vaginitis  . Migraine  . Leg Swelling    The patient presents with multiple complaints today. She is having cough, nasal congestion, and sinus pressure. Has been going on for several days, close to a week. Bother of her children have been sick recently and she feels like she has caught something from them.  She is also having pain and frequency of urination. Burning every time she urinates. Has not noted blood in the urine or strong odor. Has seen some white colored vaginal discharge. She does have some vaginal itching and irritation.  She notes some fluid retention in both lower extremities. They feel tight and they ache. Blood pressure is well controlled. Drinking fluid and avoiding salt.  Finally, [facility]. She is to be monitored every year and they would like for her to have a new MRI prior to her next visit to evaluate extent of white matter lesions in brain. She continues to have headaches/migraines nearly every day. Gait is "clumsy." continues to be forgetful and has trouble with speech sometimes.      Current Medication: Outpatient Encounter Medications as of 05/15/2018  Medication Sig  . aspirin-acetaminophen-caffeine (EXCEDRIN MIGRAINE) 250-250-65 MG tablet Take 2 tablets by mouth every 6 (six) hours as needed for headache or migraine.  . busPIRone (BUSPAR) 10 MG tablet Take 1/2 to 1 tablet po BID prn anxiety  . cyclobenzaprine (FLEXERIL) 10 MG tablet Take 1 tablet (10 mg total) by mouth every 8 (eight) hours as needed for muscle spasms. (Patient taking differently: Take 30 mg by mouth at bedtime. )  . fluconazole (DIFLUCAN) 150 MG  tablet Take 1 tablet (150 mg total) by mouth daily. Take 1 tablet and repeat in 3 days.  Marland Kitchen. gabapentin (NEURONTIN) 600 MG tablet Take 2 tablets (1,200 mg total) by mouth 3 (three) times daily. (Patient taking differently: Take 3,600 mg by mouth at bedtime. )  . hydrochlorothiazide (HYDRODIURIL) 25 MG tablet Take 1/2 to 1 tablet po QD prn edema  . rizatriptan (MAXALT-MLT) 10 MG disintegrating tablet Take 1 tablet (10 mg total) by mouth as needed for migraine. May repeat in 2 hours if needed  . sulfamethoxazole-trimethoprim (BACTRIM DS,SEPTRA DS) 800-160 MG tablet Take 1 tablet by mouth 2 (two) times daily.  . [DISCONTINUED] doxycycline (VIBRAMYCIN) 100 MG capsule Take 1 capsule (100 mg total) by mouth 2 (two) times daily.  . [DISCONTINUED] fluconazole (DIFLUCAN) 150 MG tablet Take 1 tablet (150 mg total) by mouth daily. Take 1 tablet and repeat in 3 days.  . [DISCONTINUED] meloxicam (MOBIC) 15 MG tablet take 1 tablet by mouth once daily with meals  . [DISCONTINUED] nortriptyline (PAMELOR) 10 MG capsule Take 1 tablet  PO  at bedtime for 1 week then increase to 2 tablets for 1 week, then 3 tablets at bedtime thereafter (Patient taking differently: Take 10-30 mg by mouth at bedtime. Take 1 tablet  PO  at bedtime for 1 week then increase to 2 tablets for 1 week, then 3 tablets at bedtime thereafter)  . [DISCONTINUED] oxyCODONE-acetaminophen (PERCOCET/ROXICET) 5-325 MG tablet Take 2 tablets by mouth every 4 (four) hours as needed for  severe pain.  . [DISCONTINUED] propranolol (INDERAL) 20 MG tablet 1 tablet twice daily for 2 weeks, then take 2 tablets twice daily (Patient not taking: Reported on 04/03/2017)  . [DISCONTINUED] rizatriptan (MAXALT) 10 MG tablet Take 10 mg by mouth as needed for migraine. May repeat in 2 hours if needed  . [DISCONTINUED] rizatriptan (MAXALT) 10 MG tablet Take 1 tablet (10 mg total) by mouth as needed for migraine. May repeat in 2 hours if needed  . [DISCONTINUED] topiramate  (TOPAMAX) 25 MG tablet Take one tablet at night for one week, then take 2 tablets at night for one week, then take 3 tablets at night. (Patient taking differently: Take 50 mg by mouth at bedtime. )   No facility-administered encounter medications on file as of 05/15/2018.     Surgical History: Past Surgical History:  Procedure Laterality Date  . CESAREAN SECTION N/A 04/24/2016   Procedure: CESAREAN SECTION;  Surgeon: Brock Bad, MD;  Location: [facility] BIRTHING SUITES;  Service: Obstetrics;  Laterality: N/A;  . CHOLECYSTECTOMY    . TONSILLECTOMY AND ADENOIDECTOMY      Medical History: Past Medical History:  Diagnosis Date  . Abnormal finding on MRI of brain 01/02/2017  . Anxiety and depression    see progress notes -sw  . Common migraine with intractable migraine 01/02/2017  . Polycystic ovarian disease   . Psoriasis   . Rosacea     Family History: Family History  Problem Relation Age of Onset  . Breast cancer Mother   . Bone cancer Mother   . Migraines Mother   . Colon cancer Father   . Prostate cancer Father   . Brain cancer Maternal Grandmother   . Migraines Maternal Grandmother   . Migraines Sister     Social History   Socioeconomic History  . Marital status: Divorced    Spouse name: Not on file  . Number of children: 2  . Years of education: GED  . Highest education level: Not on file  Occupational History  . Occupation: N/A  Social Needs  . Financial resource strain: Not on file  . Food insecurity:    Worry: Not on file    Inability: Not on file  . Transportation needs:    Medical: Not on file    Non-medical: Not on file  Tobacco Use  . Smoking status: Current Some Day Smoker  . Smokeless tobacco: Never Used  Substance and Sexual Activity  . Alcohol use: No  . Drug use: No  . Sexual activity: Yes    Birth control/protection: None  Lifestyle  . Physical activity:    Days per week: Not on file    Minutes per session: Not on file  . Stress: Not on  file  Relationships  . Social connections:    Talks on phone: Not on file    Gets together: Not on file    Attends religious service: Not on file    Active member of club or organization: Not on file    Attends meetings of clubs or organizations: Not on file    Relationship status: Not on file  . Intimate partner violence:    Fear of current or ex partner: Not on file    Emotionally abused: Not on file    Physically abused: Not on file    Forced sexual activity: Not on file  Other Topics Concern  . Not on file  Social History Narrative   Lives at home w/ her children  Right-handed   Caffeine: 1 cup coffee per day      Review of Systems  Constitutional: Positive for activity change and fatigue. Negative for chills, fever and unexpected weight change.  HENT: Positive for congestion, postnasal drip, rhinorrhea, sinus pressure, sinus pain and sore throat. Negative for sneezing.   Eyes: Negative.  Negative for redness.  Respiratory: Positive for cough. Negative for chest tightness, shortness of breath and wheezing.   Cardiovascular: Negative for chest pain and palpitations.  Gastrointestinal: Negative for abdominal pain, constipation, diarrhea, nausea and vomiting.  Endocrine: Negative for cold intolerance, polydipsia, polyphagia and polyuria.  Genitourinary: Positive for dysuria, flank pain, frequency, pelvic pain, urgency and vaginal discharge.  Musculoskeletal: Positive for arthralgias. Negative for back pain, joint swelling and neck pain.  Skin: Negative for rash.  Allergic/Immunologic: Negative for environmental allergies.  Neurological: Positive for dizziness, weakness and headaches. Negative for tremors and numbness.  Hematological: Negative for adenopathy. Does not bruise/bleed easily.  Psychiatric/Behavioral: Positive for dysphoric mood. Negative for behavioral problems (Depression), sleep disturbance and suicidal ideas. The patient is nervous/anxious.     Today's Vitals    05/15/18 1035  BP: 123/82  Pulse: (!) 103  Resp: 16  Temp: (!) 97.2 F (36.2 C)  SpO2: 96%  Weight: 238 lb (108 kg)  Height: 5\' 7"  (1.702 m)    Physical Exam  Constitutional: She is oriented to person, place, and time. She appears well-developed and well-nourished. No distress.  HENT:  Head: Normocephalic and atraumatic.  Nose: Rhinorrhea present. Right sinus exhibits maxillary sinus tenderness and frontal sinus tenderness. Left sinus exhibits maxillary sinus tenderness and frontal sinus tenderness.  Mouth/Throat: Posterior oropharyngeal erythema present. No oropharyngeal exudate.  Eyes: Pupils are equal, round, and reactive to light. Conjunctivae and EOM are normal.  Neck: Normal range of motion. Neck supple. No JVD present. No tracheal deviation present. No thyromegaly present.  Cardiovascular: Normal rate, regular rhythm and normal heart sounds. Exam reveals no gallop and no friction rub.  No murmur heard. Pulmonary/Chest: Effort normal and breath sounds normal. No respiratory distress. She has no wheezes. She has no rales. She exhibits no tenderness.  Abdominal: Soft. Bowel sounds are normal. There is no tenderness.  Genitourinary:  Genitourinary Comments: Urine sample positive for trace blood.   Musculoskeletal: Normal range of motion.  Lymphadenopathy:    She has no cervical adenopathy.  Neurological: She is alert and oriented to person, place, and time. No cranial nerve deficit.  Skin: Skin is warm and dry. She is not diaphoretic.  Psychiatric: Her speech is normal and behavior is normal. Judgment and thought content normal. Her mood appears anxious. Cognition and memory are normal. She exhibits a depressed mood.  Nursing note and vitals reviewed.  Assessment/Plan:  1. Acute upper respiratory infection Start bactrim DS bid for 10 days. Use OTC medication to treat symptoms.  - sulfamethoxazole-trimethoprim (BACTRIM DS,SEPTRA DS) 800-160 MG tablet; Take 1 tablet by mouth 2  (two) times daily.  Dispense: 20 tablet; Refill: 0  2. Urinary tract infection with hematuria, site unspecified Bactrim to relieve UTI and URI. Send urine for culture and sensitivity and adjust antibiotics as indicated.  - CULTURE, URINE COMPREHENSIVE  3. Dysuria - POCT Urinalysis Dipstick positive for trace blood. Treat for UTI and adjust abx as indicated.   4. Vaginal candidiasis - fluconazole (DIFLUCAN) 150 MG tablet; Take 1 tablet (150 mg total) by mouth daily. Take 1 tablet and repeat in 3 days.  Dispense: 3 tablet; Refill: 1  5. Bilateral  lower extremity edema Start HCTZ 25mg , taking 1/2 to 1 tablet daily as needed for edema. Advised her to avoid exes's salt and to increase water intake.  - hydrochlorothiazide (HYDRODIURIL) 25 MG tablet; Take 1/2 to 1 tablet po QD prn edema  Dispense: 30 tablet; Refill: 3  6. Common migraine with intractable migraine Will prescribe maxalt orally disintegrating tablets. Frequently has nausea and vomiting with headaches and would get more benefit from orally disintegrating tablet.  - rizatriptan (MAXALT-MLT) 10 MG disintegrating tablet; Take 1 tablet (10 mg total) by mouth as needed for migraine. May repeat in 2 hours if needed  Dispense: 10 tablet; Refill: 2  7. 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 Will get new MRI of brain for further evaluation. Will get results to patient's neurologist.  - MR Brain W Wo Contrast; Future  8. Other symptoms and signs involving the nervous system - MR Brain W Wo Contrast; Future  10. Generalized anxiety disorder Start buspirone 10mg . Advised her to take 1/2 to 1 tablet twice daily as needed for acute anxiety.  - busPIRone (BUSPAR) 10 MG tablet; Take 1/2 to 1 tablet po BID prn anxiety  Dispense: 60 tablet; Refill: 3  11. Screening for breast cancer - MM DIGITAL SCREENING BILATERAL; Future  12. Family history of breast cancer in mother Mother, grandmother and  great-grandmother have all been diagnosed with and treated for breast cancer . - MM DIGITAL SCREENING BILATERAL; Future  General Counseling: Zyria verbalizes understanding of the findings of todays visit and agrees with plan of treatment. I have discussed any further diagnostic evaluation that may be needed or ordered today. We also reviewed her medications today. she has been encouraged to call the office with any questions or concerns that should arise related to todays visit.    Counseling:  Rest and increase fluids. Continue using OTC medication to control symptoms.   This patient was seen by Vincent Gros, FNP- C in Collaboration with Dr Lyndon Code as a part of collaborative care agreement  Orders Placed This Encounter  Procedures  . CULTURE, URINE COMPREHENSIVE  . MR Brain W Wo Contrast  . MM DIGITAL SCREENING BILATERAL  . POCT Urinalysis Dipstick    Meds ordered this encounter  Medications  . rizatriptan (MAXALT-MLT) 10 MG disintegrating tablet    Sig: Take 1 tablet (10 mg total) by mouth as needed for migraine. May repeat in 2 hours if needed    Dispense:  10 tablet    Refill:  2    Order Specific Question:   Supervising Provider    Answer:   Lyndon Code [[id number]]  . busPIRone (BUSPAR) 10 MG tablet    Sig: Take 1/2 to 1 tablet po BID prn anxiety    Dispense:  60 tablet    Refill:  3    Order Specific Question:   Supervising Provider    Answer:   Lyndon Code [[id number]]  . sulfamethoxazole-trimethoprim (BACTRIM DS,SEPTRA DS) 800-160 MG tablet    Sig: Take 1 tablet by mouth 2 (two) times daily.    Dispense:  20 tablet    Refill:  0    Order Specific Question:   Supervising Provider    Answer:   Lyndon Code [[id number]]  . fluconazole (DIFLUCAN) 150 MG tablet    Sig: Take 1 tablet (150 mg total) by mouth daily. Take 1 tablet and repeat in 3 days.    Dispense:  3 tablet  Refill:  1    Order Specific Question:   Supervising Provider    Answer:   Lyndon Code [[id number]]   . hydrochlorothiazide (HYDRODIURIL) 25 MG tablet    Sig: Take 1/2 to 1 tablet po QD prn edema    Dispense:  30 tablet    Refill:  3    Order Specific Question:   Supervising Provider    Answer:   Lyndon Code [[id number]]    Time spent: 41 Minutes     Dr Lyndon Code Internal medicine

## 2018-05-18 LAB — CULTURE, URINE COMPREHENSIVE

## 2018-05-20 DIAGNOSIS — F411 Generalized anxiety disorder: Secondary | ICD-10-CM | POA: Insufficient documentation

## 2018-05-20 DIAGNOSIS — R600 Localized edema: Secondary | ICD-10-CM | POA: Insufficient documentation

## 2018-05-20 DIAGNOSIS — R300 Dysuria: Secondary | ICD-10-CM | POA: Insufficient documentation

## 2018-05-20 DIAGNOSIS — Z803 Family history of malignant neoplasm of breast: Secondary | ICD-10-CM | POA: Insufficient documentation

## 2018-05-20 DIAGNOSIS — B3731 Acute candidiasis of vulva and vagina: Secondary | ICD-10-CM | POA: Insufficient documentation

## 2018-05-20 DIAGNOSIS — B373 Candidiasis of vulva and vagina: Secondary | ICD-10-CM | POA: Insufficient documentation

## 2018-05-20 DIAGNOSIS — R319 Hematuria, unspecified: Secondary | ICD-10-CM

## 2018-05-20 DIAGNOSIS — N390 Urinary tract infection, site not specified: Secondary | ICD-10-CM | POA: Insufficient documentation

## 2018-05-20 DIAGNOSIS — Z0001 Encounter for general adult medical examination with abnormal findings: Secondary | ICD-10-CM | POA: Insufficient documentation

## 2018-05-20 DIAGNOSIS — R29818 Other symptoms and signs involving the nervous system: Secondary | ICD-10-CM | POA: Insufficient documentation

## 2018-05-20 DIAGNOSIS — J069 Acute upper respiratory infection, unspecified: Secondary | ICD-10-CM | POA: Insufficient documentation

## 2018-06-02 ENCOUNTER — Other Ambulatory Visit: Payer: Self-pay

## 2018-06-02 ENCOUNTER — Telehealth: Payer: Self-pay

## 2018-06-02 MED ORDER — DIAZEPAM 5 MG PO TABS: ORAL_TABLET | ORAL | 0 refills | Status: DC

## 2018-06-02 NOTE — Telephone Encounter (Signed)
Called in phar [facility] diazepam 5 mg 1 tab po 1 hr  prior procedure and repeat 15 min prior to procedure and also pt advised that we called in phar

## 2018-06-03 ENCOUNTER — Ambulatory Visit
Admission: RE | Admit: 2018-06-03 | Discharge: 2018-06-03 | Disposition: A | Discharge status: Home / Self Care | Payer: Medicaid Other | Source: Ambulatory Visit | Attending: Nurse Practitioner | Admitting: Nurse Practitioner

## 2018-06-03 DIAGNOSIS — R29818 Other symptoms and signs involving the nervous system: Secondary | ICD-10-CM

## 2018-06-03 DIAGNOSIS — G9389 Other specified disorders of brain: Secondary | ICD-10-CM | POA: Insufficient documentation

## 2018-06-03 DIAGNOSIS — R9082 White matter disease, unspecified: Secondary | ICD-10-CM

## 2018-06-03 MED ORDER — GADOBENATE DIMEGLUMINE 529 MG/ML IV SOLN
20.0000 mL | Freq: Once | INTRAVENOUS | Status: AC | PRN
  Administered 2018-06-03: 20 mL via INTRAVENOUS

## 2018-06-12 ENCOUNTER — Telehealth: Payer: Self-pay | Admitting: Nurse Practitioner

## 2018-06-12 NOTE — Telephone Encounter (Signed)
Patient called regarding mri results

## 2018-06-12 NOTE — Telephone Encounter (Signed)
Please let her know that the MRI continues to show multiple, scattered areas of white matter disease. This is stable and essentially unchanged since her last MRI.

## 2018-06-15 ENCOUNTER — Emergency Department (HOSPITAL_COMMUNITY)
Admission: EM | Admit: 2018-06-15 | Discharge: 2018-06-15 | Disposition: A | Discharge status: Home / Self Care | Payer: Medicaid Other | Attending: Emergency Medicine | Admitting: Emergency Medicine

## 2018-06-15 ENCOUNTER — Encounter (HOSPITAL_COMMUNITY): Payer: Self-pay | Admitting: Emergency Medicine

## 2018-06-15 DIAGNOSIS — Z79899 Other long term (current) drug therapy: Secondary | ICD-10-CM | POA: Diagnosis not present

## 2018-06-15 DIAGNOSIS — F17210 Nicotine dependence, cigarettes, uncomplicated: Secondary | ICD-10-CM | POA: Insufficient documentation

## 2018-06-15 DIAGNOSIS — K5900 Constipation, unspecified: Secondary | ICD-10-CM | POA: Insufficient documentation

## 2018-06-15 MED ORDER — POLYETHYLENE GLYCOL 3350 17 G PO PACK
34.0000 g | PACK | Freq: Once | ORAL | Status: AC
  Administered 2018-06-15: 34 g via ORAL
  Filled 2018-06-15: qty 2

## 2018-06-15 MED ORDER — MAGNESIUM HYDROXIDE 400 MG/5ML PO SUSP
15.0000 mL | Freq: Once | ORAL | Status: AC
  Administered 2018-06-15: 15 mL via ORAL
  Filled 2018-06-15: qty 30

## 2018-06-15 MED ORDER — KETOROLAC TROMETHAMINE 30 MG/ML IJ SOLN
15.0000 mg | Freq: Once | INTRAMUSCULAR | Status: AC
  Administered 2018-06-15: 15 mg via INTRAVENOUS
  Filled 2018-06-15: qty 1

## 2018-06-15 MED ORDER — LORAZEPAM 2 MG/ML IJ SOLN
1.0000 mg | Freq: Once | INTRAMUSCULAR | Status: AC
  Administered 2018-06-15: 1 mg via INTRAVENOUS
  Filled 2018-06-15: qty 1

## 2018-06-15 MED ORDER — LACTATED RINGERS IV BOLUS
1000.0000 mL | Freq: Once | INTRAVENOUS | Status: AC
  Administered 2018-06-15: 1000 mL via INTRAVENOUS

## 2018-06-15 NOTE — ED Provider Notes (Signed)
[facility] EMERGENCY DEPARTMENT Provider Note   CSN: [id number] Arrival date & time: 06/15/18  1307     History   Chief Complaint Chief Complaint  Patient presents with  . Constipation    HPI Andrea [name] is a 37 y.o. female.  HPI  37 year old female with constipation.  She has not had a bowel movement in almost a week.  She describes increasing abdominal and rectal pain.  She is now having difficulty voiding in the past day.  She is only able to void a few ounces at a time.  She reports that she has been eating with her small child recently and having many foods such as dairy that she normally does not eat.  Past Medical History:  Diagnosis Date  . Abnormal finding on MRI of brain 01/02/2017  . Anxiety and depression    see progress notes -sw  . Common migraine with intractable migraine 01/02/2017  . Polycystic ovarian disease   . Psoriasis   . Rosacea     Patient Active Problem List   Diagnosis Date Noted  . Acute upper respiratory infection 05/20/2018  . Urinary tract infection with hematuria 05/20/2018  . Dysuria 05/20/2018  . Vaginal candidiasis 05/20/2018  . Bilateral lower extremity edema 05/20/2018  . Other symptoms and signs involving the nervous system 05/20/2018  . Generalized anxiety disorder 05/20/2018  . Screening for breast cancer 05/20/2018  . Family history of breast cancer in mother 05/20/2018  . Common migraine with intractable migraine 01/02/2017  . White matter 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 01/02/2017  . Postoperative wound cellulitis 05/08/2016  . S/P cesarean section 04/24/2016  . Status post primary low transverse cesarean section 04/24/2016  . Oligohydramnios 04/19/2016  . Supervision of pregnancy with history of pre-term labor in first trimester 10/31/2015    Past Surgical History:  Procedure Laterality Date  . CESAREAN SECTION N/A 04/24/2016   Procedure: CESAREAN SECTION;  Surgeon: Brock Bad, MD;  Location: [facility] BIRTHING SUITES;   Service: Obstetrics;  Laterality: N/A;  . CHOLECYSTECTOMY    . TONSILLECTOMY AND ADENOIDECTOMY       OB History    Gravida  2   Para  2   Term  0   Preterm  2   AB  0   Living  2     SAB  0   TAB  0   Ectopic  0   Multiple  0   Live Births  2            Home Medications    Prior to Admission medications   Medication Sig Start Date End Date Taking? Authorizing Provider  escitalopram (LEXAPRO) 20 MG tablet Take 1 tablet by mouth daily. 11/28/15  Yes [provider]  aspirin-acetaminophen-caffeine (EXCEDRIN MIGRAINE) [phone] MG tablet Take 2 tablets by mouth every 6 (six) hours as needed for headache or migraine.    [provider]  busPIRone (BUSPAR) 10 MG tablet Take 1/2 to 1 tablet po BID prn anxiety 05/15/18   Carlean Jews, NP  celecoxib (CELEBREX) 200 MG capsule Take 1 capsule by mouth daily.    [provider]  cyclobenzaprine (FLEXERIL) 10 MG tablet Take 1 tablet (10 mg total) by mouth every 8 (eight) hours as needed for muscle spasms. Patient taking differently: Take 30 mg by mouth at bedtime.  06/13/16   Orvilla Cornwall A, CNM  diazepam (VALIUM) 5 MG tablet Take 1 tab po 1 hour prior  To procedure  And  repeat 15 min prior  procedure 06/02/18   Carlean Jews, NP  DULoxetine (CYMBALTA) 60 MG capsule Take 1 capsule by mouth daily. 05/20/18   [provider]  fluticasone (FLONASE) 50 MCG/ACT nasal spray Place 1-2 sprays into both nostrils daily.    [provider]  gabapentin (NEURONTIN) 600 MG tablet Take 2 tablets (1,200 mg total) by mouth 3 (three) times daily. Patient taking differently: Take 3,600 mg by mouth at bedtime.  06/13/16   Orvilla Cornwall A, CNM  hydrochlorothiazide (HYDRODIURIL) 25 MG tablet Take 1/2 to 1 tablet po QD prn edema 05/15/18   Carlean Jews, NP  meloxicam (MOBIC) 15 MG tablet Take 1 tablet by mouth daily.    [provider]  phentermine (ADIPEX-P) 37.5 MG tablet Take 1  tablet by mouth daily.    [provider]  propranolol (INDERAL) 10 MG tablet Take 1 tablet by mouth daily.    [provider]  rizatriptan (MAXALT-MLT) 10 MG disintegrating tablet Take 1 tablet (10 mg total) by mouth as needed for migraine. May repeat in 2 hours if needed 05/15/18   Carlean Jews, NP  topiramate (TOPAMAX) 25 MG tablet Take 1 tablet by mouth daily.    [provider]    Family History Family History  Problem Relation Age of Onset  . Breast cancer Mother   . Bone cancer Mother   . Migraines Mother   . Colon cancer Father   . Prostate cancer Father   . Brain cancer Maternal Grandmother   . Migraines Maternal Grandmother   . Migraines Sister     Social History Social History   Tobacco Use  . Smoking status: Current Some Day Smoker    Packs/day: 0.50    Types: Cigarettes  . Smokeless tobacco: Never Used  Substance Use Topics  . Alcohol use: No  . Drug use: No     Allergies   Acetaminophen; Codeine; Hydrocodone; Metronidazole; and Morphine and related   Review of Systems Review of Systems  All systems reviewed and negative, other than as noted in HPI.  Physical Exam Updated Vital Signs BP (!) 97/49   Pulse 79   Temp 98.1 F (36.7 C) (Oral)   Resp 16   LMP 05/18/2018   SpO2 97%   Physical Exam  Constitutional: She appears well-developed and well-nourished. No distress.  HENT:  Head: Normocephalic and atraumatic.  Eyes: Conjunctivae are normal. Right eye exhibits no discharge. Left eye exhibits no discharge.  Neck: Neck supple.  Cardiovascular: Normal rate, regular rhythm and normal heart sounds. Exam reveals no gallop and no friction rub.  No murmur heard. Pulmonary/Chest: Effort normal and breath sounds normal. No respiratory distress.  Abdominal: Soft. She exhibits distension. There is tenderness.  Appears very uncomfortable.  She is diffusely tender.  Mild distention.  Genitourinary:  Genitourinary Comments:  Hard stool stool felt at fingertip on digital rectal examination but cannot remove a significant amount digitally.  Musculoskeletal: She exhibits no edema or tenderness.  Neurological: She is alert.  Skin: Skin is warm and dry.  Psychiatric: She has a normal mood and affect. Her behavior is normal. Thought content normal.  Nursing note and vitals reviewed.    ED Treatments / Results  Labs (all labs ordered are listed, but only abnormal results are displayed) Labs Reviewed - No data to display  EKG None  Radiology No results found.  Procedures Procedures (including critical care time)  Medications Ordered in ED Medications - No data  to display   Initial Impression / Assessment and Plan / ED Course  I have reviewed the triage vital signs and the nursing notes.  Pertinent labs & imaging results that were available during my care of the patient were reviewed by me and considered in my medical decision making (see chart for details).     37 year old female with constipation.  She is absolutely miserable.  She was treated in the ER with various modalities including an enema with some improvement.  Bowel regimen was discussed as well as return precautions.  Final Clinical Impressions(s) / ED Diagnoses   Final diagnoses:  Constipation, unspecified constipation type    ED Discharge Orders    None       Raeford RazorKohut, Afshin Chrystal, MD 06/25/18 [phone]

## 2018-06-15 NOTE — ED Notes (Signed)
Good result noted following enema.  Stool well formed with blood tingled fluids.  Pt says she feels better.

## 2018-06-15 NOTE — ED Notes (Signed)
Pt voided without difficulty.

## 2018-06-15 NOTE — Discharge Instructions (Signed)
Get a bottle of miralax max 4 doses into a 32 ounce bottle of a sports drink.  Treat this throughout the rest of the day.  Once you have few more good bowel movements switched to taking Colace 100 mg twice a day.  Drink plenty of fluids.

## 2018-06-15 NOTE — ED Notes (Signed)
Resting quietly

## 2018-06-15 NOTE — ED Triage Notes (Signed)
Pt reports no bowel movement in over a week.  C/o rectal pain and pressure with some abd pain.  States the rectal pressure is worse than belly pain.

## 2018-06-15 NOTE — ED Notes (Signed)
Pt says she has not had a BM in 2 weeks.  Took OTC meds with no relief.

## 2018-06-16 ENCOUNTER — Emergency Department (HOSPITAL_COMMUNITY): Payer: Medicaid Other

## 2018-06-16 ENCOUNTER — Inpatient Hospital Stay (HOSPITAL_COMMUNITY)
Admission: EM | Admit: 2018-06-16 | Discharge: 2018-06-18 | DRG: 390 | Disposition: A | Discharge status: Home / Self Care | Payer: Medicaid Other | Attending: Internal Medicine | Admitting: Internal Medicine

## 2018-06-16 ENCOUNTER — Other Ambulatory Visit: Payer: Self-pay

## 2018-06-16 ENCOUNTER — Encounter (HOSPITAL_COMMUNITY): Payer: Self-pay | Admitting: *Deleted

## 2018-06-16 DIAGNOSIS — F410 Panic disorder [episodic paroxysmal anxiety] without agoraphobia: Secondary | ICD-10-CM | POA: Diagnosis present

## 2018-06-16 DIAGNOSIS — F419 Anxiety disorder, unspecified: Secondary | ICD-10-CM

## 2018-06-16 DIAGNOSIS — E876 Hypokalemia: Secondary | ICD-10-CM | POA: Diagnosis present

## 2018-06-16 DIAGNOSIS — K5641 Fecal impaction: Secondary | ICD-10-CM

## 2018-06-16 DIAGNOSIS — D72829 Elevated white blood cell count, unspecified: Secondary | ICD-10-CM | POA: Diagnosis present

## 2018-06-16 DIAGNOSIS — Z885 Allergy status to narcotic agent status: Secondary | ICD-10-CM

## 2018-06-16 DIAGNOSIS — I10 Essential (primary) hypertension: Secondary | ICD-10-CM | POA: Diagnosis present

## 2018-06-16 DIAGNOSIS — R338 Other retention of urine: Secondary | ICD-10-CM

## 2018-06-16 DIAGNOSIS — K6289 Other specified diseases of anus and rectum: Secondary | ICD-10-CM | POA: Diagnosis present

## 2018-06-16 DIAGNOSIS — K5900 Constipation, unspecified: Secondary | ICD-10-CM

## 2018-06-16 DIAGNOSIS — F17210 Nicotine dependence, cigarettes, uncomplicated: Secondary | ICD-10-CM | POA: Diagnosis present

## 2018-06-16 DIAGNOSIS — Z79899 Other long term (current) drug therapy: Secondary | ICD-10-CM

## 2018-06-16 DIAGNOSIS — F32A Depression, unspecified: Secondary | ICD-10-CM | POA: Diagnosis present

## 2018-06-16 DIAGNOSIS — K529 Noninfective gastroenteritis and colitis, unspecified: Secondary | ICD-10-CM | POA: Diagnosis present

## 2018-06-16 DIAGNOSIS — E282 Polycystic ovarian syndrome: Secondary | ICD-10-CM | POA: Diagnosis present

## 2018-06-16 DIAGNOSIS — Z888 Allergy status to other drugs, medicaments and biological substances status: Secondary | ICD-10-CM

## 2018-06-16 DIAGNOSIS — R339 Retention of urine, unspecified: Secondary | ICD-10-CM

## 2018-06-16 DIAGNOSIS — F329 Major depressive disorder, single episode, unspecified: Secondary | ICD-10-CM | POA: Diagnosis present

## 2018-06-16 DIAGNOSIS — Z7982 Long term (current) use of aspirin: Secondary | ICD-10-CM

## 2018-06-16 LAB — BASIC METABOLIC PANEL
Anion gap: 12 (ref 5–15)
BUN: 8 mg/dL (ref 6–20)
CHLORIDE: 104 mmol/L (ref 98–111)
CO2: 23 mmol/L (ref 22–32)
CREATININE: 0.85 mg/dL (ref 0.44–1.00)
Calcium: 8.7 mg/dL — ABNORMAL LOW (ref 8.9–10.3)
GFR calc non Af Amer: >60 mL/min (ref >60)
Glucose, Bld: 120 mg/dL — ABNORMAL HIGH (ref 70–99)
Potassium: 2.7 mmol/L — CL (ref 3.5–5.1)
SODIUM: 139 mmol/L (ref 135–145)

## 2018-06-16 LAB — URINALYSIS, ROUTINE W REFLEX MICROSCOPIC
Bilirubin Urine: NEGATIVE
Glucose, UA: NEGATIVE mg/dL
Hgb urine dipstick: NEGATIVE
Ketones, ur: 5 mg/dL — AB
LEUKOCYTES UA: NEGATIVE
NITRITE: NEGATIVE
PROTEIN: NEGATIVE mg/dL
SPECIFIC GRAVITY, URINE: 1.011 (ref 1.005–1.030)
pH: 6 (ref 5.0–8.0)

## 2018-06-16 LAB — CBC WITH DIFFERENTIAL/PLATELET
BASOS PCT: 0 %
Basophils Absolute: 0 10*3/uL (ref 0.0–0.1)
EOS ABS: 0.1 10*3/uL (ref 0.0–0.7)
Eosinophils Relative: 1 %
HEMATOCRIT: 41.1 % (ref 36.0–46.0)
HEMOGLOBIN: 14.6 g/dL (ref 12.0–15.0)
LYMPHS ABS: 2.1 10*3/uL (ref 0.7–4.0)
Lymphocytes Relative: 17 %
MCH: 30.2 pg (ref 26.0–34.0)
MCHC: 35.5 g/dL (ref 30.0–36.0)
MCV: 85.1 fL (ref 78.0–100.0)
MONOS PCT: 4 %
Monocytes Absolute: 0.6 10*3/uL (ref 0.1–1.0)
NEUTROS ABS: 9.9 10*3/uL — AB (ref 1.7–7.7)
NEUTROS PCT: 78 %
Platelets: 249 10*3/uL (ref 150–400)
RBC: 4.83 MIL/uL (ref 3.87–5.11)
RDW: 13.4 % (ref 11.5–15.5)
WBC: 12.7 10*3/uL — AB (ref 4.0–10.5)

## 2018-06-16 LAB — MAGNESIUM: MAGNESIUM: 2.2 mg/dL (ref 1.7–2.4)

## 2018-06-16 MED ORDER — FENTANYL CITRATE (PF) 100 MCG/2ML IJ SOLN
50.0000 ug | Freq: Once | INTRAMUSCULAR | Status: AC
  Administered 2018-06-16: 50 ug via INTRAVENOUS
  Filled 2018-06-16: qty 2

## 2018-06-16 MED ORDER — ONDANSETRON HCL 4 MG/2ML IJ SOLN
4.0000 mg | Freq: Four times a day (QID) | INTRAMUSCULAR | Status: DC | PRN
  Administered 2018-06-16 – 2018-06-17 (×2): 4 mg via INTRAVENOUS
  Filled 2018-06-16 (×2): qty 2

## 2018-06-16 MED ORDER — IOPAMIDOL (ISOVUE-300) INJECTION 61%
100.0000 mL | Freq: Once | INTRAVENOUS | Status: AC | PRN
  Administered 2018-06-16: 100 mL via INTRAVENOUS

## 2018-06-16 MED ORDER — POTASSIUM CHLORIDE IN NACL 40-0.9 MEQ/L-% IV SOLN
INTRAVENOUS | Status: DC
  Administered 2018-06-16 – 2018-06-18 (×5): 125 mL/h via INTRAVENOUS

## 2018-06-16 MED ORDER — LORAZEPAM 2 MG/ML IJ SOLN
1.0000 mg | Freq: Once | INTRAMUSCULAR | Status: AC
  Administered 2018-06-16: 1 mg via INTRAVENOUS
  Filled 2018-06-16: qty 1

## 2018-06-16 MED ORDER — ACETAMINOPHEN 325 MG PO TABS
650.0000 mg | ORAL_TABLET | Freq: Four times a day (QID) | ORAL | Status: DC | PRN
  Administered 2018-06-17: 650 mg via ORAL
  Filled 2018-06-16: qty 2

## 2018-06-16 MED ORDER — MAGNESIUM HYDROXIDE 400 MG/5ML PO SUSP
960.0000 mL | Freq: Once | ORAL | Status: AC
  Administered 2018-06-16: 960 mL via RECTAL
  Filled 2018-06-16: qty 473

## 2018-06-16 MED ORDER — ONDANSETRON HCL 4 MG PO TABS: 4.0000 mg | ORAL_TABLET | Freq: Four times a day (QID) | ORAL | Status: DC | PRN

## 2018-06-16 MED ORDER — FAMOTIDINE IN NACL 20-0.9 MG/50ML-% IV SOLN
20.0000 mg | Freq: Once | INTRAVENOUS | Status: AC
  Administered 2018-06-16: 20 mg via INTRAVENOUS
  Filled 2018-06-16: qty 50

## 2018-06-16 MED ORDER — KETOROLAC TROMETHAMINE 30 MG/ML IJ SOLN
30.0000 mg | Freq: Once | INTRAMUSCULAR | Status: AC
  Administered 2018-06-16: 30 mg via INTRAVENOUS
  Filled 2018-06-16: qty 1

## 2018-06-16 MED ORDER — ENOXAPARIN SODIUM 40 MG/0.4ML ~~LOC~~ SOLN
40.0000 mg | SUBCUTANEOUS | Status: DC
  Administered 2018-06-16 – 2018-06-17 (×2): 40 mg via SUBCUTANEOUS
  Filled 2018-06-16 (×2): qty 0.4

## 2018-06-16 MED ORDER — FENTANYL CITRATE (PF) 100 MCG/2ML IJ SOLN
50.0000 ug | Freq: Once | INTRAMUSCULAR | Status: AC
Start: 2018-06-16 — End: 2018-06-16
  Administered 2018-06-16: 50 ug via INTRAVENOUS
  Filled 2018-06-16: qty 2

## 2018-06-16 MED ORDER — ACETAMINOPHEN 650 MG RE SUPP: 650.0000 mg | Freq: Four times a day (QID) | RECTAL | Status: DC | PRN

## 2018-06-16 MED ORDER — POTASSIUM CHLORIDE 10 MEQ/100ML IV SOLN
10.0000 meq | Freq: Once | INTRAVENOUS | Status: AC
  Administered 2018-06-16: 10 meq via INTRAVENOUS
  Filled 2018-06-16: qty 100

## 2018-06-16 NOTE — ED Notes (Signed)
CRITICAL VALUE ALERT  Critical Value:  K 2.7 Date & Time Notied:  06/16/2018, 1526  Provider Notified: Dr. Rubin PayorPickering  Orders Received/Actions taken: see chart

## 2018-06-16 NOTE — ED Notes (Signed)
Almira CoasterGina, [facility] notified of need for SMOG enema

## 2018-06-16 NOTE — ED Notes (Signed)
Patient transported to CT 

## 2018-06-16 NOTE — ED Triage Notes (Signed)
Pt c/o rectal pain and not being able to urinate. Pt reports she was here at [facility] yesterday and was given enema with bowel movement result. Pt reports she last voided normally when she was here at the hospital. Since she left pt reports the rectal pain has continued and she has only been able to "dripple" when she urinates.

## 2018-06-16 NOTE — ED Provider Notes (Signed)
[facility] EMERGENCY DEPARTMENT Provider Note   CSN: [id number] Arrival date & time: 06/16/18  1337     History   Chief Complaint Chief Complaint  Patient presents with  . Rectal Pain  . Urinary Retention    HPI Andrea [name] is a 37 y.o. female.  HPI Patient presents with constipation rectal pain and urinary retention.  Seen in the ER yesterday for constipation and had some relief with enema.  States she has had severe pain since then.  States really had no more bowel movements.  States she has been able to urinate since earlier today.  States it only dribbles out when she tries.  Pain is severe.  States she cannot sit down due to rectal pain.  No bleeding.  No fevers.  She states the pharmacy gave her some magnesium citrate to help with the constipation. Past Medical History:  Diagnosis Date  . Abnormal finding on MRI of brain 01/02/2017  . Anxiety and depression    see progress notes -sw  . Common migraine with intractable migraine 01/02/2017  . Polycystic ovarian disease   . Psoriasis   . Rosacea     Patient Active Problem List   Diagnosis Date Noted  . Acute upper respiratory infection 05/20/2018  . Urinary tract infection with hematuria 05/20/2018  . Dysuria 05/20/2018  . Vaginal candidiasis 05/20/2018  . Bilateral lower extremity edema 05/20/2018  . Other symptoms and signs involving the nervous system 05/20/2018  . Generalized anxiety disorder 05/20/2018  . Screening for breast cancer 05/20/2018  . Family history of breast cancer in mother 05/20/2018  . Common migraine with intractable migraine 01/02/2017  . White matter 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 01/02/2017  . Postoperative wound cellulitis 05/08/2016  . S/P cesarean section 04/24/2016  . Status post primary low transverse cesarean section 04/24/2016  . Oligohydramnios 04/19/2016  . Supervision of pregnancy with history of pre-term labor in first trimester 10/31/2015    Past Surgical History:  Procedure  Laterality Date  . CESAREAN SECTION N/A 04/24/2016   Procedure: CESAREAN SECTION;  Surgeon: Brock Bad, MD;  Location: [facility] BIRTHING SUITES;  Service: Obstetrics;  Laterality: N/A;  . CHOLECYSTECTOMY    . TONSILLECTOMY AND ADENOIDECTOMY       OB History    Gravida  2   Para  2   Term  0   Preterm  2   AB  0   Living  2     SAB  0   TAB  0   Ectopic  0   Multiple  0   Live Births  2            Home Medications    Prior to Admission medications   Medication Sig Start Date End Date Taking? Authorizing Provider  aspirin-acetaminophen-caffeine (EXCEDRIN MIGRAINE) [phone] MG tablet Take 2 tablets by mouth every 6 (six) hours as needed for headache or migraine.   Yes [provider]  busPIRone (BUSPAR) 10 MG tablet Take 1/2 to 1 tablet po BID prn anxiety Patient taking differently: Take 5-10 mg by mouth 2 (two) times daily as needed (for anxiety).  05/15/18  Yes Boscia, Kathlynn Grate, NP  cyclobenzaprine (FLEXERIL) 10 MG tablet Take 1 tablet (10 mg total) by mouth every 8 (eight) hours as needed for muscle spasms. Patient taking differently: Take 30 mg by mouth at bedtime.  06/13/16  Yes Denney, Rachelle A, CNM  docusate sodium (COLACE) 100 MG capsule Take 100 mg by mouth daily  as needed for mild constipation.   Yes [provider]  DULoxetine (CYMBALTA) 60 MG capsule Take 1 capsule by mouth daily. 05/20/18  Yes [provider]  gabapentin (NEURONTIN) 600 MG tablet Take 2 tablets (1,200 mg total) by mouth 3 (three) times daily. Patient taking differently: Take 3,600 mg by mouth at bedtime.  06/13/16  Yes Denney, Rachelle A, CNM  hydrochlorothiazide (HYDRODIURIL) 25 MG tablet Take 1/2 to 1 tablet po QD prn edema Patient taking differently: Take 25 mg by mouth at bedtime.  05/15/18  Yes Carlean Jews, NP  magnesium citrate SOLN Take 1 Bottle by mouth once.   Yes [provider]  rizatriptan (MAXALT-MLT) 10 MG disintegrating tablet Take  1 tablet (10 mg total) by mouth as needed for migraine. May repeat in 2 hours if needed 05/15/18  Yes Boscia, Kathlynn Grate, NP  diazepam (VALIUM) 5 MG tablet Take 1 tab po 1 hour prior  To procedure  And repeat 15 min prior  procedure Patient not taking: Reported on 06/15/2018 06/02/18   Carlean Jews, NP  phentermine (ADIPEX-P) 37.5 MG tablet Take 1 tablet by mouth daily.    [provider]    Family History Family History  Problem Relation Age of Onset  . Breast cancer Mother   . Bone cancer Mother   . Migraines Mother   . Colon cancer Father   . Prostate cancer Father   . Brain cancer Maternal Grandmother   . Migraines Maternal Grandmother   . Migraines Sister     Social History Social History   Tobacco Use  . Smoking status: Current Some Day Smoker    Packs/day: 0.50    Types: Cigarettes  . Smokeless tobacco: Never Used  Substance Use Topics  . Alcohol use: No  . Drug use: No     Allergies   Codeine; Hydrocodone; Metronidazole; and Morphine and related   Review of Systems Review of Systems  Constitutional: Negative for appetite change.  HENT: Negative for congestion.   Respiratory: Negative for shortness of breath.   Gastrointestinal: Positive for abdominal pain and rectal pain.  Genitourinary: Positive for difficulty urinating.  Musculoskeletal: Negative for back pain.  Neurological: Negative for weakness.  Hematological: Negative for adenopathy.  Psychiatric/Behavioral: Negative for behavioral problems.     Physical Exam Updated Vital Signs BP 123/84   Pulse (!) 102   Temp 98.8 F (37.1 C) (Oral)   Resp (!) 22   Ht 5\' 7"  (1.702 m)   Wt 95.3 kg (210 lb)   LMP 05/18/2018   SpO2 99%   BMI 32.89 kg/m   Physical Exam  Constitutional:  Patient appears uncomfortable  HENT:  Head: Normocephalic.  Eyes: EOM are normal.  Neck: Neck supple.  Cardiovascular: Normal rate.  Abdominal: She exhibits mass.  Suprapubic mass.  Genitourinary:    Genitourinary Comments: Patient with large amount of stool on rectal but not right up against the sphincter.  Tenderness with rectal exam.  No mass felt.  Neurological: She is alert.  Skin: Skin is warm. Capillary refill takes less than 2 seconds.     ED Treatments / Results  Labs (all labs ordered are listed, but only abnormal results are displayed) Labs Reviewed  CBC WITH DIFFERENTIAL/PLATELET - Abnormal; Notable for the following components:      Result Value   WBC 12.7 (*)    Neutro Abs 9.9 (*)    All other components within normal limits  BASIC METABOLIC PANEL - Abnormal; Notable  for the following components:   Potassium 2.7 (*)    Glucose, Bld 120 (*)    Calcium 8.7 (*)    All other components within normal limits  URINALYSIS, ROUTINE W REFLEX MICROSCOPIC - Abnormal; Notable for the following components:   Ketones, ur 5 (*)    All other components within normal limits    EKG None  Radiology Ct Abdomen Pelvis W Contrast  Result Date: 06/16/2018 CLINICAL DATA:  Rectal pain, constipation and inability to urinate. EXAM: CT ABDOMEN AND PELVIS WITH CONTRAST TECHNIQUE: Multidetector CT imaging of the abdomen and pelvis was performed using the standard protocol following bolus administration of intravenous contrast. CONTRAST:  100mL ISOVUE-300 IOPAMIDOL (ISOVUE-300) INJECTION 61% COMPARISON:  None. FINDINGS: Lower chest: No acute abnormality. Hepatobiliary: No focal liver abnormality is seen. Status post cholecystectomy. No biliary dilatation. Pancreas: Unremarkable. No pancreatic ductal dilatation or surrounding inflammatory changes. Spleen: Normal in size without focal abnormality. Adrenals/Urinary Tract: Adrenal glands are unremarkable. Kidneys are normal, without renal calculi, focal lesion, or hydronephrosis. Bladder is decompressed by a Foley catheter. Stomach/Bowel: No evidence of bowel obstruction. There is some scattered fluid in small bowel and the colon. A large amount of  fecal material occupies a distended rectum with stool ball measuring up to 7.5 cm in transverse diameter and approximately 11 cm in height. There is some associated inflammation in the perirectal fat without evidence of abscess or free fluid. No free air. Vascular/Lymphatic: No significant vascular findings are present. No enlarged abdominal or pelvic lymph nodes. Reproductive: Uterus and bilateral adnexa are unremarkable. Other: No hernias identified. Musculoskeletal: No acute or significant osseous findings. IMPRESSION: Fecal impaction with large stool ball distending the rectum. There is associated inflammation in the perirectal fat without abscess or evidence of perforation. Electronically Signed   By: Irish LackGlenn  Yamagata M.D.   On: 06/16/2018 18:29    Procedures Procedures (including critical care time)  Medications Ordered in ED Medications  sorbitol, milk of mag, mineral oil, glycerin (SMOG) enema (has no administration in time range)  fentaNYL (SUBLIMAZE) injection 50 mcg (50 mcg Intravenous Given 06/16/18 1513)  fentaNYL (SUBLIMAZE) injection 50 mcg (50 mcg Intravenous Given 06/16/18 1740)  potassium chloride 10 mEq in 100 mL IVPB (10 mEq Intravenous New Bag/Given 06/16/18 1741)  iopamidol (ISOVUE-300) 61 % injection 100 mL (100 mLs Intravenous Contrast Given 06/16/18 1800)     Initial Impression / Assessment and Plan / ED Course  I have reviewed the triage vital signs and the nursing notes.  Pertinent labs & imaging results that were available during my care of the patient were reviewed by me and considered in my medical decision making (see chart for details).     Patient with severe rectal pain.  Has urinary retention secondary to large stool ball.  Not large amount for me to disimpact but does have large stool ball on CT along with some surrounding colitis likely from the stool.  Has required narcotics for pain medicine.  Had enemas done in the ER yesterday and is failed outpatient  management with magnesium citrate and other medicines.  At this point with a urinary retention I feel patient would benefit from admission to the hospital with a bowel protocol for her constipation and colitis.  Final Clinical Impressions(s) / ED Diagnoses   Final diagnoses:  Urinary retention  Constipation, unspecified constipation type  Colitis    ED Discharge Orders    None       Benjiman CorePickering, Geofrey Silliman, MD 06/16/18 1912

## 2018-06-17 DIAGNOSIS — R338 Other retention of urine: Secondary | ICD-10-CM

## 2018-06-17 DIAGNOSIS — F329 Major depressive disorder, single episode, unspecified: Secondary | ICD-10-CM | POA: Diagnosis not present

## 2018-06-17 DIAGNOSIS — K529 Noninfective gastroenteritis and colitis, unspecified: Secondary | ICD-10-CM

## 2018-06-17 DIAGNOSIS — K5641 Fecal impaction: Secondary | ICD-10-CM | POA: Diagnosis not present

## 2018-06-17 DIAGNOSIS — K5900 Constipation, unspecified: Secondary | ICD-10-CM

## 2018-06-17 DIAGNOSIS — E876 Hypokalemia: Secondary | ICD-10-CM

## 2018-06-17 DIAGNOSIS — F419 Anxiety disorder, unspecified: Secondary | ICD-10-CM | POA: Diagnosis not present

## 2018-06-17 LAB — COMPREHENSIVE METABOLIC PANEL
ALBUMIN: 3.3 g/dL — AB (ref 3.5–5.0)
ALK PHOS: 67 U/L (ref 38–126)
ALT: 14 U/L (ref 0–44)
ANION GAP: 8 (ref 5–15)
AST: 19 U/L (ref 15–41)
BILIRUBIN TOTAL: 0.6 mg/dL (ref 0.3–1.2)
BUN: 8 mg/dL (ref 6–20)
CALCIUM: 8 mg/dL — AB (ref 8.9–10.3)
CO2: 26 mmol/L (ref 22–32)
Chloride: 108 mmol/L (ref 98–111)
Creatinine, Ser: 0.66 mg/dL (ref 0.44–1.00)
GFR calc Af Amer: >60 mL/min (ref >60)
GFR calc non Af Amer: >60 mL/min (ref >60)
Glucose, Bld: 87 mg/dL (ref 70–99)
POTASSIUM: 3.6 mmol/L (ref 3.5–5.1)
SODIUM: 142 mmol/L (ref 135–145)
TOTAL PROTEIN: 6 g/dL — AB (ref 6.5–8.1)

## 2018-06-17 LAB — CBC WITH DIFFERENTIAL/PLATELET
BASOS ABS: 0 10*3/uL (ref 0.0–0.1)
BASOS PCT: 0 %
EOS ABS: 0.2 10*3/uL (ref 0.0–0.7)
Eosinophils Relative: 2 %
HEMATOCRIT: 37.5 % (ref 36.0–46.0)
HEMOGLOBIN: 13 g/dL (ref 12.0–15.0)
Lymphocytes Relative: 28 %
Lymphs Abs: 2.7 10*3/uL (ref 0.7–4.0)
MCH: 29.9 pg (ref 26.0–34.0)
MCHC: 34.7 g/dL (ref 30.0–36.0)
MCV: 86.2 fL (ref 78.0–100.0)
Monocytes Absolute: 0.7 10*3/uL (ref 0.1–1.0)
Monocytes Relative: 7 %
NEUTROS ABS: 6.1 10*3/uL (ref 1.7–7.7)
NEUTROS PCT: 63 %
Platelets: 230 10*3/uL (ref 150–400)
RBC: 4.35 MIL/uL (ref 3.87–5.11)
RDW: 13.3 % (ref 11.5–15.5)
WBC: 9.6 10*3/uL (ref 4.0–10.5)

## 2018-06-17 MED ORDER — DOCUSATE SODIUM 100 MG PO CAPS: 100.0000 mg | ORAL_CAPSULE | Freq: Every day | ORAL | Status: DC | PRN

## 2018-06-17 MED ORDER — MILK AND MOLASSES ENEMA
1.0000 | Freq: Once | RECTAL | Status: AC
  Administered 2018-06-17: 250 mL via RECTAL

## 2018-06-17 MED ORDER — DULOXETINE HCL 60 MG PO CPEP
60.0000 mg | ORAL_CAPSULE | Freq: Every day | ORAL | Status: DC
  Administered 2018-06-17 – 2018-06-18 (×2): 60 mg via ORAL
  Filled 2018-06-17 (×2): qty 1

## 2018-06-17 MED ORDER — BUSPIRONE HCL 5 MG PO TABS: 5.0000 mg | ORAL_TABLET | Freq: Two times a day (BID) | ORAL | Status: DC | PRN

## 2018-06-17 MED ORDER — MAGNESIUM SULFATE 2 GM/50ML IV SOLN
2.0000 g | Freq: Once | INTRAVENOUS | Status: AC
  Administered 2018-06-17: 2 g via INTRAVENOUS
  Filled 2018-06-17: qty 50

## 2018-06-17 MED ORDER — CYCLOBENZAPRINE HCL 10 MG PO TABS: 10.0000 mg | ORAL_TABLET | Freq: Three times a day (TID) | ORAL | Status: DC | PRN

## 2018-06-17 MED ORDER — GABAPENTIN 300 MG PO CAPS
1200.0000 mg | ORAL_CAPSULE | Freq: Three times a day (TID) | ORAL | Status: DC
  Administered 2018-06-17 – 2018-06-18 (×3): 1200 mg via ORAL
  Filled 2018-06-17 (×3): qty 4

## 2018-06-17 MED ORDER — NYSTATIN-TRIAMCINOLONE 100000-0.1 UNIT/GM-% EX CREA
TOPICAL_CREAM | Freq: Two times a day (BID) | CUTANEOUS | Status: DC
Start: 2018-06-17 — End: 2018-06-18
  Administered 2018-06-17 – 2018-06-18 (×2): via TOPICAL
  Filled 2018-06-17: qty 15

## 2018-06-17 MED ORDER — HYDROMORPHONE HCL 1 MG/ML IJ SOLN
1.0000 mg | Freq: Once | INTRAMUSCULAR | Status: AC
  Administered 2018-06-17: 1 mg via INTRAVENOUS
  Filled 2018-06-17: qty 1

## 2018-06-17 NOTE — Consult Note (Signed)
Referring Provider: Catarina Hartshorn, MD Primary Care Physician:  Carlean Jews, NP Primary Gastroenterologist:  Dr. Karilyn Cota  Reason for Consultation:   Fecal impaction and colitis  HPI:   Patient is a 37 year old Caucasian female who states she has been having problems with constipation since her C-section 2 years ago.  She states she has had liquid stools for years prior to becoming constipation and calls herself  a "squirter". For the last 2 weeks even though she has been passing liquid stools she has had sense of incomplete evacuation and she has felt constipated.  She has noted bloating fullness and hypogastric pain.  She was seen in emergency room 2 days ago.  She was given an enema and has some results.  She was also able to void.  She did pass some blood with a bowel movement.  She felt well enough to be discharged. She returns to the emergency room yesterday afternoon with complaints of difficulty in voiding anorectal pain passing liquid stool but still had sense of incomplete evacuation.  Abdominopelvic CT revealed distended rectum full of stool and diffuse symmetrical rectal wall thickening and perirectal inflammatory changes but no evidence of free air.  Lab studies were pertinent for mild leukocytosis and hypokalemia. Patient was admitted to hospitalist service and begun on IV fluids with KCl supplement.  Serum magnesium was normal. Rectal examination was attempted but resulted in extreme pain. She was given a smog enema which resulted in excruciating anorectal pain and she states she almost passed out. Dr. Onalee Hua Tat attempted rectal exam earlier today.  He did not notice any abnormality externally and patient declined to have digital exam.  At home she has been taking Colace.  She has not been taking any laxatives.  She has good appetite but she has been watching food intake in order to lose weight. She is presently not working.  She smokes half a pack of cigarettes per day.  She does not  drink alcohol.     Past Medical History:  Diagnosis Date  .  01/02/2017  . Anxiety and depression      .  Migraine 01/02/2017  . Hypertension   . Polycystic ovarian disease   . Psoriasis   . Rosacea     Past Surgical History:  Procedure Laterality Date  . CESAREAN SECTION N/A 04/24/2016   Procedure: CESAREAN SECTION;  Surgeon: Brock Bad, MD;  Location: [facility] BIRTHING SUITES;  Service: Obstetrics;  Laterality: N/A;  . CHOLECYSTECTOMY    . TONSILLECTOMY AND ADENOIDECTOMY      Prior to Admission medications   Medication Sig Start Date End Date Taking? Authorizing Provider  aspirin-acetaminophen-caffeine (EXCEDRIN MIGRAINE) [phone] MG tablet Take 2 tablets by mouth every 6 (six) hours as needed for headache or migraine.   Yes [provider]  busPIRone (BUSPAR) 10 MG tablet Take 1/2 to 1 tablet po BID prn anxiety Patient taking differently: Take 5-10 mg by mouth 2 (two) times daily as needed (for anxiety).  05/15/18  Yes Boscia, Kathlynn Grate, NP  cyclobenzaprine (FLEXERIL) 10 MG tablet Take 1 tablet (10 mg total) by mouth every 8 (eight) hours as needed for muscle spasms. Patient taking differently: Take 30 mg by mouth at bedtime.  06/13/16  Yes Denney, Rachelle A, CNM  docusate sodium (COLACE) 100 MG capsule Take 100 mg by mouth daily as needed for mild constipation.   Yes [provider]  DULoxetine (CYMBALTA) 60 MG capsule Take 1 capsule by mouth daily. 05/20/18  Yes [provider]  gabapentin (NEURONTIN) 600 MG tablet Take 2 tablets (1,200 mg total) by mouth 3 (three) times daily. Patient taking differently: Take 3,600 mg by mouth at bedtime.  06/13/16  Yes Denney, Rachelle A, CNM  hydrochlorothiazide (HYDRODIURIL) 25 MG tablet Take 1/2 to 1 tablet po QD prn edema Patient taking differently: Take 25 mg by mouth at bedtime.  05/15/18  Yes Carlean Jews, NP  magnesium citrate SOLN Take 1 Bottle by mouth once.   Yes [provider]  rizatriptan  (MAXALT-MLT) 10 MG disintegrating tablet Take 1 tablet (10 mg total) by mouth as needed for migraine. May repeat in 2 hours if needed 05/15/18  Yes Boscia, Kathlynn Grate, NP  diazepam (VALIUM) 5 MG tablet Take 1 tab po 1 hour prior  To procedure  And repeat 15 min prior  procedure Patient not taking: Reported on 06/15/2018 06/02/18   Carlean Jews, NP  phentermine (ADIPEX-P) 37.5 MG tablet Take 1 tablet by mouth daily.    [provider]    Current Facility-Administered Medications  Medication Dose Route Frequency Provider Last Rate Last Dose  . 0.9 % NaCl with KCl 40 mEq / L  infusion   Intravenous Continuous Bobette Mo, MD 125 mL/hr at 06/17/18 1613 125 mL/hr at 06/17/18 1613  . acetaminophen (TYLENOL) tablet 650 mg  650 mg Oral Q6H PRN Bobette Mo, MD   650 mg at 06/17/18 1610   Or  . acetaminophen (TYLENOL) suppository 650 mg  650 mg Rectal Q6H PRN Bobette Mo, MD      . busPIRone (BUSPAR) tablet 5-10 mg  5-10 mg Oral BID PRN Bobette Mo, MD      . cyclobenzaprine (FLEXERIL) tablet 10 mg  10 mg Oral Q8H PRN Bobette Mo, MD      . docusate sodium (COLACE) capsule 100 mg  100 mg Oral Daily PRN Bobette Mo, MD      . DULoxetine (CYMBALTA) DR capsule 60 mg  60 mg Oral Daily Bobette Mo, MD   60 mg at 06/17/18 1814  . enoxaparin (LOVENOX) injection 40 mg  40 mg Subcutaneous Q24H Bobette Mo, MD   40 mg at 06/16/18 2216  . gabapentin (NEURONTIN) capsule 1,200 mg  1,200 mg Oral TID Bobette Mo, MD      . HYDROmorphone (DILAUDID) injection 1 mg  1 mg Intravenous Once Iolani Twilley U, MD      . milk and molasses enema  1 enema Rectal Once Uzoma Vivona U, MD      . milk and molasses enema  1 enema Rectal Once Khristie Sak, Joline Maxcy, MD      . nystatin-triamcinolone (MYCOLOG II) cream   Topical BID Malissa Hippo, MD      . ondansetron (ZOFRAN) tablet 4 mg  4 mg Oral Q6H PRN Bobette Mo, MD       Or  . ondansetron  [facility]) injection 4 mg  4 mg Intravenous Q6H PRN Bobette Mo, MD   4 mg at 06/16/18 2217    Allergies as of 06/16/2018 - Review Complete 06/16/2018  Allergen Reaction Noted  . Codeine Nausea And Vomiting 02/20/2016  . Hydrocodone Itching 02/20/2016  . Metronidazole  06/15/2018  . Morphine and related Nausea And Vomiting 02/20/2016    Family History  Problem Relation Age of Onset  . Breast cancer Mother   . Bone cancer Mother   . Migraines Mother   . Colon cancer  Father   . Prostate cancer Father   . Brain cancer Maternal Grandmother   . Migraines Maternal Grandmother   . Migraines Sister     Social History   Socioeconomic History  . Marital status: Divorced    Spouse name: Not on file  . Number of children: 2  . Years of education: GED  . Highest education level: Not on file  Occupational History  . Occupation: N/A  Social Needs  . Financial resource strain: Not on file  . Food insecurity:    Worry: Not on file    Inability: Not on file  . Transportation needs:    Medical: Not on file    Non-medical: Not on file  Tobacco Use  . Smoking status: Current Some Day Smoker    Packs/day: 0.50    Types: Cigarettes  . Smokeless tobacco: Never Used  Substance and Sexual Activity  . Alcohol use: No  . Drug use: No  . Sexual activity: Yes    Birth control/protection: None  Lifestyle  . Physical activity:    Days per week: Not on file    Minutes per session: Not on file  . Stress: Not on file  Relationships  . Social connections:    Talks on phone: Not on file    Gets together: Not on file    Attends religious service: Not on file    Active member of club or organization: Not on file    Attends meetings of clubs or organizations: Not on file    Relationship status: Not on file  . Intimate partner violence:    Fear of current or ex partner: Not on file    Emotionally abused: Not on file    Physically abused: Not on file    Forced sexual activity: Not on  file  Other Topics Concern  . Not on file  Social History Narrative   Lives at home w/ her children   Right-handed   Caffeine: 1 cup coffee per day    Review of Systems: See HPI, otherwise normal ROS  Physical Exam: Temp:  [98.2 F (36.8 C)-98.9 F (37.2 C)] 98.2 F (36.8 C) (07/17 1308) Pulse Rate:  [70-87] 87 (07/17 2036) Resp:  [18-19] 18 (07/17 2036) BP: (112-135)/(75-87) 135/86 (07/17 2036) SpO2:  [100 %] 100 % (07/17 2139) Last BM Date: 06/17/18   Patient is alert and in no acute distress. Conjunctiva is pink.  Sclerae nonicteric. Oropharyngeal mucosa is normal. No neck masses or thyromegaly noted. Cardiac exam with regular rhythm normal S1 and S2.  No murmur gallop noted. Lungs are clear to auscultation. Abdomen is full.  Pfannenstiel scar noted.  Bowel sounds are normal.  On palpation abdomen is soft and nontender without organomegaly or masses. No peripheral edema or clubbing noted. Patient declined rectal examination.   Lab Results: Recent Labs    06/16/18 1440 06/17/18 0552  WBC 12.7* 9.6  HGB 14.6 13.0  HCT 41.1 37.5  PLT 249 230   BMET Recent Labs    06/16/18 1440 06/17/18 0552  NA 139 142  K 2.7* 3.6  CL 104 108  CO2 23 26  GLUCOSE 120* 87  BUN 8 8  CREATININE 0.85 0.66  CALCIUM 8.7* 8.0*   LFT Recent Labs    06/17/18 0552  PROT 6.0*  ALBUMIN 3.3*  AST 19  ALT 14  ALKPHOS 67  BILITOT 0.6   PT/INR No results for input(s): LABPROT, INR in the last 72 hours. Hepatitis Panel No  results for input(s): HEPBSAG, HCVAB, HEPAIGM, HEPBIGM in the last 72 hours.  Studies/Results: Ct Abdomen Pelvis W Contrast  Result Date: 06/16/2018 CLINICAL DATA:  Rectal pain, constipation and inability to urinate. EXAM: CT ABDOMEN AND PELVIS WITH CONTRAST TECHNIQUE: Multidetector CT imaging of the abdomen and pelvis was performed using the standard protocol following bolus administration of intravenous contrast. CONTRAST:  ISOVUE-300 IOPAMIDOL  (ISOVUE-300) INJECTION 61% COMPARISON:  None. FINDINGS: Lower chest: No acute abnormality. Hepatobiliary: No focal liver abnormality is seen. Status post cholecystectomy. No biliary dilatation. Pancreas: Unremarkable. No pancreatic ductal dilatation or surrounding inflammatory changes. Spleen: Normal in size without focal abnormality. Adrenals/Urinary Tract: Adrenal glands are unremarkable. Kidneys are normal, without renal calculi, focal lesion, or hydronephrosis. Bladder is decompressed by a Foley catheter. Stomach/Bowel: No evidence of bowel obstruction. There is some scattered fluid in small bowel and the colon. A large amount of fecal material occupies a distended rectum with stool ball measuring up to 7.5 cm in transverse diameter and approximately 11 cm in height. There is some associated inflammation in the perirectal fat without evidence of abscess or free fluid. No free air. Vascular/Lymphatic: No significant vascular findings are present. No enlarged abdominal or pelvic lymph nodes. Reproductive: Uterus and bilateral adnexa are unremarkable. Other: No hernias identified. Musculoskeletal: No acute or significant osseous findings. IMPRESSION: Fecal impaction with large stool ball distending the rectum. There is associated inflammation in the perirectal fat without abscess or evidence of perforation. Electronically Signed   By: Irish Lack M.D.   On: 06/16/2018 18:29   CT reviewed along with Dr. Tyron Russell.  Assessment;  Patient is a 37 year old Caucasian female who presents with fecal impaction.  She has inflammatory changes involving rectal vault felt to be physical injury secondary to fecal impaction.  Rest of the colonic wall appears to be unremarkable.  I wonder if she has underlying IBS with diarrhea and now she has gone on to the other extreme. She may have developed anal fissure because of straining.  I do not believe we are dealing with inflammatory bowel disease and Rectal inflammatory  changes should resolve as fecal impaction is treated.   Recommendations;  Mycolog to cream to be applied to perianal area twice daily. Hydromorphone 1 mg IV followed by milk of molasses enema this evening and again tonight. Further recommendations will be made depending on response to this intervention.   LOS: 0 days   Alok Minshall  06/17/2018, 10:20 PM

## 2018-06-17 NOTE — Progress Notes (Addendum)
PROGRESS NOTE  Andrea [name] ZOX:[id number] DOB: 22-Aug-1981 DOA: 06/16/2018 PCP: Carlean Jews, NP  Brief History:  37 year old female with a history of migraine headaches, anxiety/depression, polycystic ovarian disease presenting with rectal pain and constipation.  The patient states that she last had a bowel movement approximately 1 month prior to this admission.  The patient states that she only intermittently takes MiraLAX, Senokot, and other over-the-counter stool softeners when she feels constipated.  The patient visited the emergency department on 06/15/2018.  The patient received an enema resulting in some liquid stool.  Since the enema, the patient been complaining of worsening rectal pain.  As result, the patient represented to the hospital on 06/16/2018.  The patient continues to pass liquid stool.  CT of the abdomen and pelvis showed fecal impaction with a large stool ball measuring 7.5 x 11 cm distending the rectum.  There was associated inflammation and perirectal fat stranding.  A large amount of feces was noted to occupy and distended rectum.  GI was consulted to assist with management.  Assessment/Plan: Obstipation -CT abd--fecal impaction noted as above -GI consult -MiraLAX 3 times daily pending GI evaluation  Rectal pain -No visible fissures on physical examination  Urinary retention -Foley catheter placed in the emergency department 06/16/2018 -Keep Foley catheter for now  Anxiety/depression -Restart BuSpar, Cymbalta  Hypokalemia -replete -check mag--2.2     Disposition Plan:   Home when cleared by GI Family Communication:  Significant other updated at bedside--Total time spent 35 minutes.  Greater than 50% spent face to face counseling and coordinating care. 1445 to 1520   Consultants:  GI--Rehman  Code Status:  FULL   DVT Prophylaxis:  [place] Lovenox   Procedures: As Listed in Progress Note  Above  Antibiotics: None    Subjective: Patient complains of rectal pain.  She is passing liquid stool.  She denies any abdominal pain, chest pain, shortness breath, vomiting, fevers, chills, headache.  She has some nausea.  Objective: Vitals:   06/16/18 2018 06/16/18 2335 06/17/18 0700 06/17/18 1308  BP: 127/86 115/78 120/87 112/75  Pulse: 79 75 70 86  Resp: 18 18 18 19   Temp: 98.3 F (36.8 C)  98.9 F (37.2 C) 98.2 F (36.8 C)  TempSrc: Oral   Oral  SpO2: 100% 100% 100% 100%  Weight: 99.9 kg (220 lb 4.8 oz)     Height: 5\' 7"  (1.702 m)       Intake/Output Summary (Last 24 hours) at 06/17/2018 1524 Last data filed at 06/17/2018 0900 Gross per 24 hour  Intake 836.25 ml  Output 1 ml  Net 835.25 ml   Weight change:  Exam:   General:  Pt is alert, follows commands appropriately, not in acute distress  HEENT: No icterus, No thrush, No neck mass, [place]/AT  Cardiovascular: RRR, S1/S2, no rubs, no gallops  Respiratory: CTA bilaterally, no wheezing, no crackles, no rhonchi  Abdomen: Soft/+BS, non tender, non distended, no guarding  Extremities: No edema, No lymphangitis, No petechiae, No rashes, no synovitis   -External rectal exam without any visible fissures, erythema, bleeding.  Patient deferred internal rectal examination   Data Reviewed: I have personally reviewed following labs and imaging studies Basic Metabolic Panel: Recent Labs  Lab 06/16/18 1440 06/17/18 0552  NA 139 142  K 2.7* 3.6  CL 104 108  CO2 23 26  GLUCOSE 120* 87  BUN 8 8  CREATININE 0.85 0.66  CALCIUM 8.7* 8.0*  MG  2.2  --    Liver Function Tests: Recent Labs  Lab 06/17/18 0552  AST 19  ALT 14  ALKPHOS 67  BILITOT 0.6  PROT 6.0*  ALBUMIN 3.3*   No results for input(s): LIPASE, AMYLASE in the last 168 hours. No results for input(s): AMMONIA in the last 168 hours. Coagulation Profile: No results for input(s): INR, PROTIME in the last 168 hours. CBC: Recent Labs  Lab  06/16/18 1440 06/17/18 0552  WBC 12.7* 9.6  NEUTROABS 9.9* 6.1  HGB 14.6 13.0  HCT 41.1 37.5  MCV 85.1 86.2  PLT 249 230   Cardiac Enzymes: No results for input(s): CKTOTAL, CKMB, CKMBINDEX, TROPONINI in the last 168 hours. BNP: Invalid input(s): POCBNP CBG: No results for input(s): GLUCAP in the last 168 hours. HbA1C: No results for input(s): HGBA1C in the last 72 hours. Urine analysis:    Component Value Date/Time   COLORURINE YELLOW 06/16/2018 1510   APPEARANCEUR CLEAR 06/16/2018 1510   APPEARANCEUR Clear 04/29/2014 1217   LABSPEC 1.011 06/16/2018 1510   LABSPEC 1.029 04/29/2014 1217   PHURINE 6.0 06/16/2018 1510   GLUCOSEU NEGATIVE 06/16/2018 1510   GLUCOSEU Negative 04/29/2014 1217   HGBUR NEGATIVE 06/16/2018 1510   BILIRUBINUR NEGATIVE 06/16/2018 1510   BILIRUBINUR neg 05/15/2018 1132   BILIRUBINUR Negative 04/29/2014 1217   KETONESUR 5 (A) 06/16/2018 1510   PROTEINUR NEGATIVE 06/16/2018 1510   UROBILINOGEN 0.2 05/15/2018 1132   UROBILINOGEN 0.2 07/11/2012 0955   NITRITE NEGATIVE 06/16/2018 1510   LEUKOCYTESUR NEGATIVE 06/16/2018 1510   LEUKOCYTESUR Negative 04/29/2014 1217   Sepsis Labs: @LABRCNTIP (procalcitonin:4,lacticidven:4) )No results found for this or any previous visit (from the past 240 hour(s)).   Scheduled Meds: . enoxaparin (LOVENOX) injection  40 mg Subcutaneous Q24H   Continuous Infusions: . 0.9 % NaCl with KCl 40 mEq / L 125 mL/hr (06/17/18 0738)    Procedures/Studies: Mr Laqueta JeanBrain W Wo Contrast  Result Date: 06/03/2018 CLINICAL DATA:  37 year old female with chronic migraines. Lightheadedness and light sensitivity. Nonspecific cerebral white matter signal changes on 2017 brain MRI. Subsequent encounter. EXAM: MRI HEAD WITHOUT AND WITH CONTRAST TECHNIQUE: Multiplanar, multiecho pulse sequences of the brain and surrounding structures were obtained without and with intravenous contrast. CONTRAST:  20mL MULTIHANCE GADOBENATE DIMEGLUMINE 529 MG/ML  IV SOLN COMPARISON:  Cervical spine MRI 03/16/2017.  Brain MRI 11/23/2016. FINDINGS: Brain: Cerebral volume is stable and within normal limits. No restricted diffusion to suggest acute infarction. No midline shift, mass effect, evidence of mass lesion, ventriculomegaly, extra-axial collection or acute intracranial hemorrhage. Cervicomedullary junction and pituitary are within normal limits. Several small 6-8 millimeter foci of nonspecific cerebral white matter T2 and FLAIR hyperintensity are re-demonstrated, stable since 2017, and in a nonspecific configuration (series 12, image 31). No associated enhancement. Mild left temporal lobe white matter involvement as before. Small area of chronic involvement also in the left cerebellum. No new or enlarged white matter lesions. The corpus callosum appears normal. No superimposed cortical encephalomalacia or chronic cerebral blood products. Normal deep gray matter nuclei and brainstem. No abnormal enhancement identified. No dural thickening. Vascular: Major intracranial vascular flow voids are stable and appear normal. The major dural venous sinuses are enhancing and appear patent. Skull and upper cervical spine: Normal visible cervical spine. Visualized bone marrow signal is within normal limits. Sinuses/Orbits: Normal orbits soft tissues. Paranasal sinuses and mastoids are stable and well pneumatized. Other: Visible internal auditory structures appear normal. Scalp and face soft tissues appear negative. IMPRESSION: 1. No acute intracranial abnormality.  Stable MRI appearance of the brain since 2017. 2. Unchanged scattered (approximately 7) small nonspecific, nonenhancing T2/FLAIR hyperintense lesions in the cerebral white matter and also the left cerebellum. No new abnormality. Electronically Signed   By: Odessa Fleming M.D.   On: 06/03/2018 14:37   Ct Abdomen Pelvis W Contrast  Result Date: 06/16/2018 CLINICAL DATA:  Rectal pain, constipation and inability to urinate. EXAM:  CT ABDOMEN AND PELVIS WITH CONTRAST TECHNIQUE: Multidetector CT imaging of the abdomen and pelvis was performed using the standard protocol following bolus administration of intravenous contrast. CONTRAST:  ISOVUE-300 IOPAMIDOL (ISOVUE-300) INJECTION 61% COMPARISON:  None. FINDINGS: Lower chest: No acute abnormality. Hepatobiliary: No focal liver abnormality is seen. Status post cholecystectomy. No biliary dilatation. Pancreas: Unremarkable. No pancreatic ductal dilatation or surrounding inflammatory changes. Spleen: Normal in size without focal abnormality. Adrenals/Urinary Tract: Adrenal glands are unremarkable. Kidneys are normal, without renal calculi, focal lesion, or hydronephrosis. Bladder is decompressed by a Foley catheter. Stomach/Bowel: No evidence of bowel obstruction. There is some scattered fluid in small bowel and the colon. A large amount of fecal material occupies a distended rectum with stool ball measuring up to 7.5 cm in transverse diameter and approximately 11 cm in height. There is some associated inflammation in the perirectal fat without evidence of abscess or free fluid. No free air. Vascular/Lymphatic: No significant vascular findings are present. No enlarged abdominal or pelvic lymph nodes. Reproductive: Uterus and bilateral adnexa are unremarkable. Other: No hernias identified. Musculoskeletal: No acute or significant osseous findings. IMPRESSION: Fecal impaction with large stool ball distending the rectum. There is associated inflammation in the perirectal fat without abscess or evidence of perforation. Electronically Signed   By: Irish Lack M.D.   On: 06/16/2018 18:29    Catarina Hartshorn, DO  [facility] Pager [phone]  If 7PM-7AM, please contact night-coverage [email or web address] Password [email or web address] 06/17/2018, 3:24 PM   LOS: 0 days

## 2018-06-17 NOTE — H&P (Signed)
History and Physical    Andrea [name] WJX:[id number] DOB: 05/31/1981 DOA: 06/16/2018  PCP: Carlean Jews, NP   Patient coming from: Home.  I have personally briefly reviewed patient's old medical records in [facility]  Chief Complaint: Rectal pain and unable to urinate.  HPI: Andrea [name] is a 37 y.o. female with medical history significant of anxiety and depression, migraine headaches, hypertension, polycystic ovarian disease, psoriasis, rosacea who is coming for the second time in 2 days to the emergency department due to rectal pain, constipation and urinary retention.  She complains of nausea, but no emesis.  No melena or hematochezia.  No dysuria or hematuria.  She denies fever, chills, sore throat, dyspnea, chest pain, palpitations, diaphoresis, orthopnea, PND, but gets occasional pitting edema lower extremities.  Denies polyuria, polydipsia, polyphagia or blurred vision.  No heat or cold intolerance.  No pruritus or skin rashes.  History of anxiety with occasional insomnia.    ED Course: Initial vital signs temperature 98.8 F, pulse 145, respirations 20, blood pressure 149/100 and O2 sat 100% on room air.  She received IV fluids, potassium replacement, analgesics and antiemetics.  Her urinalysis showed small ketonuria, but no other abnormality.  White count was 12.7 with 78% neutrophils, 17% lymphocytes and 4% monocytes.  Hemoglobin 14.6 g/dL and platelets 213.  Sodium 139, potassium 2.7, chloride 104 and CO2 23 mmol/L.  Her BUN was 8, creatinine 0.85, glucose 120 and calcium 8.7 mg/dL.   06/15/2018 CT abdomen/pelvis with contrast shows fecal impaction with large stool ball distending the rectum.  There was associated inflammation in the perirectal fat without abscess or evidence of perforation.  Review of Systems: As per HPI otherwise 10 point review of systems negative.    Past Medical History:  Diagnosis Date  . Abnormal finding on MRI of brain 01/02/2017  . Anxiety  and depression    see progress notes -sw  . Common migraine with intractable migraine 01/02/2017  . Hypertension   . Polycystic ovarian disease   . Psoriasis   . Rosacea     Past Surgical History:  Procedure Laterality Date  . CESAREAN SECTION N/A 04/24/2016   Procedure: CESAREAN SECTION;  Surgeon: Brock Bad, MD;  Location: [facility] BIRTHING SUITES;  Service: Obstetrics;  Laterality: N/A;  . CHOLECYSTECTOMY    . TONSILLECTOMY AND ADENOIDECTOMY       reports that she has been smoking cigarettes.  She has been smoking about 0.50 packs per day. She has never used smokeless tobacco. She reports that she does not drink alcohol or use drugs.  Allergies  Allergen Reactions  . Codeine Nausea And Vomiting  . Hydrocodone Itching    itching  . Metronidazole     Patient is not aware of this allergy  . Morphine And Related Nausea And Vomiting    Family History  Problem Relation Age of Onset  . Breast cancer Mother   . Bone cancer Mother   . Migraines Mother   . Colon cancer Father   . Prostate cancer Father   . Brain cancer Maternal Grandmother   . Migraines Maternal Grandmother   . Migraines Sister     Prior to Admission medications   Medication Sig Start Date End Date Taking? Authorizing Provider  aspirin-acetaminophen-caffeine (EXCEDRIN MIGRAINE) [phone] MG tablet Take 2 tablets by mouth every 6 (six) hours as needed for headache or migraine.   Yes [provider]  busPIRone (BUSPAR) 10 MG tablet Take 1/2 to 1 tablet po BID prn  anxiety Patient taking differently: Take 5-10 mg by mouth 2 (two) times daily as needed (for anxiety).  05/15/18  Yes Boscia, Kathlynn Grate, NP  cyclobenzaprine (FLEXERIL) 10 MG tablet Take 1 tablet (10 mg total) by mouth every 8 (eight) hours as needed for muscle spasms. Patient taking differently: Take 30 mg by mouth at bedtime.  06/13/16  Yes Denney, Rachelle A, CNM  docusate sodium (COLACE) 100 MG capsule Take 100 mg by mouth daily as needed for  mild constipation.   Yes [provider]  DULoxetine (CYMBALTA) 60 MG capsule Take 1 capsule by mouth daily. 05/20/18  Yes [provider]  gabapentin (NEURONTIN) 600 MG tablet Take 2 tablets (1,200 mg total) by mouth 3 (three) times daily. Patient taking differently: Take 3,600 mg by mouth at bedtime.  06/13/16  Yes Denney, Rachelle A, CNM  hydrochlorothiazide (HYDRODIURIL) 25 MG tablet Take 1/2 to 1 tablet po QD prn edema Patient taking differently: Take 25 mg by mouth at bedtime.  05/15/18  Yes Carlean Jews, NP  magnesium citrate SOLN Take 1 Bottle by mouth once.   Yes [provider]  rizatriptan (MAXALT-MLT) 10 MG disintegrating tablet Take 1 tablet (10 mg total) by mouth as needed for migraine. May repeat in 2 hours if needed 05/15/18  Yes Boscia, Kathlynn Grate, NP  diazepam (VALIUM) 5 MG tablet Take 1 tab po 1 hour prior  To procedure  And repeat 15 min prior  procedure Patient not taking: Reported on 06/15/2018 06/02/18   Carlean Jews, NP  phentermine (ADIPEX-P) 37.5 MG tablet Take 1 tablet by mouth daily.    [provider]    Physical Exam: Vitals:   06/16/18 1900 06/16/18 1930 06/16/18 2018 06/16/18 2335  BP: 119/64 115/89 127/86 115/78  Pulse: 88 94 79 75  Resp: 17 18 18 18   Temp:   98.3 F (36.8 C)   TempSrc:   Oral   SpO2: 97% 98% 100% 100%  Weight:   99.9 kg (220 lb 4.8 oz)   Height:   5\' 7"  (1.702 m)     Constitutional: NAD, calm, comfortable Eyes: PERRL, lids and conjunctivae normal ENMT: Mucous membranes are moist. Posterior pharynx clear of any exudate or lesions. Neck: normal, supple, no masses, no thyromegaly Respiratory: clear to auscultation bilaterally, no wheezing, no crackles. Normal respiratory effort. No accessory muscle use.  Cardiovascular: Regular rate and rhythm, no murmurs / rubs / gallops. No extremity edema. 2+ pedal pulses. No carotid bruits.  Abdomen: Bowel sounds positive.Positive epigastric and LLQ tenderness,  no guarding/rebound/masses palpated. No hepatosplenomegaly.   Musculoskeletal: no clubbing / cyanosis. No joint deformity upper and lower extremities. Good ROM, no contractures. Normal muscle tone.  Skin: no rashes, lesions, ulcers on limited skin examination. Neurologic: CN 2-12 grossly intact. Sensation intact, DTR normal. Strength 5/5 in all 4.  Psychiatric: Normal judgment and insight. Alert and oriented x 3.  Mildly anxious mood.    Labs on Admission: I have personally reviewed following labs and imaging studies  CBC: Recent Labs  Lab 06/16/18 1440  WBC 12.7*  NEUTROABS 9.9*  HGB 14.6  HCT 41.1  MCV 85.1  PLT 249   Basic Metabolic Panel: Recent Labs  Lab 06/16/18 1440  NA 139  K 2.7*  CL 104  CO2 23  GLUCOSE 120*  BUN 8  CREATININE 0.85  CALCIUM 8.7*  MG 2.2   GFR: Estimated Creatinine Clearance: 112.1 mL/min (by C-G formula based on SCr of 0.85 mg/dL). Liver Function  Tests: No results for input(s): AST, ALT, ALKPHOS, BILITOT, PROT, ALBUMIN in the last 168 hours. No results for input(s): LIPASE, AMYLASE in the last 168 hours. No results for input(s): AMMONIA in the last 168 hours. Coagulation Profile: No results for input(s): INR, PROTIME in the last 168 hours. Cardiac Enzymes: No results for input(s): CKTOTAL, CKMB, CKMBINDEX, TROPONINI in the last 168 hours. BNP (last 3 results) No results for input(s): PROBNP in the last 8760 hours. HbA1C: No results for input(s): HGBA1C in the last 72 hours. CBG: No results for input(s): GLUCAP in the last 168 hours. Lipid Profile: No results for input(s): CHOL, HDL, LDLCALC, TRIG, CHOLHDL, LDLDIRECT in the last 72 hours. Thyroid Function Tests: No results for input(s): TSH, T4TOTAL, FREET4, T3FREE, THYROIDAB in the last 72 hours. Anemia Panel: No results for input(s): VITAMINB12, FOLATE, FERRITIN, TIBC, IRON, RETICCTPCT in the last 72 hours. Urine analysis:    Component Value Date/Time   COLORURINE YELLOW  06/16/2018 1510   APPEARANCEUR CLEAR 06/16/2018 1510   APPEARANCEUR Clear 04/29/2014 1217   LABSPEC 1.011 06/16/2018 1510   LABSPEC 1.029 04/29/2014 1217   PHURINE 6.0 06/16/2018 1510   GLUCOSEU NEGATIVE 06/16/2018 1510   GLUCOSEU Negative 04/29/2014 1217   HGBUR NEGATIVE 06/16/2018 1510   BILIRUBINUR NEGATIVE 06/16/2018 1510   BILIRUBINUR neg 05/15/2018 1132   BILIRUBINUR Negative 04/29/2014 1217   KETONESUR 5 (A) 06/16/2018 1510   PROTEINUR NEGATIVE 06/16/2018 1510   UROBILINOGEN 0.2 05/15/2018 1132   UROBILINOGEN 0.2 07/11/2012 0955   NITRITE NEGATIVE 06/16/2018 1510   LEUKOCYTESUR NEGATIVE 06/16/2018 1510   LEUKOCYTESUR Negative 04/29/2014 1217    Radiological Exams on Admission: Ct Abdomen Pelvis W Contrast  Result Date: 06/16/2018 CLINICAL DATA:  Rectal pain, constipation and inability to urinate. EXAM: CT ABDOMEN AND PELVIS WITH CONTRAST TECHNIQUE: Multidetector CT imaging of the abdomen and pelvis was performed using the standard protocol following bolus administration of intravenous contrast. CONTRAST:  ISOVUE-300 IOPAMIDOL (ISOVUE-300) INJECTION 61% COMPARISON:  None. FINDINGS: Lower chest: No acute abnormality. Hepatobiliary: No focal liver abnormality is seen. Status post cholecystectomy. No biliary dilatation. Pancreas: Unremarkable. No pancreatic ductal dilatation or surrounding inflammatory changes. Spleen: Normal in size without focal abnormality. Adrenals/Urinary Tract: Adrenal glands are unremarkable. Kidneys are normal, without renal calculi, focal lesion, or hydronephrosis. Bladder is decompressed by a Foley catheter. Stomach/Bowel: No evidence of bowel obstruction. There is some scattered fluid in small bowel and the colon. A large amount of fecal material occupies a distended rectum with stool ball measuring up to 7.5 cm in transverse diameter and approximately 11 cm in height. There is some associated inflammation in the perirectal fat without evidence of abscess  or free fluid. No free air. Vascular/Lymphatic: No significant vascular findings are present. No enlarged abdominal or pelvic lymph nodes. Reproductive: Uterus and bilateral adnexa are unremarkable. Other: No hernias identified. Musculoskeletal: No acute or significant osseous findings. IMPRESSION: Fecal impaction with large stool ball distending the rectum. There is associated inflammation in the perirectal fat without abscess or evidence of perforation. Electronically Signed   By: Irish Lack M.D.   On: 06/16/2018 18:29    EKG: Independently reviewed.    Assessment/Plan Principal Problem:   Colitis Secondary to constipation. No fever at this time. There is associated inflammation of the perirectal fat, but no abscess or perforation. Start antibiotics if the patient develops fever. Follow-up WBC in the morning.  Active Problems:   Anxiety and depression The patient had a panic attack while having enema in  her room. This was relieved with deep slow breathing and 1 mg of Ativan IVP. The patient takes 3600 mg of Neurontin and 30 mg of cyclobenzaprine at bedtime. She has not had it in 48 hours, so this may have been 1 of the reasons, along with hypokalemia and acute illness stress to trigger panic reaction.  The patient should take prescriptions as indicated Resume Cymbalta and buspirone in the morning. Resume enema in the morning.    Hypokalemia Seen. Follow-up potassium level in the morning.    Leukocytosis Follow-up CBC in a.m.    DVT prophylaxis: Lovenox. Code Status: Full code. Family Communication: Disposition Plan: IV hydration, electrolyte replacement and constipation relief. Consults called:  Admission status: Observation/telemetry.   Bobette Moavid Manuel Ortiz MD [facility] Pager [phone].  If 7PM-7AM, please contact night-coverage [email or web address] Password [email or web address]  06/17/2018, 2:48 AM

## 2018-06-17 NOTE — Progress Notes (Signed)
Patient given approximately a third of SMOG enema. Patient stated that she felt her stomach cramping and numbness to her face and arm. Patient also stated that it felt like her arms had locked up. Instructed patient to slowly deep breathe. Dr. Robb Matarrtiz notified and came to see the patient.  New orders placed and followed. Patient had a large liquid stool. Will continue to monitor.

## 2018-06-18 ENCOUNTER — Observation Stay (HOSPITAL_COMMUNITY): Payer: Medicaid Other

## 2018-06-18 DIAGNOSIS — I10 Essential (primary) hypertension: Secondary | ICD-10-CM | POA: Diagnosis present

## 2018-06-18 DIAGNOSIS — E282 Polycystic ovarian syndrome: Secondary | ICD-10-CM | POA: Diagnosis present

## 2018-06-18 DIAGNOSIS — Z79899 Other long term (current) drug therapy: Secondary | ICD-10-CM | POA: Diagnosis not present

## 2018-06-18 DIAGNOSIS — R338 Other retention of urine: Secondary | ICD-10-CM | POA: Diagnosis not present

## 2018-06-18 DIAGNOSIS — F419 Anxiety disorder, unspecified: Secondary | ICD-10-CM | POA: Diagnosis not present

## 2018-06-18 DIAGNOSIS — E876 Hypokalemia: Secondary | ICD-10-CM | POA: Diagnosis present

## 2018-06-18 DIAGNOSIS — R339 Retention of urine, unspecified: Secondary | ICD-10-CM | POA: Diagnosis present

## 2018-06-18 DIAGNOSIS — F329 Major depressive disorder, single episode, unspecified: Secondary | ICD-10-CM | POA: Diagnosis present

## 2018-06-18 DIAGNOSIS — K6289 Other specified diseases of anus and rectum: Secondary | ICD-10-CM | POA: Diagnosis present

## 2018-06-18 DIAGNOSIS — F410 Panic disorder [episodic paroxysmal anxiety] without agoraphobia: Secondary | ICD-10-CM | POA: Diagnosis present

## 2018-06-18 DIAGNOSIS — Z7982 Long term (current) use of aspirin: Secondary | ICD-10-CM | POA: Diagnosis not present

## 2018-06-18 DIAGNOSIS — K5641 Fecal impaction: Secondary | ICD-10-CM

## 2018-06-18 DIAGNOSIS — K51218 Ulcerative (chronic) proctitis with other complication: Secondary | ICD-10-CM | POA: Diagnosis not present

## 2018-06-18 DIAGNOSIS — K529 Noninfective gastroenteritis and colitis, unspecified: Secondary | ICD-10-CM | POA: Diagnosis present

## 2018-06-18 DIAGNOSIS — F17210 Nicotine dependence, cigarettes, uncomplicated: Secondary | ICD-10-CM | POA: Diagnosis present

## 2018-06-18 DIAGNOSIS — Z885 Allergy status to narcotic agent status: Secondary | ICD-10-CM | POA: Diagnosis not present

## 2018-06-18 DIAGNOSIS — K5900 Constipation, unspecified: Secondary | ICD-10-CM | POA: Diagnosis not present

## 2018-06-18 DIAGNOSIS — Z888 Allergy status to other drugs, medicaments and biological substances status: Secondary | ICD-10-CM | POA: Diagnosis not present

## 2018-06-18 LAB — BASIC METABOLIC PANEL
Anion gap: 6 (ref 5–15)
BUN: <5 mg/dL — ABNORMAL LOW (ref 6–20)
CALCIUM: 7.7 mg/dL — AB (ref 8.9–10.3)
CO2: 24 mmol/L (ref 22–32)
Chloride: 110 mmol/L (ref 98–111)
Creatinine, Ser: 0.63 mg/dL (ref 0.44–1.00)
Glucose, Bld: 80 mg/dL (ref 70–99)
POTASSIUM: 4 mmol/L (ref 3.5–5.1)
Sodium: 140 mmol/L (ref 135–145)

## 2018-06-18 LAB — HIV ANTIBODY (ROUTINE TESTING W REFLEX): HIV SCREEN 4TH GENERATION: NONREACTIVE

## 2018-06-18 LAB — MAGNESIUM: Magnesium: 2.5 mg/dL — ABNORMAL HIGH (ref 1.7–2.4)

## 2018-06-18 MED ORDER — POLYETHYLENE GLYCOL 3350 17 G PO PACK
17.0000 g | PACK | Freq: Every day | ORAL | Status: DC
  Administered 2018-06-18: 17 g via ORAL
  Filled 2018-06-18: qty 1

## 2018-06-18 MED ORDER — POLYETHYLENE GLYCOL 3350 17 G PO PACK: 17.0000 g | PACK | Freq: Every day | ORAL | 0 refills | Status: DC

## 2018-06-18 NOTE — Discharge Instructions (Signed)
Patient advised to increase intake of fiber rich foods. Should continue with miralax but can titrate the dose. She can use glycerin or Dulcolax suppository or fleets enema for constipation but should refrain from using stimulant laxatives such as Ex-Lax or senna. She is advised to keep stool diary until office visit in 4 to 5 weeks. Office will contact patient.

## 2018-06-18 NOTE — Discharge Summary (Signed)
Physician Discharge Summary  Andrea [name] UEA:[id number] DOB: 09/27/1981 DOA: 06/16/2018  PCP: Carlean Jews, NP  Admit date: 06/16/2018 Discharge date: 06/18/2018  Admitted From: Home Disposition:  Home   Recommendations for Outpatient Follow-up:  1. Follow up with PCP in 1-2 weeks 2. Please obtain BMP/CBC in one week    Discharge Condition: Stable CODE STATUS: FULL Diet recommendation:Regular   Brief/Interim Summary: 37 year old female with a history of migraine headaches, anxiety/depression, polycystic ovarian disease presenting with rectal pain and constipation.  The patient states that she last had a bowel movement approximately 1 month prior to this admission.  The patient states that she only intermittently takes MiraLAX, Senokot, and other over-the-counter stool softeners when she feels constipated.  The patient visited the emergency department on 06/15/2018.  The patient received an enema resulting in some liquid stool.  Since the enema, the patient been complaining of worsening rectal pain.  As result, the patient represented to the hospital on 06/16/2018.  The patient continues to pass liquid stool.  CT of the abdomen and pelvis showed fecal impaction with a large stool ball measuring 7.5 x 11 cm distending the rectum.  There was associated inflammation and perirectal fat stranding.  A large amount of feces was noted to occupy and distended rectum.  GI was consulted to assist with management.   Discharge Diagnoses:  Obstipation -CT abd--fecal impaction noted as above -GI consult-->milk and molasses enema with dilaudid pre-medication -pt subsequently had very large BM -06/18/18 KUB--resolved rectal stool burden -d/c home with miralax at least once daily, titrating to effect  Rectal pain -No visible fissures on physical examination -resolved with BM  Urinary retention -Foley catheter placed in the emergency department 06/16/2018 -Foley catheter removed-->pt able to  urinate spontaneously prior to d/c  Anxiety/depression -Restart BuSpar, Cymbalta  Hypokalemia -replete -check mag--2.2      Discharge Instructions   Allergies as of 06/18/2018      Reactions   Codeine Nausea And Vomiting   Hydrocodone Itching   itching   Metronidazole    Patient is not aware of this allergy   Morphine And Related Nausea And Vomiting      Medication List    STOP taking these medications   diazepam 5 MG tablet Commonly known as:  VALIUM   docusate sodium 100 MG capsule Commonly known as:  COLACE   magnesium citrate Soln     TAKE these medications   aspirin-acetaminophen-caffeine 250-250-65 MG tablet Commonly known as:  EXCEDRIN MIGRAINE Take 2 tablets by mouth every 6 (six) hours as needed for headache or migraine.   busPIRone 10 MG tablet Commonly known as:  BUSPAR Take 1/2 to 1 tablet po BID prn anxiety What changed:    how much to take  how to take this  when to take this  reasons to take this  additional instructions   cyclobenzaprine 10 MG tablet Commonly known as:  FLEXERIL Take 1 tablet (10 mg total) by mouth every 8 (eight) hours as needed for muscle spasms. What changed:    how much to take  when to take this   DULoxetine 60 MG capsule Commonly known as:  CYMBALTA Take 1 capsule by mouth daily.   gabapentin 600 MG tablet Commonly known as:  NEURONTIN Take 2 tablets (1,200 mg total) by mouth 3 (three) times daily. What changed:    how much to take  when to take this   hydrochlorothiazide 25 MG tablet Commonly known as:  HYDRODIURIL Take 1/2 to  1 tablet po QD prn edema What changed:    how much to take  how to take this  when to take this  additional instructions   phentermine 37.5 MG tablet Commonly known as:  ADIPEX-P Take 1 tablet by mouth daily.   polyethylene glycol packet Commonly known as:  MIRALAX / GLYCOLAX Take 17 g by mouth daily.   rizatriptan 10 MG disintegrating tablet Commonly  known as:  MAXALT-MLT Take 1 tablet (10 mg total) by mouth as needed for migraine. May repeat in 2 hours if needed       Allergies  Allergen Reactions  . Codeine Nausea And Vomiting  . Hydrocodone Itching    itching  . Metronidazole     Patient is not aware of this allergy  . Morphine And Related Nausea And Vomiting    Consultations:  GI--Rehman   Procedures/Studies: Dg Abd 1 View  Result Date: 06/18/2018 CLINICAL DATA:  37 year old with fecal impaction. EXAM: ABDOMEN - 1 VIEW COMPARISON:  CT 06/16/2018 FINDINGS: The large stool burden in the rectal region has decreased or resolved. There is a small amount of gas in rectal region. There is gas in the sigmoid colon and left colon. No significant stool burden in the abdomen. Nonobstructive bowel gas pattern. Cholecystectomy clips are present. No acute bone abnormality. IMPRESSION: Nonobstructive bowel gas pattern. The large stool burden near the rectum has resolved. Electronically Signed   By: Richarda Overlie M.D.   On: 06/18/2018 12:01   Mr Laqueta Jean ZO Contrast  Result Date: 06/03/2018 CLINICAL DATA:  37 year old female with chronic migraines. Lightheadedness and light sensitivity. Nonspecific cerebral white matter signal changes on 2017 brain MRI. Subsequent encounter. EXAM: MRI HEAD WITHOUT AND WITH CONTRAST TECHNIQUE: Multiplanar, multiecho pulse sequences of the brain and surrounding structures were obtained without and with intravenous contrast. CONTRAST:  20mL MULTIHANCE GADOBENATE DIMEGLUMINE 529 MG/ML IV SOLN COMPARISON:  Cervical spine MRI 03/16/2017.  Brain MRI 11/23/2016. FINDINGS: Brain: Cerebral volume is stable and within normal limits. No restricted diffusion to suggest acute infarction. No midline shift, mass effect, evidence of mass lesion, ventriculomegaly, extra-axial collection or acute intracranial hemorrhage. Cervicomedullary junction and pituitary are within normal limits. Several small 6-8 millimeter foci of nonspecific  cerebral white matter T2 and FLAIR hyperintensity are re-demonstrated, stable since 2017, and in a nonspecific configuration (series 12, image 31). No associated enhancement. Mild left temporal lobe white matter involvement as before. Small area of chronic involvement also in the left cerebellum. No new or enlarged white matter lesions. The corpus callosum appears normal. No superimposed cortical encephalomalacia or chronic cerebral blood products. Normal deep gray matter nuclei and brainstem. No abnormal enhancement identified. No dural thickening. Vascular: Major intracranial vascular flow voids are stable and appear normal. The major dural venous sinuses are enhancing and appear patent. Skull and upper cervical spine: Normal visible cervical spine. Visualized bone marrow signal is within normal limits. Sinuses/Orbits: Normal orbits soft tissues. Paranasal sinuses and mastoids are stable and well pneumatized. Other: Visible internal auditory structures appear normal. Scalp and face soft tissues appear negative. IMPRESSION: 1. No acute intracranial abnormality. Stable MRI appearance of the brain since 2017. 2. Unchanged scattered (approximately 7) small nonspecific, nonenhancing T2/FLAIR hyperintense lesions in the cerebral white matter and also the left cerebellum. No new abnormality. Electronically Signed   By: Odessa Fleming M.D.   On: 06/03/2018 14:37   Ct Abdomen Pelvis W Contrast  Result Date: 06/16/2018 CLINICAL DATA:  Rectal pain, constipation and inability to urinate.  EXAM: CT ABDOMEN AND PELVIS WITH CONTRAST TECHNIQUE: Multidetector CT imaging of the abdomen and pelvis was performed using the standard protocol following bolus administration of intravenous contrast. CONTRAST:  ISOVUE-300 IOPAMIDOL (ISOVUE-300) INJECTION 61% COMPARISON:  None. FINDINGS: Lower chest: No acute abnormality. Hepatobiliary: No focal liver abnormality is seen. Status post cholecystectomy. No biliary dilatation. Pancreas:  Unremarkable. No pancreatic ductal dilatation or surrounding inflammatory changes. Spleen: Normal in size without focal abnormality. Adrenals/Urinary Tract: Adrenal glands are unremarkable. Kidneys are normal, without renal calculi, focal lesion, or hydronephrosis. Bladder is decompressed by a Foley catheter. Stomach/Bowel: No evidence of bowel obstruction. There is some scattered fluid in small bowel and the colon. A large amount of fecal material occupies a distended rectum with stool ball measuring up to 7.5 cm in transverse diameter and approximately 11 cm in height. There is some associated inflammation in the perirectal fat without evidence of abscess or free fluid. No free air. Vascular/Lymphatic: No significant vascular findings are present. No enlarged abdominal or pelvic lymph nodes. Reproductive: Uterus and bilateral adnexa are unremarkable. Other: No hernias identified. Musculoskeletal: No acute or significant osseous findings. IMPRESSION: Fecal impaction with large stool ball distending the rectum. There is associated inflammation in the perirectal fat without abscess or evidence of perforation. Electronically Signed   By: Irish Lack M.D.   On: 06/16/2018 18:29         Discharge Exam: Vitals:   06/18/18 0500 06/18/18 1339  BP: 126/82 132/86  Pulse: 74 90  Resp: 18 19  Temp: 98.4 F (36.9 C) 98.3 F (36.8 C)  SpO2: 100% 100%   Vitals:   06/17/18 2139 06/17/18 2200 06/18/18 0500 06/18/18 1339  BP:  122/80 126/82 132/86  Pulse:  72 74 90  Resp:  18 18 19   Temp:  98 F (36.7 C) 98.4 F (36.9 C) 98.3 F (36.8 C)  TempSrc:  Oral Oral Oral  SpO2: 100% 100% 100% 100%  Weight:      Height:        General: Pt is alert, awake, not in acute distress Cardiovascular: RRR, S1/S2 +, no rubs, no gallops Respiratory: CTA bilaterally, no wheezing, no rhonchi Abdominal: Soft, NT, ND, bowel sounds + Extremities: no edema, no cyanosis   The results of significant diagnostics from  this hospitalization (including imaging, microbiology, ancillary and laboratory) are listed below for reference.    Significant Diagnostic Studies: Dg Abd 1 View  Result Date: 06/18/2018 CLINICAL DATA:  37 year old with fecal impaction. EXAM: ABDOMEN - 1 VIEW COMPARISON:  CT 06/16/2018 FINDINGS: The large stool burden in the rectal region has decreased or resolved. There is a small amount of gas in rectal region. There is gas in the sigmoid colon and left colon. No significant stool burden in the abdomen. Nonobstructive bowel gas pattern. Cholecystectomy clips are present. No acute bone abnormality. IMPRESSION: Nonobstructive bowel gas pattern. The large stool burden near the rectum has resolved. Electronically Signed   By: Richarda Overlie M.D.   On: 06/18/2018 12:01   Mr Laqueta Jean GN Contrast  Result Date: 06/03/2018 CLINICAL DATA:  37 year old female with chronic migraines. Lightheadedness and light sensitivity. Nonspecific cerebral white matter signal changes on 2017 brain MRI. Subsequent encounter. EXAM: MRI HEAD WITHOUT AND WITH CONTRAST TECHNIQUE: Multiplanar, multiecho pulse sequences of the brain and surrounding structures were obtained without and with intravenous contrast. CONTRAST:  20mL MULTIHANCE GADOBENATE DIMEGLUMINE 529 MG/ML IV SOLN COMPARISON:  Cervical spine MRI 03/16/2017.  Brain MRI 11/23/2016. FINDINGS: Brain: Cerebral volume  is stable and within normal limits. No restricted diffusion to suggest acute infarction. No midline shift, mass effect, evidence of mass lesion, ventriculomegaly, extra-axial collection or acute intracranial hemorrhage. Cervicomedullary junction and pituitary are within normal limits. Several small 6-8 millimeter foci of nonspecific cerebral white matter T2 and FLAIR hyperintensity are re-demonstrated, stable since 2017, and in a nonspecific configuration (series 12, image 31). No associated enhancement. Mild left temporal lobe white matter involvement as before. Small  area of chronic involvement also in the left cerebellum. No new or enlarged white matter lesions. The corpus callosum appears normal. No superimposed cortical encephalomalacia or chronic cerebral blood products. Normal deep gray matter nuclei and brainstem. No abnormal enhancement identified. No dural thickening. Vascular: Major intracranial vascular flow voids are stable and appear normal. The major dural venous sinuses are enhancing and appear patent. Skull and upper cervical spine: Normal visible cervical spine. Visualized bone marrow signal is within normal limits. Sinuses/Orbits: Normal orbits soft tissues. Paranasal sinuses and mastoids are stable and well pneumatized. Other: Visible internal auditory structures appear normal. Scalp and face soft tissues appear negative. IMPRESSION: 1. No acute intracranial abnormality. Stable MRI appearance of the brain since 2017. 2. Unchanged scattered (approximately 7) small nonspecific, nonenhancing T2/FLAIR hyperintense lesions in the cerebral white matter and also the left cerebellum. No new abnormality. Electronically Signed   By: Odessa FlemingH  Hall M.D.   On: 06/03/2018 14:37   Ct Abdomen Pelvis W Contrast  Result Date: 06/16/2018 CLINICAL DATA:  Rectal pain, constipation and inability to urinate. EXAM: CT ABDOMEN AND PELVIS WITH CONTRAST TECHNIQUE: Multidetector CT imaging of the abdomen and pelvis was performed using the standard protocol following bolus administration of intravenous contrast. CONTRAST:  100mL ISOVUE-300 IOPAMIDOL (ISOVUE-300) INJECTION 61% COMPARISON:  None. FINDINGS: Lower chest: No acute abnormality. Hepatobiliary: No focal liver abnormality is seen. Status post cholecystectomy. No biliary dilatation. Pancreas: Unremarkable. No pancreatic ductal dilatation or surrounding inflammatory changes. Spleen: Normal in size without focal abnormality. Adrenals/Urinary Tract: Adrenal glands are unremarkable. Kidneys are normal, without renal calculi, focal lesion,  or hydronephrosis. Bladder is decompressed by a Foley catheter. Stomach/Bowel: No evidence of bowel obstruction. There is some scattered fluid in small bowel and the colon. A large amount of fecal material occupies a distended rectum with stool ball measuring up to 7.5 cm in transverse diameter and approximately 11 cm in height. There is some associated inflammation in the perirectal fat without evidence of abscess or free fluid. No free air. Vascular/Lymphatic: No significant vascular findings are present. No enlarged abdominal or pelvic lymph nodes. Reproductive: Uterus and bilateral adnexa are unremarkable. Other: No hernias identified. Musculoskeletal: No acute or significant osseous findings. IMPRESSION: Fecal impaction with large stool ball distending the rectum. There is associated inflammation in the perirectal fat without abscess or evidence of perforation. Electronically Signed   By: Irish LackGlenn  Yamagata M.D.   On: 06/16/2018 18:29     Microbiology: No results found for this or any previous visit (from the past 240 hour(s)).   Labs: Basic Metabolic Panel: Recent Labs  Lab 06/16/18 1440 06/17/18 0552 06/18/18 0604  NA 139 142 140  K 2.7* 3.6 4.0  CL 104 108 110  CO2 23 26 24   GLUCOSE 120* 87 80  BUN 8 8 <5*  CREATININE 0.85 0.66 0.63  CALCIUM 8.7* 8.0* 7.7*  MG 2.2  --  2.5*   Liver Function Tests: Recent Labs  Lab 06/17/18 0552  AST 19  ALT 14  ALKPHOS 67  BILITOT 0.6  PROT 6.0*  ALBUMIN 3.3*   No results for input(s): LIPASE, AMYLASE in the last 168 hours. No results for input(s): AMMONIA in the last 168 hours. CBC: Recent Labs  Lab 06/16/18 1440 06/17/18 0552  WBC 12.7* 9.6  NEUTROABS 9.9* 6.1  HGB 14.6 13.0  HCT 41.1 37.5  MCV 85.1 86.2  PLT 249 230   Cardiac Enzymes: No results for input(s): CKTOTAL, CKMB, CKMBINDEX, TROPONINI in the last 168 hours. BNP: Invalid input(s): POCBNP CBG: No results for input(s): GLUCAP in the last 168 hours.  Time  coordinating discharge:  36 minutes  Signed:  Catarina Hartshorn, DO [facility] Pager: [phone] 06/18/2018, 4:04 PM

## 2018-06-18 NOTE — Progress Notes (Signed)
Discharge instructions gone over with patient, verbalized understanding. IV's removed, patient tolerated procedure well.  

## 2018-06-18 NOTE — Progress Notes (Signed)
  Subjective:  Patient has no complaints.  She says she feels 100% better.  She showed me a picture that she had taken when she had her bowel movement after receiving first milk of molasses enema.  Is a large amount of stool.  She denies anorectal pain or bleeding.  She also denies abdominal pain or nausea.  Objective: Blood pressure 132/86, pulse 90, temperature 98.3 F (36.8 C), temperature source Oral, resp. rate 19, height 5\' 7"  (1.702 m), weight 220 lb 4.8 oz (99.9 kg), last menstrual period 06/17/2018, SpO2 100 %, not currently breastfeeding. Patient is alert and in no acute distress. Abdomen is full but soft and nontender without organomegaly or masses. Labs/studies Results:  Recent Labs    06/16/18 1440 06/17/18 0552  WBC 12.7* 9.6  HGB 14.6 13.0  HCT 41.1 37.5  PLT 249 230    BMET  Recent Labs    06/16/18 1440 06/17/18 0552 06/18/18 0604  NA 139 142 140  K 2.7* 3.6 4.0  CL 104 108 110  CO2 23 26 24   GLUCOSE 120* 87 80  BUN 8 8 <5*  CREATININE 0.85 0.66 0.63  CALCIUM 8.7* 8.0* 7.7*    LFT  Recent Labs    06/17/18 0552  PROT 6.0*  ALBUMIN 3.3*  AST 19  ALT 14  ALKPHOS 67  BILITOT 0.6    Plain abdominal film reveals air in nondistended colon and air-fluid level in the rectum but no stool noted as on previous study.  Assessment:  #1.  Fecal impaction has resolved.  She does not have anorectal pain or bleeding anymore. #2.  Stercoral proctitis secondary to fecal impaction.  Rectal inflammation should resolve in about a few days.  She does not need any therapy at this time.   Recommendations:  Patient advised to increase intake of fiber rich foods. Should continue with polyethylene glycol but can titrate the dose. He can use glycerin or Dulcolax suppository or fleets enema for constipation but should refrain from using stimulant laxatives such as Ex-Lax or senna. She is advised to keep stool diary until office visit in 4 to 5 weeks. Office will contact  patient.

## 2018-07-09 ENCOUNTER — Encounter: Payer: Self-pay | Admitting: Nurse Practitioner

## 2018-07-09 ENCOUNTER — Ambulatory Visit: Payer: Medicaid Other | Admitting: Nurse Practitioner

## 2018-07-09 ENCOUNTER — Other Ambulatory Visit: Payer: Self-pay | Admitting: Nurse Practitioner

## 2018-07-09 VITALS — BP 128/80 | HR 100 | Resp 16 | Ht 68.0 in | Wt 213.8 lb

## 2018-07-09 DIAGNOSIS — F321 Major depressive disorder, single episode, moderate: Secondary | ICD-10-CM | POA: Diagnosis not present

## 2018-07-09 DIAGNOSIS — F411 Generalized anxiety disorder: Secondary | ICD-10-CM

## 2018-07-09 DIAGNOSIS — R9082 White matter disease, unspecified: Secondary | ICD-10-CM

## 2018-07-09 DIAGNOSIS — B373 Candidiasis of vulva and vagina: Secondary | ICD-10-CM

## 2018-07-09 DIAGNOSIS — K5909 Other constipation: Secondary | ICD-10-CM

## 2018-07-09 DIAGNOSIS — E876 Hypokalemia: Secondary | ICD-10-CM

## 2018-07-09 DIAGNOSIS — B3731 Acute candidiasis of vulva and vagina: Secondary | ICD-10-CM

## 2018-07-09 DIAGNOSIS — R5383 Other fatigue: Secondary | ICD-10-CM

## 2018-07-09 MED ORDER — BUSPIRONE HCL 15 MG PO TABS: 15.0000 mg | ORAL_TABLET | Freq: Three times a day (TID) | ORAL | 3 refills | Status: DC

## 2018-07-09 MED ORDER — DULOXETINE HCL 60 MG PO CPEP: 60.0000 mg | ORAL_CAPSULE | Freq: Two times a day (BID) | ORAL | 3 refills | Status: DC

## 2018-07-09 MED ORDER — POLYETHYLENE GLYCOL 3350 17 GM/SCOOP PO POWD: 17.0000 g | Freq: Every day | ORAL | 1 refills | Status: DC

## 2018-07-09 MED ORDER — FLUCONAZOLE 150 MG PO TABS: ORAL_TABLET | ORAL | 0 refills | Status: DC

## 2018-07-09 NOTE — Progress Notes (Signed)
[facility] [address] [place], [place] [address]  Internal MEDICINE  Office Visit Note  Patient Name: Andrea [name]  [id number]  [id number]  Date of Service: 07/20/2018  Chief Complaint  Patient presents with  . Labs Only    review MRI  . Fecal Impaction    hospital follow up colon blockage, will see GI physician next week. pt stated thatthe GI doctor, Dr. Gerilyn Nestle, will be in [place]    The patient was recently hospitalized due to significant blockage in her colon. Was 11cm in diameter.  Had to have "mild of molasses" enema in order to clear the blockage. Today, she admits she is feeling much better, but very tired.  Since her last visit, she has had MRI of the brain. White matte lesions are present but stable.      Current Medication: Outpatient Encounter Medications as of 07/09/2018  Medication Sig Note  . aspirin-acetaminophen-caffeine (EXCEDRIN MIGRAINE) 250-250-65 MG tablet Take 2 tablets by mouth every 6 (six) hours as needed for headache or migraine.   . busPIRone (BUSPAR) 15 MG tablet Take 1 tablet (15 mg total) by mouth 3 (three) times daily.   . cyclobenzaprine (FLEXERIL) 10 MG tablet Take 1 tablet (10 mg total) by mouth every 8 (eight) hours as needed for muscle spasms. (Patient taking differently: Take 30 mg by mouth at bedtime. )   . DULoxetine (CYMBALTA) 60 MG capsule Take 1 capsule (60 mg total) by mouth 2 (two) times daily.   Marland Kitchen gabapentin (NEURONTIN) 600 MG tablet Take 2 tablets (1,200 mg total) by mouth 3 (three) times daily. (Patient taking differently: Take 3,600 mg by mouth at bedtime. )   . hydrochlorothiazide (HYDRODIURIL) 25 MG tablet Take 1/2 to 1 tablet po QD prn edema (Patient taking differently: Take 25 mg by mouth at bedtime. )   . phentermine (ADIPEX-P) 37.5 MG tablet Take 1 tablet by mouth daily. 06/15/2018: Has on hand but has not started back taking  . rizatriptan (MAXALT-MLT) 10 MG disintegrating tablet Take 1 tablet (10 mg total) by  mouth as needed for migraine. May repeat in 2 hours if needed   . [DISCONTINUED] busPIRone (BUSPAR) 10 MG tablet Take 1/2 to 1 tablet po BID prn anxiety (Patient taking differently: Take 5-10 mg by mouth 2 (two) times daily as needed (for anxiety). )   . [DISCONTINUED] DULoxetine (CYMBALTA) 60 MG capsule Take 1 capsule by mouth daily.   . [DISCONTINUED] polyethylene glycol (MIRALAX / GLYCOLAX) packet Take 17 g by mouth daily.   . fluconazole (DIFLUCAN) 150 MG tablet Take 1 tablet po once. May repeat dose in 3 days as needed for persistent symptoms.   . polyethylene glycol powder (GLYCOLAX/MIRALAX) powder Take 17 g by mouth daily.    No facility-administered encounter medications on file as of 07/09/2018.     Surgical History: Past Surgical History:  Procedure Laterality Date  . CESAREAN SECTION N/A 04/24/2016   Procedure: CESAREAN SECTION;  Surgeon: Brock Bad, MD;  Location: [facility] BIRTHING SUITES;  Service: Obstetrics;  Laterality: N/A;  . CHOLECYSTECTOMY    . TONSILLECTOMY AND ADENOIDECTOMY      Medical History: Past Medical History:  Diagnosis Date  . Abnormal finding on MRI of brain 01/02/2017  . Anxiety and depression    see progress notes -sw  . Common migraine with intractable migraine 01/02/2017  . Hypertension   . Polycystic ovarian disease   . Psoriasis   . Rosacea     Family History: Family History  Problem Relation Age of Onset  . Breast cancer Mother   . Bone cancer Mother   . Migraines Mother   . Colon cancer Father   . Prostate cancer Father   . Brain cancer Maternal Grandmother   . Migraines Maternal Grandmother   . Breast cancer Maternal Grandmother   . Migraines Sister     Social History   Socioeconomic History  . Marital status: Divorced    Spouse name: Not on file  . Number of children: 2  . Years of education: GED  . Highest education level: Not on file  Occupational History  . Occupation: N/A  Social Needs  . Financial resource strain: Not on  file  . Food insecurity:    Worry: Not on file    Inability: Not on file  . Transportation needs:    Medical: Not on file    Non-medical: Not on file  Tobacco Use  . Smoking status: Current Some Day Smoker    Packs/day: 0.50    Types: Cigarettes  . Smokeless tobacco: Never Used  Substance and Sexual Activity  . Alcohol use: No  . Drug use: No  . Sexual activity: Yes    Birth control/protection: None  Lifestyle  . Physical activity:    Days per week: Not on file    Minutes per session: Not on file  . Stress: Not on file  Relationships  . Social connections:    Talks on phone: Not on file    Gets together: Not on file    Attends religious service: Not on file    Active member of club or organization: Not on file    Attends meetings of clubs or organizations: Not on file    Relationship status: Not on file  . Intimate partner violence:    Fear of current or ex partner: Not on file    Emotionally abused: Not on file    Physically abused: Not on file    Forced sexual activity: Not on file  Other Topics Concern  . Not on file  Social History Narrative   Lives at home w/ her children   Right-handed   Caffeine: 1 cup coffee per day      Review of Systems  Constitutional: Positive for activity change and fatigue. Negative for chills, fever and unexpected weight change.  HENT: Negative for congestion, postnasal drip, rhinorrhea, sinus pressure, sinus pain, sneezing and sore throat.   Eyes: Negative.  Negative for redness.  Respiratory: Negative for cough, chest tightness, shortness of breath and wheezing.   Cardiovascular: Negative for chest pain and palpitations.  Gastrointestinal: Positive for constipation and nausea. Negative for abdominal pain, diarrhea and vomiting.       Severe constipation. Recent hospitalization for bowel obstruction with stool.   Endocrine: Negative for cold intolerance, polydipsia, polyphagia and polyuria.  Genitourinary: Negative for dysuria,  flank pain, frequency, pelvic pain, urgency and vaginal discharge.  Musculoskeletal: Positive for arthralgias. Negative for back pain, joint swelling and neck pain.  Skin: Negative for rash.  Allergic/Immunologic: Negative for environmental allergies.  Neurological: Positive for dizziness, weakness and headaches. Negative for tremors and numbness.  Hematological: Negative for adenopathy. Does not bruise/bleed easily.  Psychiatric/Behavioral: Positive for dysphoric mood. Negative for behavioral problems (Depression), sleep disturbance and suicidal ideas. The patient is nervous/anxious.     Today's Vitals   07/09/18 1159  BP: 128/80  Pulse: 100  Resp: 16  SpO2: 98%  Weight: 213 lb 12.8 oz (97 kg)  Height:  5\' 8"  (1.727 m)   Physical Exam  Constitutional: She is oriented to person, place, and time. She appears well-developed and well-nourished. No distress.  HENT:  Head: Normocephalic and atraumatic.  Nose: Nose normal.  Mouth/Throat: Oropharynx is clear and moist. No oropharyngeal exudate.  Eyes: Pupils are equal, round, and reactive to light. Conjunctivae and EOM are normal.  Neck: Normal range of motion. Neck supple. No JVD present. No tracheal deviation present. No thyromegaly present.  Cardiovascular: Normal rate, regular rhythm and normal heart sounds. Exam reveals no gallop and no friction rub.  No murmur heard. Pulmonary/Chest: Effort normal and breath sounds normal. No respiratory distress. She has no wheezes. She has no rales. She exhibits no tenderness.  Abdominal: Soft. Bowel sounds are normal. There is tenderness.  Musculoskeletal: Normal range of motion.  Lymphadenopathy:    She has no cervical adenopathy.  Neurological: She is alert and oriented to person, place, and time. No cranial nerve deficit or sensory deficit.  Skin: Skin is warm and dry. Capillary refill takes less than 2 seconds. She is not diaphoretic.  Psychiatric: Her speech is normal and behavior is normal.  Judgment and thought content normal. Her mood appears anxious. Cognition and memory are normal. She exhibits a depressed mood.  Nursing note and vitals reviewed.  Assessment/Plan:  1. Other constipation Patient recently hospitalized with bowel obstruction. Improved now. Recommend she continue to use OTC miralax everyday. Increase fluid intake. May also consider taking stool softerns.  - polyethylene glycol powder (GLYCOLAX/MIRALAX) powder; Take 17 g by mouth daily.  Dispense: 3350 g; Refill: 1  2. White matter 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 Reviewed MRI of the brain. This continues to show white matter abnormalities which are stable since last exam. Recommend follow up with neurology.  3. Episode of moderate major depression (HCC) Increase duloxetine 60mg  to twice daily.  - DULoxetine (CYMBALTA) 60 MG capsule; Take 1 capsule (60 mg total) by mouth 2 (two) times daily.  Dispense: 60 capsule; Refill: 3  4. Vaginal candidiasis - fluconazole (DIFLUCAN) 150 MG tablet; Take 1 tablet po once. May repeat dose in 3 days as needed for persistent symptoms.  Dispense: 3 tablet; Refill: 0  5. Generalized anxiety disorder Add buspirone 15mg  which may be taken up to three times daily as needed for acute anxiety.  - busPIRone (BUSPAR) 15 MG tablet; Take 1 tablet (15 mg total) by mouth 3 (three) times daily.  Dispense: 90 tablet; Refill: 3  6. Hypokalemia - Basic Metabolic Panel (BMET)  7. Fatigue, unspecified type - Basic Metabolic Panel (BMET) - CBC w/Diff/Platelet   General Counseling: Kerie verbalizes understanding of the findings of todays visit and agrees with plan of treatment. I have discussed any further diagnostic evaluation that may be needed or ordered today. We also reviewed her medications today. she has been encouraged to call the office with any questions or concerns that should arise related to todays visit.  The 'BRAT' diet is suggested, then progress to diet as tolerated as symptoms  abate. Call if bloody stools, persistent diarrhea, vomiting, fever or abdominal pain.  This patient was seen by Vincent Gros FNP Collaboration with Dr Lyndon Code as a part of collaborative care agreement   Orders Placed This Encounter  Procedures  . Basic Metabolic Panel (BMET)  . CBC w/Diff/Platelet    Meds ordered this encounter  Medications  . fluconazole (DIFLUCAN) 150 MG tablet    Sig: Take 1 tablet po once. May repeat dose in 3 days  as needed for persistent symptoms.    Dispense:  3 tablet    Refill:  0    Order Specific Question:   Supervising Provider    Answer:   Lyndon Code [[id number]]  . DULoxetine (CYMBALTA) 60 MG capsule    Sig: Take 1 capsule (60 mg total) by mouth 2 (two) times daily.    Dispense:  60 capsule    Refill:  3    Please note increase in dosing.    Order Specific Question:   Supervising Provider    Answer:   Lyndon Code [[id number]]  . busPIRone (BUSPAR) 15 MG tablet    Sig: Take 1 tablet (15 mg total) by mouth 3 (three) times daily.    Dispense:  90 tablet    Refill:  3    Please note increased dose    Order Specific Question:   Supervising Provider    Answer:   Lyndon Code [[id number]]  . polyethylene glycol powder (GLYCOLAX/MIRALAX) powder    Sig: Take 17 g by mouth daily.    Dispense:  3350 g    Refill:  1    Order Specific Question:   Supervising Provider    Answer:   Lyndon Code [[id number]]    Time spent: 23 Minutes      Dr Lyndon Code Internal medicine

## 2018-07-10 ENCOUNTER — Encounter (HOSPITAL_COMMUNITY): Payer: Self-pay

## 2018-07-10 ENCOUNTER — Ambulatory Visit (HOSPITAL_COMMUNITY)
Admission: RE | Admit: 2018-07-10 | Discharge: 2018-07-10 | Disposition: A | Discharge status: Home / Self Care | Payer: Medicaid Other | Source: Ambulatory Visit | Attending: Nurse Practitioner | Admitting: Nurse Practitioner

## 2018-07-10 DIAGNOSIS — Z803 Family history of malignant neoplasm of breast: Secondary | ICD-10-CM

## 2018-07-10 DIAGNOSIS — Z1231 Encounter for screening mammogram for malignant neoplasm of breast: Secondary | ICD-10-CM | POA: Diagnosis not present

## 2018-07-10 DIAGNOSIS — Z1239 Encounter for other screening for malignant neoplasm of breast: Secondary | ICD-10-CM

## 2018-07-10 LAB — BASIC METABOLIC PANEL
BUN/Creatinine Ratio: 12 (ref 9–23)
BUN: 7 mg/dL (ref 6–20)
CHLORIDE: 108 mmol/L — AB (ref 96–106)
CO2: 24 mmol/L (ref 20–29)
Calcium: 9.4 mg/dL (ref 8.7–10.2)
Creatinine, Ser: 0.57 mg/dL (ref 0.57–1.00)
GFR calc Af Amer: 138 mL/min/{1.73_m2} (ref >59)
GFR calc non Af Amer: 120 mL/min/{1.73_m2} (ref >59)
GLUCOSE: 92 mg/dL (ref 65–99)
POTASSIUM: 4.2 mmol/L (ref 3.5–5.2)
SODIUM: 145 mmol/L — AB (ref 134–144)

## 2018-07-10 LAB — CBC
Hematocrit: 42 % (ref 34.0–46.6)
Hemoglobin: 14.3 g/dL (ref 11.1–15.9)
MCH: 29.8 pg (ref 26.6–33.0)
MCHC: 34 g/dL (ref 31.5–35.7)
MCV: 88 fL (ref 79–97)
PLATELETS: 305 10*3/uL (ref 150–450)
RBC: 4.8 x10E6/uL (ref 3.77–5.28)
RDW: 13.6 % (ref 12.3–15.4)
WBC: 10.6 10*3/uL (ref 3.4–10.8)

## 2018-07-20 DIAGNOSIS — F321 Major depressive disorder, single episode, moderate: Secondary | ICD-10-CM | POA: Insufficient documentation

## 2018-07-20 DIAGNOSIS — R5383 Other fatigue: Secondary | ICD-10-CM | POA: Insufficient documentation

## 2018-08-11 ENCOUNTER — Ambulatory Visit (INDEPENDENT_AMBULATORY_CARE_PROVIDER_SITE_OTHER): Payer: Medicaid Other | Admitting: Internal Medicine

## 2018-08-11 ENCOUNTER — Encounter (INDEPENDENT_AMBULATORY_CARE_PROVIDER_SITE_OTHER): Payer: Self-pay | Admitting: Internal Medicine

## 2018-08-11 ENCOUNTER — Telehealth (INDEPENDENT_AMBULATORY_CARE_PROVIDER_SITE_OTHER): Payer: Self-pay | Admitting: *Deleted

## 2018-08-11 ENCOUNTER — Encounter (INDEPENDENT_AMBULATORY_CARE_PROVIDER_SITE_OTHER): Payer: Self-pay | Admitting: *Deleted

## 2018-08-11 ENCOUNTER — Other Ambulatory Visit (INDEPENDENT_AMBULATORY_CARE_PROVIDER_SITE_OTHER): Payer: Self-pay | Admitting: Internal Medicine

## 2018-08-11 VITALS — BP 118/80 | HR 72 | Temp 97.9°F | Resp 18 | Ht 67.0 in | Wt 218.2 lb

## 2018-08-11 DIAGNOSIS — K5909 Other constipation: Secondary | ICD-10-CM | POA: Diagnosis not present

## 2018-08-11 DIAGNOSIS — Z800 Family history of malignant neoplasm of digestive organs: Secondary | ICD-10-CM

## 2018-08-11 MED ORDER — SUPREP BOWEL PREP KIT 17.5-3.13-1.6 GM/177ML PO SOLN: 1.0000 | Freq: Once | ORAL | 0 refills | Status: AC

## 2018-08-11 NOTE — Telephone Encounter (Signed)
Patient needs suprep 

## 2018-08-11 NOTE — Progress Notes (Signed)
Presenting complaint;  Follow-up for constipation.  Database and subjective:  Patient is 37 year old Caucasian female who has over 2-year history of constipation who was hospitalized for fecal impaction on 06/16/2018.  She responded to milk of molasses enemas.  CT showed rectal wall thickening and inflammation which was felt to be due to fecal impaction. Patient was advised to take polyethylene glycol daily and increase consumption of fiber rich foods.  Patient feels much better.  She is having 4-5 loose stools every day.  She skipped MiraLAX for a day or so and she became constipated.  She has not passed any blood per rectum in the last 5 months.  She says she has been passing blood off and on since she was a teenager.  Rectal burning has resolved.  She has changed her eating habits.  She eats [employer] every day and she also eats one apple every day.  She is trying to lose weight.  She has lost few pounds since her hospitalization. She states she weighed 315 pounds in 2016 and started to go to gym 6 days a week and lost down to 150 pounds.  Weight has gradually come back.  She wants to lose weight by watching her diet and exercising.  She has not taken phentermine in over a month and does not plan to go back on it.  She continues to smoke cigarettes.  She is down to half a pack per day.  Family history significant for various malignancies.  Her father had surgery for colon carcinoma at age 6.  He also was treated for prostate carcinoma with that the last 2 years.  He is 50 years old now. Mother had bilateral mastectomy for carcinoma at age 92 and now she is being treated for mets to her bone. Paternal aunt had surgery for stomach cancer at age 71 and is doing fine 14 years later.  Maternal grandmother had brain cancer.  Current Medications: Outpatient Encounter Medications as of 08/11/2018  Medication Sig  . aspirin-acetaminophen-caffeine (EXCEDRIN MIGRAINE) 250-250-65 MG tablet Take 2  tablets by mouth every 6 (six) hours as needed for headache or migraine.  . busPIRone (BUSPAR) 15 MG tablet Take 1 tablet (15 mg total) by mouth 3 (three) times daily.  . cyclobenzaprine (FLEXERIL) 10 MG tablet Take 1 tablet (10 mg total) by mouth every 8 (eight) hours as needed for muscle spasms. (Patient taking differently: Take 30 mg by mouth at bedtime. )  . DULoxetine (CYMBALTA) 60 MG capsule Take 1 capsule (60 mg total) by mouth 2 (two) times daily.  Marland Kitchen gabapentin (NEURONTIN) 600 MG tablet Take 2 tablets (1,200 mg total) by mouth 3 (three) times daily. (Patient taking differently: Take 3,600 mg by mouth at bedtime. )  . polyethylene glycol powder (GLYCOLAX/MIRALAX) powder Take 17 g by mouth daily.  . rizatriptan (MAXALT-MLT) 10 MG disintegrating tablet Take 1 tablet (10 mg total) by mouth as needed for migraine. May repeat in 2 hours if needed  . phentermine (ADIPEX-P) 37.5 MG tablet Take 1 tablet by mouth daily.  . [DISCONTINUED] fluconazole (DIFLUCAN) 150 MG tablet Take 1 tablet po once. May repeat dose in 3 days as needed for persistent symptoms. (Patient not taking: Reported on 08/11/2018)  . [DISCONTINUED] hydrochlorothiazide (HYDRODIURIL) 25 MG tablet Take 1/2 to 1 tablet po QD prn edema (Patient not taking: Reported on 08/11/2018)   No facility-administered encounter medications on file as of 08/11/2018.      Objective: Blood pressure 118/80, pulse 72, temperature 97.9  F (36.6 C), temperature source Oral, resp. rate 18, height 5\' 7"  (1.702 m), weight 218 lb 3.2 oz (99 kg). Patient is alert and in no acute distress. Conjunctiva is pink. Sclera is nonicteric Oropharyngeal mucosa is normal. She has a tongue ring in place. No neck masses or thyromegaly noted. Cardiac exam with regular rhythm normal S1 and S2. No murmur or gallop noted. Lungs are clear to auscultation. Abdomen is full.  On palpation it is soft with mild tenderness in LLQ and RLQ.  No organomegaly or masses. No LE edema  or clubbing noted.  Labs/studies Results:  TSH 0.666 on 04/01/2017  CBC on 07/09/2018 WBC 10.6, H&H 14.3 and 42.0 and platelet count 305K. Serum calcium 9.4.  Assessment:  #1.  Chronic constipation.  She is not having loose stools.  She can titrate polyethylene glycol dose to desired result.  Goal is for her to have 1 formed stool daily every other day.  #2.  Family history positive for colon carcinoma and a father along with multiple other malignancies and second-degree relatives.  Her father had colon carcinoma at age 5.  Patient has never been screened for CRC.  Plan:  Medication list updated.  Phentermine deleted. Continue high-fiber diet. Patient advised to titrate polyethylene glycol dose so that she would not have loose stools. High risk screening colonoscopy to be scheduled. Office visit in 6 months.

## 2018-08-11 NOTE — Patient Instructions (Signed)
Can titrate MiraLAX dose as discussed. Continue high-fiber diet. Colonoscopy to be scheduled.

## 2018-08-14 ENCOUNTER — Other Ambulatory Visit: Payer: Self-pay

## 2018-08-14 DIAGNOSIS — G43019 Migraine without aura, intractable, without status migrainosus: Secondary | ICD-10-CM

## 2018-08-14 MED ORDER — RIZATRIPTAN BENZOATE 10 MG PO TBDP: 10.0000 mg | ORAL_TABLET | ORAL | 2 refills | Status: DC | PRN

## 2018-08-27 NOTE — Patient Instructions (Signed)
Andrea [name]  08/27/2018     @PREFPERIOPPHARMACY @   Your procedure is scheduled on  09/01/2018 .  Report to [facility] at  700   A.M.  Call this number if you have problems the morning of surgery:  [phone]   Remember:  Do not eat or drink after midnight.  You may drink clear liquids until  (follow the instructions given to you) .  Clear liquids allowed are:                    Water, Juice (non-citric and without pulp), Carbonated beverages, Clear Tea, Black Coffee only, Plain Jell-O only, Gatorade and Plain Popsicles only    Take these medicines the morning of surgery with A SIP OF WATER  Buspar, cymbalta, maxalt.    Do not wear jewelry, make-up or nail polish.  Do not wear lotions, powders, or perfumes, or deodorant.  Do not shave 48 hours prior to surgery.  Men may shave face and neck.  Do not bring valuables to the hospital.  [facility] is not responsible for any belongings or valuables.  Contacts, dentures or bridgework may not be worn into surgery.  Leave your suitcase in the car.  After surgery it may be brought to your room.  For patients admitted to the hospital, discharge time will be determined by your treatment team.  Patients discharged the day of surgery will not be allowed to drive home.   Name and phone number of your driver:   family Special instructions:  Follow the diet and prep instructions given to you by Dr Patty Sermons office.  Please read over the following fact sheets that you were given. Anesthesia Post-op Instructions and Care and Recovery After Surgery       Colonoscopy, Adult A colonoscopy is an exam to look at the large intestine. It is done to check for problems, such as:  Lumps (tumors).  Growths (polyps).  Swelling (inflammation).  Bleeding.  What happens before the procedure? Eating and drinking Follow instructions from your doctor about eating and drinking. These instructions may include:  A few days before  the procedure - follow a low-fiber diet. ? Avoid nuts. ? Avoid seeds. ? Avoid dried fruit. ? Avoid raw fruits. ? Avoid vegetables.  1-3 days before the procedure - follow a clear liquid diet. Avoid liquids that have red or purple dye. Drink only clear liquids, such as: ? Clear broth or bouillon. ? Black coffee or tea. ? Clear juice. ? Clear soft drinks or sports drinks. ? Gelatin dessert. ? Popsicles.  On the day of the procedure - do not eat or drink anything during the 2 hours before the procedure.  Bowel prep If you were prescribed an oral bowel prep:  Take it as told by your doctor. Starting the day before your procedure, you will need to drink a lot of liquid. The liquid will cause you to poop (have bowel movements) until your poop is almost clear or light green.  If your skin or butt gets irritated from diarrhea, you may: ? Wipe the area with wipes that have medicine in them, such as adult wet wipes with aloe and vitamin E. ? Put something on your skin that soothes the area, such as petroleum jelly.  If you throw up (vomit) while drinking the bowel prep, take a break for up to 60 minutes. Then begin the bowel prep again. If you keep throwing up  and you cannot take the bowel prep without throwing up, call your doctor.  General instructions  Ask your doctor about changing or stopping your normal medicines. This is important if you take diabetes medicines or blood thinners.  Plan to have someone take you home from the hospital or clinic. What happens during the procedure?  An IV tube may be put into one of your veins.  You will be given medicine to help you relax (sedative).  To reduce your risk of infection: ? Your doctors will wash their hands. ? Your anal area will be washed with soap.  You will be asked to lie on your side with your knees bent.  Your doctor will get a long, thin, flexible tube ready. The tube will have a camera and a light on the end.  The tube  will be put into your anus.  The tube will be gently put into your large intestine.  Air will be delivered into your large intestine to keep it open. You may feel some pressure or cramping.  The camera will be used to take photos.  A small tissue sample may be removed from your body to be looked at under a microscope (biopsy). If any possible problems are found, the tissue will be sent to a lab for testing.  If small growths are found, your doctor may remove them and have them checked for cancer.  The tube that was put into your anus will be slowly removed. The procedure may vary among doctors and hospitals. What happens after the procedure?  Your doctor will check on you often until the medicines you were given have worn off.  Do not drive for 24 hours after the procedure.  You may have a small amount of blood in your poop.  You may pass gas.  You may have mild cramps or bloating in your belly (abdomen).  It is up to you to get the results of your procedure. Ask your doctor, or the department performing the procedure, when your results will be ready. This information is not intended to replace advice given to you by your health care provider. Make sure you discuss any questions you have with your health care provider. Document Released: 12/21/2010 Document Revised: 09/18/2016 Document Reviewed: 01/30/2016 Elsevier Interactive Patient Education  2017 [employer]  Colonoscopy, Adult, Care After This sheet gives you information about how to care for yourself after your procedure. Your health care provider may also give you more specific instructions. If you have problems or questions, contact your health care provider. What can I expect after the procedure? After the procedure, it is common to have:  A small amount of blood in your stool for 24 hours after the procedure.  Some gas.  Mild abdominal cramping or bloating.  Follow these instructions at home: General  instructions   For the first 24 hours after the procedure: ? Do not drive or use machinery. ? Do not sign important documents. ? Do not drink alcohol. ? Do your regular daily activities at a slower pace than normal. ? Eat soft, easy-to-digest foods. ? Rest often.  Take over-the-counter or prescription medicines only as told by your health care provider.  It is up to you to get the results of your procedure. Ask your health care provider, or the department performing the procedure, when your results will be ready. Relieving cramping and bloating  Try walking around when you have cramps or feel bloated.  Apply heat to your abdomen as  told by your health care provider. Use a heat source that your health care provider recommends, such as a moist heat pack or a heating pad. ? Place a towel between your skin and the heat source. ? Leave the heat on for 20-30 minutes. ? Remove the heat if your skin turns bright red. This is especially important if you are unable to feel pain, heat, or cold. You may have a greater risk of getting burned. Eating and drinking  Drink enough fluid to keep your urine clear or pale yellow.  Resume your normal diet as instructed by your health care provider. Avoid heavy or fried foods that are hard to digest.  Avoid drinking alcohol for as long as instructed by your health care provider. Contact a health care provider if:  You have blood in your stool 2-3 days after the procedure. Get help right away if:  You have more than a small spotting of blood in your stool.  You pass large blood clots in your stool.  Your abdomen is swollen.  You have nausea or vomiting.  You have a fever.  You have increasing abdominal pain that is not relieved with medicine. This information is not intended to replace advice given to you by your health care provider. Make sure you discuss any questions you have with your health care provider. Document Released: 07/02/2004  Document Revised: 08/12/2016 Document Reviewed: 01/30/2016 Elsevier Interactive Patient Education  2018 [place] Anesthesia is a term that refers to techniques, procedures, and medicines that help a person stay safe and comfortable during a medical procedure. Monitored anesthesia care, or sedation, is one type of anesthesia. Your anesthesia specialist may recommend sedation if you will be having a procedure that does not require you to be unconscious, such as:  Cataract surgery.  A dental procedure.  A biopsy.  A colonoscopy.  During the procedure, you may receive a medicine to help you relax (sedative). There are three levels of sedation:  Mild sedation. At this level, you may feel awake and relaxed. You will be able to follow directions.  Moderate sedation. At this level, you will be sleepy. You may not remember the procedure.  Deep sedation. At this level, you will be asleep. You will not remember the procedure.  The more medicine you are given, the deeper your level of sedation will be. Depending on how you respond to the procedure, the anesthesia specialist may change your level of sedation or the type of anesthesia to fit your needs. An anesthesia specialist will monitor you closely during the procedure. Let your health care provider know about:  Any allergies you have.  All medicines you are taking, including vitamins, herbs, eye drops, creams, and over-the-counter medicines.  Any use of steroids (by mouth or as a cream).  Any problems you or family members have had with sedatives and anesthetic medicines.  Any blood disorders you have.  Any surgeries you have had.  Any medical conditions you have, such as sleep apnea.  Whether you are pregnant or may be pregnant.  Any use of cigarettes, alcohol, or street drugs. What are the risks? Generally, this is a safe procedure. However, problems may occur, including:  Getting too much  medicine (oversedation).  Nausea.  Allergic reaction to medicines.  Trouble breathing. If this happens, a breathing tube may be used to help with breathing. It will be removed when you are awake and breathing on your own.  Heart trouble.  Lung trouble.  Before the procedure Staying hydrated Follow instructions from your health care provider about hydration, which may include:  Up to 2 hours before the procedure - you may continue to drink clear liquids, such as water, clear fruit juice, black coffee, and plain tea.  Eating and drinking restrictions Follow instructions from your health care provider about eating and drinking, which may include:  8 hours before the procedure - stop eating heavy meals or foods such as meat, fried foods, or fatty foods.  6 hours before the procedure - stop eating light meals or foods, such as toast or cereal.  6 hours before the procedure - stop drinking milk or drinks that contain milk.  2 hours before the procedure - stop drinking clear liquids.  Medicines Ask your health care provider about:  Changing or stopping your regular medicines. This is especially important if you are taking diabetes medicines or blood thinners.  Taking medicines such as aspirin and ibuprofen. These medicines can thin your blood. Do not take these medicines before your procedure if your health care provider instructs you not to.  Tests and exams  You will have a physical exam.  You may have blood tests done to show: ? How well your kidneys and liver are working. ? How well your blood can clot.  General instructions  Plan to have someone take you home from the hospital or clinic.  If you will be going home right after the procedure, plan to have someone with you for 24 hours.  What happens during the procedure?  Your blood pressure, heart rate, breathing, level of pain and overall condition will be monitored.  An IV tube will be inserted into one of your  veins.  Your anesthesia specialist will give you medicines as needed to keep you comfortable during the procedure. This may mean changing the level of sedation.  The procedure will be performed. After the procedure  Your blood pressure, heart rate, breathing rate, and blood oxygen level will be monitored until the medicines you were given have worn off.  Do not drive for 24 hours if you received a sedative.  You may: ? Feel sleepy, clumsy, or nauseous. ? Feel forgetful about what happened after the procedure. ? Have a sore throat if you had a breathing tube during the procedure. ? Vomit. This information is not intended to replace advice given to you by your health care provider. Make sure you discuss any questions you have with your health care provider. Document Released: 08/14/2005 Document Revised: 04/26/2016 Document Reviewed: 03/10/2016 Elsevier Interactive Patient Education  2018 [place], Care After These instructions provide you with information about caring for yourself after your procedure. Your health care provider may also give you more specific instructions. Your treatment has been planned according to current medical practices, but problems sometimes occur. Call your health care provider if you have any problems or questions after your procedure. What can I expect after the procedure? After your procedure, it is common to:  Feel sleepy for several hours.  Feel clumsy and have poor balance for several hours.  Feel forgetful about what happened after the procedure.  Have poor judgment for several hours.  Feel nauseous or vomit.  Have a sore throat if you had a breathing tube during the procedure.  Follow these instructions at home: For at least 24 hours after the procedure:   Do not: ? Participate in activities in which you could fall or become injured. ? Drive. ?  Use heavy machinery. ? Drink alcohol. ? Take sleeping pills or  medicines that cause drowsiness. ? Make important decisions or sign legal documents. ? Take care of children on your own.  Rest. Eating and drinking  Follow the diet that is recommended by your health care provider.  If you vomit, drink water, juice, or soup when you can drink without vomiting.  Make sure you have little or no nausea before eating solid foods. General instructions  Have a responsible adult stay with you until you are awake and alert.  Take over-the-counter and prescription medicines only as told by your health care provider.  If you smoke, do not smoke without supervision.  Keep all follow-up visits as told by your health care provider. This is important. Contact a health care provider if:  You keep feeling nauseous or you keep vomiting.  You feel light-headed.  You develop a rash.  You have a fever. Get help right away if:  You have trouble breathing. This information is not intended to replace advice given to you by your health care provider. Make sure you discuss any questions you have with your health care provider. Document Released: 03/10/2016 Document Revised: 07/10/2016 Document Reviewed: 03/10/2016 Elsevier Interactive Patient Education  [employer].

## 2018-08-31 ENCOUNTER — Encounter (HOSPITAL_COMMUNITY): Payer: Self-pay

## 2018-08-31 ENCOUNTER — Other Ambulatory Visit: Payer: Self-pay

## 2018-08-31 ENCOUNTER — Encounter (HOSPITAL_COMMUNITY)
Admission: RE | Admit: 2018-08-31 | Discharge: 2018-08-31 | Disposition: A | Discharge status: Home / Self Care | Payer: Medicaid Other | Source: Ambulatory Visit | Attending: Internal Medicine | Admitting: Internal Medicine

## 2018-08-31 DIAGNOSIS — K5909 Other constipation: Secondary | ICD-10-CM | POA: Insufficient documentation

## 2018-08-31 DIAGNOSIS — Z01812 Encounter for preprocedural laboratory examination: Secondary | ICD-10-CM | POA: Insufficient documentation

## 2018-08-31 DIAGNOSIS — Z800 Family history of malignant neoplasm of digestive organs: Secondary | ICD-10-CM | POA: Diagnosis not present

## 2018-08-31 HISTORY — DX: Unspecified convulsions: R56.9

## 2018-08-31 LAB — BASIC METABOLIC PANEL
ANION GAP: 11 (ref 5–15)
BUN: 10 mg/dL (ref 6–20)
CO2: 22 mmol/L (ref 22–32)
Calcium: 9.3 mg/dL (ref 8.9–10.3)
Chloride: 108 mmol/L (ref 98–111)
Creatinine, Ser: 0.71 mg/dL (ref 0.44–1.00)
GFR calc non Af Amer: >60 mL/min (ref >60)
Glucose, Bld: 98 mg/dL (ref 70–99)
Potassium: 3.8 mmol/L (ref 3.5–5.1)
SODIUM: 141 mmol/L (ref 135–145)

## 2018-08-31 LAB — CBC WITH DIFFERENTIAL/PLATELET
BASOS ABS: 0 10*3/uL (ref 0.0–0.1)
Basophils Relative: 0 %
EOS ABS: 0.3 10*3/uL (ref 0.0–0.7)
Eosinophils Relative: 3 %
HCT: 41 % (ref 36.0–46.0)
Hemoglobin: 13.8 g/dL (ref 12.0–15.0)
Lymphocytes Relative: 25 %
Lymphs Abs: 3 10*3/uL (ref 0.7–4.0)
MCH: 30 pg (ref 26.0–34.0)
MCHC: 33.7 g/dL (ref 30.0–36.0)
MCV: 89.1 fL (ref 78.0–100.0)
Monocytes Absolute: 0.7 10*3/uL (ref 0.1–1.0)
Monocytes Relative: 6 %
Neutro Abs: 8.1 10*3/uL — ABNORMAL HIGH (ref 1.7–7.7)
Neutrophils Relative %: 66 %
Platelets: 298 10*3/uL (ref 150–400)
RBC: 4.6 MIL/uL (ref 3.87–5.11)
RDW: 13.5 % (ref 11.5–15.5)
WBC: 12.1 10*3/uL — AB (ref 4.0–10.5)

## 2018-08-31 LAB — HCG, SERUM, QUALITATIVE: PREG SERUM: NEGATIVE

## 2018-09-11 ENCOUNTER — Ambulatory Visit (HOSPITAL_COMMUNITY): Admission: RE | Admit: 2018-09-11 | Payer: Medicaid Other | Source: Ambulatory Visit | Admitting: Internal Medicine

## 2018-09-11 ENCOUNTER — Encounter (HOSPITAL_COMMUNITY): Admission: RE | Payer: Self-pay | Source: Ambulatory Visit

## 2018-09-11 SURGERY — COLONOSCOPY WITH PROPOFOL
Anesthesia: Monitor Anesthesia Care

## 2018-10-23 ENCOUNTER — Ambulatory Visit: Payer: Medicaid Other | Admitting: Nurse Practitioner

## 2018-10-23 ENCOUNTER — Encounter: Payer: Self-pay | Admitting: Nurse Practitioner

## 2018-10-23 VITALS — BP 140/88 | HR 97 | Resp 16 | Ht 67.0 in | Wt 239.2 lb

## 2018-10-23 DIAGNOSIS — S62513A Displaced fracture of proximal phalanx of unspecified thumb, initial encounter for closed fracture: Secondary | ICD-10-CM | POA: Insufficient documentation

## 2018-10-23 DIAGNOSIS — M779 Enthesopathy, unspecified: Secondary | ICD-10-CM

## 2018-10-23 DIAGNOSIS — K5909 Other constipation: Secondary | ICD-10-CM

## 2018-10-23 DIAGNOSIS — Z0001 Encounter for general adult medical examination with abnormal findings: Secondary | ICD-10-CM

## 2018-10-23 DIAGNOSIS — R300 Dysuria: Secondary | ICD-10-CM

## 2018-10-23 DIAGNOSIS — Z124 Encounter for screening for malignant neoplasm of cervix: Secondary | ICD-10-CM | POA: Diagnosis not present

## 2018-10-23 DIAGNOSIS — F411 Generalized anxiety disorder: Secondary | ICD-10-CM

## 2018-10-23 DIAGNOSIS — M25569 Pain in unspecified knee: Secondary | ICD-10-CM | POA: Insufficient documentation

## 2018-10-23 DIAGNOSIS — M778 Other enthesopathies, not elsewhere classified: Secondary | ICD-10-CM | POA: Insufficient documentation

## 2018-10-23 MED ORDER — BUSPIRONE HCL 30 MG PO TABS: 30.0000 mg | ORAL_TABLET | Freq: Three times a day (TID) | ORAL | 3 refills | Status: DC

## 2018-10-23 MED ORDER — POLYETHYLENE GLYCOL 3350 17 GM/SCOOP PO POWD: 17.0000 g | Freq: Every day | ORAL | 3 refills | Status: DC

## 2018-10-23 MED ORDER — HYDROXYZINE HCL 25 MG PO TABS: ORAL_TABLET | ORAL | 0 refills | Status: DC

## 2018-10-23 NOTE — Progress Notes (Signed)
[facility] [place], [place] [address]  Internal MEDICINE  Office Visit Note  Patient Name: Andrea [name]  [id number]  [id number]  Date of Service: 10/25/2018   Pt is here for routine health maintenance examination  Chief Complaint  Patient presents with  . Annual Exam    pt wants to know about referral to ortho for knees  . Gynecologic Exam  . Anxiety    pt has some concerns about her anxiety  . Quality Metric Gaps    pt will get the flu vaccine at the pharmacy, pneumonia vaccine     Patient arrives to the clinic for her annual wellness check up. Patient is currently under lots of stress due to family circumstances. Patient feels more anxious and overwhelmed than usual. She has slightly increased blood pressure during today's visit. Blood pressure will be reevaluated on her next follow up appointment. Patient was not able to do her colonoscopy but plans to reschedule soon. Patient did not have any bloody stools for the past seven months. Otherwise, patient is stable and does not have any additional concerns or questions.     Current Medication: Outpatient Encounter Medications as of 10/23/2018  Medication Sig  . aspirin-acetaminophen-caffeine (EXCEDRIN MIGRAINE) 250-250-65 MG tablet Take 2 tablets by mouth every 6 (six) hours as needed for headache or migraine.  . busPIRone (BUSPAR) 30 MG tablet Take 1 tablet (30 mg total) by mouth 3 (three) times daily.  . cyclobenzaprine (FLEXERIL) 10 MG tablet Take 1 tablet (10 mg total) by mouth every 8 (eight) hours as needed for muscle spasms. (Patient taking differently: Take 30 mg by mouth at bedtime. )  . DULoxetine (CYMBALTA) 60 MG capsule Take 1 capsule (60 mg total) by mouth 2 (two) times daily.  Marland Kitchen gabapentin (NEURONTIN) 600 MG tablet Take 2 tablets (1,200 mg total) by mouth 3 (three) times daily. (Patient taking differently: Take 3,600 mg by mouth at bedtime. )  . polyethylene glycol powder  (GLYCOLAX/MIRALAX) powder Take 17 g by mouth daily.  . rizatriptan (MAXALT-MLT) 10 MG disintegrating tablet Take 1 tablet (10 mg total) by mouth as needed for migraine. May repeat in 2 hours if needed  . [DISCONTINUED] busPIRone (BUSPAR) 15 MG tablet Take 1 tablet (15 mg total) by mouth 3 (three) times daily. (Patient taking differently: Take 15-30 mg by mouth See admin instructions. Take '30mg'$  by mouth in the morning and '15mg'$  in the evening)  . [DISCONTINUED] polyethylene glycol powder (GLYCOLAX/MIRALAX) powder Take 17 g by mouth daily.  . hydrOXYzine (ATARAX/VISTARIL) 25 MG tablet Take 1 to 2 tablets at night as needed for anxiety/insomnia   No facility-administered encounter medications on file as of 10/23/2018.     Surgical History: Past Surgical History:  Procedure Laterality Date  . CESAREAN SECTION N/A 04/24/2016   Procedure: CESAREAN SECTION;  Surgeon: Shelly Bombard, MD;  Location: [facility];  Service: Obstetrics;  Laterality: N/A;  . CHOLECYSTECTOMY    . TONSILLECTOMY    . TONSILLECTOMY AND ADENOIDECTOMY      Medical History: Past Medical History:  Diagnosis Date  . Abnormal finding on MRI of brain 01/02/2017  . Anxiety and depression    see progress notes -sw  . Common migraine with intractable migraine 01/02/2017  . Polycystic ovarian disease   . Psoriasis   . Rosacea   . Seizure [facility])    as child from trauma; no meds and no further seizures    Family History: Family History  Problem Relation  Age of Onset  . Breast cancer Mother   . Bone cancer Mother   . Migraines Mother   . Colon cancer Father   . Prostate cancer Father   . Brain cancer Maternal Grandmother   . Migraines Maternal Grandmother   . Breast cancer Maternal Grandmother   . Migraines Sister       Review of Systems  Constitutional: Positive for fatigue. Negative for activity change, appetite change, fever and unexpected weight change.  HENT: Negative for congestion, ear pain, facial  swelling, postnasal drip, rhinorrhea, sinus pressure, sinus pain, sore throat and voice change.   Eyes: Negative for photophobia, pain and visual disturbance.  Respiratory: Negative for cough, chest tightness, shortness of breath, wheezing and stridor.   Cardiovascular: Negative for chest pain, palpitations and leg swelling.  Gastrointestinal: Positive for constipation. Negative for abdominal distention, abdominal pain, diarrhea, nausea and vomiting.       Patient can go 4 days without BM, needs Miralax prescription  Endocrine: Negative for cold intolerance, heat intolerance, polydipsia, polyphagia and polyuria.  Genitourinary: Negative for dysuria, flank pain, frequency, hematuria, menstrual problem, pelvic pain, urgency, vaginal bleeding, vaginal discharge and vaginal pain.  Musculoskeletal: Positive for back pain and myalgias. Negative for arthralgias and gait problem.       Chronic stable back pain  Skin: Negative for color change, rash and wound.  Allergic/Immunologic: Negative for environmental allergies.  Neurological: Positive for weakness and headaches. Negative for dizziness, seizures, syncope, facial asymmetry, speech difficulty and numbness.  Hematological: Negative for adenopathy.  Psychiatric/Behavioral: Positive for dysphoric mood. The patient is nervous/anxious.      Vital Signs: BP 140/88 (BP Location: Right Arm, Patient Position: Sitting, Cuff Size: Large)   Pulse 97   Resp 16   Ht '5\' 7"'$  (1.702 m)   Wt 239 lb 3.2 oz (108.5 kg)   SpO2 99%   BMI 37.46 kg/m    Physical Exam  Constitutional: She is oriented to person, place, and time. She appears well-developed and well-nourished. No distress.  HENT:  Head: Normocephalic and atraumatic.  Right Ear: External ear normal.  Left Ear: External ear normal.  Nose: Nose normal.  Mouth/Throat: Oropharynx is clear and moist. No oropharyngeal exudate.  Eyes: Pupils are equal, round, and reactive to light. Conjunctivae and EOM  are normal. Right eye exhibits no discharge. Left eye exhibits no discharge. No scleral icterus.  Neck: Normal range of motion. Neck supple. No tracheal deviation present. No thyromegaly present.  Cardiovascular: Normal rate, regular rhythm, normal heart sounds and intact distal pulses. Exam reveals no gallop and no friction rub.  No murmur heard. Pulmonary/Chest: Effort normal and breath sounds normal. No respiratory distress. She has no wheezes. She has no rales. She exhibits no mass, no tenderness, no laceration, no crepitus, no edema, no deformity, no swelling and no retraction. Right breast exhibits no inverted nipple, no mass, no nipple discharge, no skin change and no tenderness. Left breast exhibits no inverted nipple, no mass, no nipple discharge, no skin change and no tenderness. No breast swelling, tenderness, discharge or bleeding. Breasts are symmetrical.  Abdominal: Soft. Bowel sounds are normal. She exhibits no distension and no mass. There is no tenderness. There is no guarding.  Genitourinary: Vagina normal and uterus normal. Rectal exam shows external hemorrhoid. No breast swelling, tenderness, discharge or bleeding. Pelvic exam was performed with patient supine. There is no rash, tenderness, lesion or injury on the right labia. There is no rash, tenderness, lesion or injury on the  left labia. Cervix exhibits no motion tenderness, no discharge and no friability. No erythema, tenderness or bleeding in the vagina. No foreign body in the vagina. No signs of injury around the vagina. No vaginal discharge found.  Genitourinary Comments: No tenderness, masses, or organomeglay present during bimanual exam .  Musculoskeletal: Normal range of motion. She exhibits no edema, tenderness or deformity.  Lymphadenopathy:    She has no cervical adenopathy.  Neurological: She is alert and oriented to person, place, and time.  Skin: Skin is warm and dry. Capillary refill takes less than 2 seconds. No  rash noted. She is not diaphoretic. No erythema. No pallor.  Psychiatric: Her speech is normal and behavior is normal. Judgment and thought content normal. Her mood appears anxious. Cognition and memory are normal. She exhibits a depressed mood.  Nursing note and vitals reviewed.    LABS: Recent Results (from the past 2160 hour(s))  CBC with Differential/Platelet     Status: Abnormal   Collection Time: 08/31/18  3:05 PM  Result Value Ref Range   WBC 12.1 (H) 4.0 - 10.5 K/uL   RBC 4.60 3.87 - 5.11 MIL/uL   Hemoglobin 13.8 12.0 - 15.0 g/dL   HCT 41.0 36.0 - 46.0 %   MCV 89.1 78.0 - 100.0 fL   MCH 30.0 26.0 - 34.0 pg   MCHC 33.7 30.0 - 36.0 g/dL   RDW 13.5 11.5 - 15.5 %   Platelets 298 150 - 400 K/uL   Neutrophils Relative % 66 %   Neutro Abs 8.1 (H) 1.7 - 7.7 K/uL   Lymphocytes Relative 25 %   Lymphs Abs 3.0 0.7 - 4.0 K/uL   Monocytes Relative 6 %   Monocytes Absolute 0.7 0.1 - 1.0 K/uL   Eosinophils Relative 3 %   Eosinophils Absolute 0.3 0.0 - 0.7 K/uL   Basophils Relative 0 %   Basophils Absolute 0.0 0.0 - 0.1 K/uL    Comment: Performed at [facility], [address], [place], [place] [address]  Basic metabolic panel     Status: None   Collection Time: 08/31/18  3:05 PM  Result Value Ref Range   Sodium 141 135 - 145 mmol/L   Potassium 3.8 3.5 - 5.1 mmol/L   Chloride 108 98 - 111 mmol/L   CO2 22 22 - 32 mmol/L   Glucose, Bld 98 70 - 99 mg/dL   BUN 10 6 - 20 mg/dL   Creatinine, Ser 0.71 0.44 - 1.00 mg/dL   Calcium 9.3 8.9 - 10.3 mg/dL   GFR calc non Af Amer >60 >60 mL/min   GFR calc Af Amer >60 >60 mL/min    Comment: (NOTE) The eGFR has been calculated using the CKD EPI equation. This calculation has not been validated in all clinical situations. eGFR's persistently <60 mL/min signify possible Chronic Kidney Disease.    Anion gap 11 5 - 15    Comment: Performed at [facility], [address] [place], [place] [address]  hCG, serum, qualitative (Not at [facility])      Status: None   Collection Time: 08/31/18  3:05 PM  Result Value Ref Range   Preg, Serum NEGATIVE NEGATIVE    Comment:        THE SENSITIVITY OF THIS METHODOLOGY IS >10 mIU/mL. Performed at [facility], [address], [place], [place] [address]   UA/M w/rflx Culture, Routine     Status: None   Collection Time: 10/23/18 10:34 AM  Result Value Ref Range  Specific Gravity, UA 1.009 1.005 - 1.030   pH, UA 7.0 5.0 - 7.5   Color, UA Yellow Yellow   Appearance Ur Clear Clear   Leukocytes, UA Negative Negative   Protein, UA Negative Negative/Trace   Glucose, UA Negative Negative   Ketones, UA Negative Negative   RBC, UA Negative Negative   Bilirubin, UA Negative Negative   Urobilinogen, Ur 0.2 0.2 - 1.0 mg/dL   Nitrite, UA Negative Negative   Microscopic Examination Comment     Comment: Microscopic follows if indicated.   Microscopic Examination See below:     Comment: Microscopic was indicated and was performed.   Urinalysis Reflex Comment     Comment: This specimen will not reflex to a Urine Culture.  Microscopic Examination     Status: None   Collection Time: 10/23/18 10:34 AM  Result Value Ref Range   WBC, UA 0-5 0 - 5 /hpf   RBC, UA 0-2 0 - 2 /hpf   Epithelial Cells (non renal) 0-10 0 - 10 /hpf   Casts None seen None seen /lpf   Bacteria, UA Few None seen/Few     Assessment/Plan: 1. Encounter for general adult medical examination with abnormal findings Annual health maintenance exam today  2. Other constipation Improved with daily dosing of miralax. Will continue. Refill provided today.  - polyethylene glycol powder (GLYCOLAX/MIRALAX) powder; Take 17 g by mouth daily.  Dispense: 3350 g; Refill: 3  3. Generalized anxiety disorder Pt has increased anxiety due to the family circumstances. Buspar dose is increased to '30mg'$  up to three times daily when needed.  Hydroxyzine is added for night time anxiety/insomnia. - busPIRone (BUSPAR) 30 MG tablet; Take 1 tablet (30 mg  total) by mouth 3 (three) times daily.  Dispense: 90 tablet; Refill: 3 - hydrOXYzine (ATARAX/VISTARIL) 25 MG tablet; Take 1 to 2 tablets at night as needed for anxiety/insomnia  Dispense: 30 tablet; Refill: 0  4. Screening for malignant neoplasm of cervix - Pap IG and HPV (high risk) DNA detection  5. Dysuria - UA/M w/rflx Culture, Routine  General Counseling: Andrea [name] verbalizes understanding of the findings of todays visit and agrees with plan of treatment. I have discussed any further diagnostic evaluation that may be needed or ordered today. We also reviewed her medications today. she has been encouraged to call the office with any questions or concerns that should arise related to todays visit.    Counseling: Patient advised to take 5-10 minutes for herself during the day to help with overwhelming anxiety. Patient is also counseled to make short term goals. Patient's medications are increased this visit and patient will return back to the office in 3 months for evaluation.   This patient was seen by Leretha Pol FNP Collaboration with Dr Lavera Guise as a part of collaborative care agreement  Orders Placed This Encounter  Procedures  . Microscopic Examination  . UA/M w/rflx Culture, Routine    Meds ordered this encounter  Medications  . busPIRone (BUSPAR) 30 MG tablet    Sig: Take 1 tablet (30 mg total) by mouth 3 (three) times daily.    Dispense:  90 tablet    Refill:  3    Please note increased dose    Order Specific Question:   Supervising Provider    Answer:   Lavera Guise [[id number]]  . hydrOXYzine (ATARAX/VISTARIL) 25 MG tablet    Sig: Take 1 to 2 tablets at night as needed for anxiety/insomnia    Dispense:  30 tablet    Refill:  0    Order Specific Question:   Supervising Provider    Answer:   Lavera Guise [[id number]]  . polyethylene glycol powder (GLYCOLAX/MIRALAX) powder    Sig: Take 17 g by mouth daily.    Dispense:  3350 g    Refill:  3    Order Specific Question:    Supervising Provider    Answer:   Lavera Guise [[id number]]    Time spent: [place], MD  Internal Medicine

## 2018-10-24 LAB — UA/M W/RFLX CULTURE, ROUTINE
BILIRUBIN UA: NEGATIVE
GLUCOSE, UA: NEGATIVE
KETONES UA: NEGATIVE
LEUKOCYTES UA: NEGATIVE
NITRITE UA: NEGATIVE
PROTEIN UA: NEGATIVE
RBC UA: NEGATIVE
Specific Gravity, UA: 1.009 (ref 1.005–1.030)
UUROB: 0.2 mg/dL (ref 0.2–1.0)
pH, UA: 7 (ref 5.0–7.5)

## 2018-10-24 LAB — MICROSCOPIC EXAMINATION: Casts: NONE SEEN /lpf

## 2018-10-25 DIAGNOSIS — Z124 Encounter for screening for malignant neoplasm of cervix: Secondary | ICD-10-CM | POA: Insufficient documentation

## 2018-10-28 LAB — PAP IG AND HPV HIGH-RISK: HPV, high-risk: NEGATIVE

## 2018-11-09 ENCOUNTER — Other Ambulatory Visit: Payer: Self-pay

## 2018-11-11 ENCOUNTER — Other Ambulatory Visit: Payer: Self-pay | Admitting: Nurse Practitioner

## 2018-11-11 DIAGNOSIS — G43019 Migraine without aura, intractable, without status migrainosus: Secondary | ICD-10-CM

## 2018-11-11 DIAGNOSIS — F411 Generalized anxiety disorder: Secondary | ICD-10-CM

## 2018-11-11 DIAGNOSIS — F321 Major depressive disorder, single episode, moderate: Secondary | ICD-10-CM

## 2018-11-11 MED ORDER — BUSPIRONE HCL 30 MG PO TABS
30.0000 mg | ORAL_TABLET | Freq: Three times a day (TID) | ORAL | 3 refills | Status: DC
Start: 2018-11-11 — End: 2020-03-14

## 2018-11-11 MED ORDER — DULOXETINE HCL 60 MG PO CPEP: 60.0000 mg | ORAL_CAPSULE | Freq: Two times a day (BID) | ORAL | 3 refills | Status: DC

## 2018-11-11 MED ORDER — RIZATRIPTAN BENZOATE 10 MG PO TBDP: 10.0000 mg | ORAL_TABLET | ORAL | 3 refills | Status: DC | PRN

## 2018-11-11 NOTE — Progress Notes (Signed)
Refilled duloxetine per pharmacy request.

## 2018-11-11 NOTE — Progress Notes (Signed)
Refilled buspirone and rizatriptan per pharmacy request.

## 2018-11-16 ENCOUNTER — Other Ambulatory Visit: Payer: Self-pay | Admitting: Nurse Practitioner

## 2018-11-16 ENCOUNTER — Telehealth: Payer: Self-pay

## 2018-11-16 DIAGNOSIS — F5101 Primary insomnia: Secondary | ICD-10-CM

## 2018-11-16 MED ORDER — TRAZODONE HCL 50 MG PO TABS: 25.0000 mg | ORAL_TABLET | Freq: Every evening | ORAL | 3 refills | Status: DC | PRN

## 2018-11-16 NOTE — Progress Notes (Signed)
Patent c/o continued difficulty sleeping. Add trazodone 50mg at bedtime. May take 1/2 to 1 tablet at night. Reduce hydroxyzine to 1 tablet at night when using trazodone. New prescription sent to her pharmacy

## 2018-11-16 NOTE — Telephone Encounter (Signed)
Informed pt of new rx sent to her pharmacy and the dosage change in current medication

## 2018-11-16 NOTE — Telephone Encounter (Signed)
Can you find out if she is taking 1 or 2 tablets at night? thanks

## 2018-11-16 NOTE — Telephone Encounter (Signed)
Patent c/o continued difficulty sleeping. Add trazodone 50mg  at bedtime. May take 1/2 to 1 tablet at night. Reduce hydroxyzine to 1 tablet at night when using trazodone. New prescription sent to her pharmacy

## 2018-12-07 ENCOUNTER — Other Ambulatory Visit: Payer: Self-pay | Admitting: Nurse Practitioner

## 2018-12-07 DIAGNOSIS — F411 Generalized anxiety disorder: Secondary | ICD-10-CM

## 2018-12-07 MED ORDER — HYDROXYZINE HCL 25 MG PO TABS: ORAL_TABLET | ORAL | 2 refills | Status: DC

## 2018-12-07 NOTE — Progress Notes (Signed)
Refilled hydroxyzube 25mg , taking 1-2 tablets at bedtime as needed per pharmacy request.

## 2019-01-13 ENCOUNTER — Other Ambulatory Visit: Payer: Self-pay

## 2019-01-25 ENCOUNTER — Other Ambulatory Visit: Payer: Self-pay | Admitting: Nurse Practitioner

## 2019-01-25 ENCOUNTER — Ambulatory Visit: Payer: Medicaid Other | Admitting: Nurse Practitioner

## 2019-01-25 VITALS — BP 137/96 | HR 105 | Resp 16 | Ht 67.0 in | Wt 252.4 lb

## 2019-01-25 DIAGNOSIS — M2550 Pain in unspecified joint: Secondary | ICD-10-CM

## 2019-01-25 DIAGNOSIS — F5101 Primary insomnia: Secondary | ICD-10-CM | POA: Diagnosis not present

## 2019-01-25 DIAGNOSIS — F332 Major depressive disorder, recurrent severe without psychotic features: Secondary | ICD-10-CM

## 2019-01-25 DIAGNOSIS — J0140 Acute pansinusitis, unspecified: Secondary | ICD-10-CM

## 2019-01-25 MED ORDER — CEFUROXIME AXETIL 500 MG PO TABS: 500.0000 mg | ORAL_TABLET | Freq: Two times a day (BID) | ORAL | 0 refills | Status: DC

## 2019-01-25 MED ORDER — IBUPROFEN 800 MG PO TABS: 800.0000 mg | ORAL_TABLET | Freq: Three times a day (TID) | ORAL | 1 refills | Status: DC | PRN

## 2019-01-25 MED ORDER — BREXPIPRAZOLE 1 MG PO TABS: 1.0000 mg | ORAL_TABLET | Freq: Every day | ORAL | 3 refills | Status: DC

## 2019-01-25 NOTE — Progress Notes (Signed)
[facility] [address] [place], [place] [address]  Internal MEDICINE  Office Visit Note  Patient Name: Andrea [name]  [id number]  [id number]  Date of Service: 03/10/2019  Chief Complaint  Patient presents with  . Depression  . Anxiety    Patient is currently under lots of stress due to family circumstances. Patient feels more anxious and overwhelmed than usual. She has slightly increased blood pressure during today's visit.  She is currently taking cymbalta 60mg  twice daily. She takes buspirone 30mg  up to three times daily as needed for acute anxiety.  She also takes rexulti 1mg  daily. She is also taking atarax at bedtime, one to two tablets at night to help her sleep.         Current Medication: Outpatient Encounter Medications as of 01/25/2019  Medication Sig  . aspirin-acetaminophen-caffeine (EXCEDRIN MIGRAINE) 250-250-65 MG tablet Take 2 tablets by mouth every 6 (six) hours as needed for headache or migraine.  . Brexpiprazole (REXULTI) 1 MG TABS Take 1 tablet (1 mg total) by mouth daily.  . busPIRone (BUSPAR) 30 MG tablet Take 1 tablet (30 mg total) by mouth 3 (three) times daily.  . cefUROXime (CEFTIN) 500 MG tablet Take 1 tablet (500 mg total) by mouth 2 (two) times daily with a meal.  . cyclobenzaprine (FLEXERIL) 10 MG tablet Take 1 tablet (10 mg total) by mouth every 8 (eight) hours as needed for muscle spasms. (Patient taking differently: Take 30 mg by mouth at bedtime. )  . DULoxetine (CYMBALTA) 60 MG capsule Take 1 capsule (60 mg total) by mouth 2 (two) times daily.  Marland Kitchen gabapentin (NEURONTIN) 600 MG tablet Take 2 tablets (1,200 mg total) by mouth 3 (three) times daily. (Patient taking differently: Take 3,600 mg by mouth at bedtime. )  . hydrOXYzine (ATARAX/VISTARIL) 25 MG tablet Take 1 to 2 tablets at night as needed for anxiety/insomnia  . ibuprofen (ADVIL,MOTRIN) 800 MG tablet Take 1 tablet (800 mg total) by mouth every 8 (eight) hours as needed for moderate  pain.  . polyethylene glycol powder (GLYCOLAX/MIRALAX) powder Take 17 g by mouth daily.  . rizatriptan (MAXALT-MLT) 10 MG disintegrating tablet Take 1 tablet (10 mg total) by mouth as needed for migraine. May repeat in 2 hours if needed  . [DISCONTINUED] traZODone (DESYREL) 50 MG tablet Take 0.5-1 tablets (25-50 mg total) by mouth at bedtime as needed for sleep.   No facility-administered encounter medications on file as of 01/25/2019.     Surgical History: Past Surgical History:  Procedure Laterality Date  . CESAREAN SECTION N/A 04/24/2016   Procedure: CESAREAN SECTION;  Surgeon: Brock Bad, MD;  Location: [facility] BIRTHING SUITES;  Service: Obstetrics;  Laterality: N/A;  . CHOLECYSTECTOMY    . TONSILLECTOMY    . TONSILLECTOMY AND ADENOIDECTOMY      Medical History: Past Medical History:  Diagnosis Date  . Abnormal finding on MRI of brain 01/02/2017  . Anxiety and depression    see progress notes -sw  . Common migraine with intractable migraine 01/02/2017  . Polycystic ovarian disease   . Psoriasis   . Rosacea   . Seizure (HCC)    as child from trauma; no meds and no further seizures    Family History: Family History  Problem Relation Age of Onset  . Breast cancer Mother   . Bone cancer Mother   . Migraines Mother   . Colon cancer Father   . Prostate cancer Father   . Brain cancer Maternal Grandmother   .  Migraines Maternal Grandmother   . Breast cancer Maternal Grandmother   . Migraines Sister     Social History   Socioeconomic History  . Marital status: Divorced    Spouse name: Not on file  . Number of children: 2  . Years of education: GED  . Highest education level: Not on file  Occupational History  . Occupation: N/A  Social Needs  . Financial resource strain: Not on file  . Food insecurity:    Worry: Not on file    Inability: Not on file  . Transportation needs:    Medical: Not on file    Non-medical: Not on file  Tobacco Use  . Smoking status:  Current Some Day Smoker    Packs/day: 0.50    Years: 6.00    Pack years: 3.00    Types: Cigarettes  . Smokeless tobacco: Never Used  Substance and Sexual Activity  . Alcohol use: No  . Drug use: No  . Sexual activity: Yes    Birth control/protection: None  Lifestyle  . Physical activity:    Days per week: Not on file    Minutes per session: Not on file  . Stress: Not on file  Relationships  . Social connections:    Talks on phone: Not on file    Gets together: Not on file    Attends religious service: Not on file    Active member of club or organization: Not on file    Attends meetings of clubs or organizations: Not on file    Relationship status: Not on file  . Intimate partner violence:    Fear of current or ex partner: Not on file    Emotionally abused: Not on file    Physically abused: Not on file    Forced sexual activity: Not on file  Other Topics Concern  . Not on file  Social History Narrative   Lives at home w/ her children   Right-handed   Caffeine: 1 cup coffee per day      Review of Systems  Constitutional: Positive for activity change and fatigue. Negative for chills, fever and unexpected weight change.  HENT: Positive for congestion, postnasal drip, rhinorrhea and sinus pressure. Negative for sinus pain, sneezing and sore throat.   Respiratory: Positive for cough. Negative for chest tightness, shortness of breath and wheezing.   Cardiovascular: Negative for chest pain and palpitations.  Gastrointestinal: Positive for constipation and nausea. Negative for abdominal pain, diarrhea and vomiting.  Endocrine: Negative for cold intolerance, heat intolerance, polydipsia and polyuria.  Musculoskeletal: Negative for arthralgias, back pain, joint swelling and neck pain.  Skin: Negative for rash.  Allergic/Immunologic: Negative for environmental allergies.  Neurological: Positive for dizziness, weakness and headaches. Negative for tremors and numbness.   Hematological: Positive for adenopathy. Does not bruise/bleed easily.  Psychiatric/Behavioral: Positive for agitation, dysphoric mood and sleep disturbance. Negative for behavioral problems (Depression) and suicidal ideas. The patient is nervous/anxious.     Today's Vitals   01/25/19 1128  BP: (!) 137/96  Pulse: (!) 105  Resp: 16  SpO2: 95%  Weight: 252 lb 6.4 oz (114.5 kg)  Height: 5\' 7"  (1.702 m)   Body mass index is 39.53 kg/m.  Physical Exam Vitals signs and nursing note reviewed.  Constitutional:      General: She is in acute distress.     Appearance: Normal appearance. She is well-developed. She is obese. She is not diaphoretic.  HENT:     Head: Normocephalic and atraumatic.  Nose: Congestion present.     Mouth/Throat:     Pharynx: Posterior oropharyngeal erythema present. No oropharyngeal exudate.  Eyes:     Conjunctiva/sclera: Conjunctivae normal.     Pupils: Pupils are equal, round, and reactive to light.  Neck:     Musculoskeletal: Normal range of motion and neck supple.     Thyroid: No thyromegaly.     Vascular: No JVD.     Trachea: No tracheal deviation.  Cardiovascular:     Rate and Rhythm: Normal rate and regular rhythm.     Heart sounds: Normal heart sounds. No murmur. No friction rub. No gallop.   Pulmonary:     Effort: Pulmonary effort is normal. No respiratory distress.     Breath sounds: Normal breath sounds. No wheezing or rales.  Chest:     Chest wall: No tenderness.  Abdominal:     General: Bowel sounds are normal.     Palpations: Abdomen is soft.     Tenderness: There is no abdominal tenderness.  Musculoskeletal: Normal range of motion.  Lymphadenopathy:     Cervical: No cervical adenopathy.  Skin:    General: Skin is warm and dry.     Capillary Refill: Capillary refill takes less than 2 seconds.  Neurological:     Mental Status: She is alert and oriented to person, place, and time.     Cranial Nerves: No cranial nerve deficit.      Sensory: No sensory deficit.  Psychiatric:        Attention and Perception: Attention and perception normal.        Mood and Affect: Mood is anxious and depressed.        Speech: Speech normal.        Behavior: Behavior normal. Behavior is cooperative.        Thought Content: Thought content normal.        Cognition and Memory: Cognition and memory normal.        Judgment: Judgment normal.   Assessment/Plan: 1. Acute non-recurrent pansinusitis Start ceftin  twice daily. Rest and increase fluids. Recommend use of OTC medication to improve symptoms..  - cefUROXime (CEFTIN) 500 MG tablet; Take 1 tablet (500 mg total) by mouth 2 (two) times daily with a meal.  Dispense: 20 tablet; Refill: 0  2. Severe episode of recurrent major depressive disorder, without psychotic features (HCC) Start rexulti  daily. Continue cymbalta twice daily, buspirone  up to three times daily as needed for acute anxiety, and atarax at bedtime as needed for anxiety/insomnia.  - Brexpiprazole (REXULTI) 1 MG TABS; Take 1 tablet (1 mg total) by mouth daily.  Dispense: 30 tablet; Refill: 3  3. Generalized joint pain May take ibuprofen  up to three times daily as needed for joint pain and inflammation.  - ibuprofen (ADVIL,MOTRIN) 800 MG tablet; Take 1 tablet (800 mg total) by mouth every 8 (eight) hours as needed for moderate pain.  Dispense: 60 tablet; Refill: 1  4. Primary insomnia May take atartax  up to three times daily as needed for anxiety/insomnia.   General Counseling: mihika surrette understanding of the findings of todays visit and agrees with plan of treatment. I have discussed any further diagnostic evaluation that may be needed or ordered today. We also reviewed her medications today. she has been encouraged to call the office with any questions or concerns that should arise related to todays visit.  This patient was seen by Vincent Gros FNP Collaboration with Dr Lyndon Code as  a  part of collaborative care agreement  Meds ordered this encounter  Medications  . ibuprofen (ADVIL,MOTRIN) 800 MG tablet    Sig: Take 1 tablet (800 mg total) by mouth every 8 (eight) hours as needed for moderate pain.    Dispense:  60 tablet    Refill:  1    Order Specific Question:   Supervising Provider    Answer:   Lyndon CodeKHAN, FOZIA M [[id number]]  . cefUROXime (CEFTIN) 500 MG tablet    Sig: Take 1 tablet (500 mg total) by mouth 2 (two) times daily with a meal.    Dispense:  20 tablet    Refill:  0    Order Specific Question:   Supervising Provider    Answer:   Lyndon CodeKHAN, FOZIA M [[id number]]  . Brexpiprazole (REXULTI) 1 MG TABS    Sig: Take 1 tablet (1 mg total) by mouth daily.    Dispense:  30 tablet    Refill:  3    Patient given sample of starter pack today.    Order Specific Question:   Supervising Provider    Answer:   Lyndon CodeKHAN, FOZIA M [[id number]]    Time spent: 2325 Minutes      Dr Lyndon CodeFozia M Khan Internal medicine

## 2019-01-26 LAB — BASIC METABOLIC PANEL
BUN/Creatinine Ratio: 13 (ref 9–23)
BUN: 12 mg/dL (ref 6–20)
CO2: 23 mmol/L (ref 20–29)
Calcium: 9.3 mg/dL (ref 8.7–10.2)
Chloride: 103 mmol/L (ref 96–106)
Creatinine, Ser: 0.96 mg/dL (ref 0.57–1.00)
GFR calc Af Amer: 88 mL/min/{1.73_m2} (ref >59)
GFR calc non Af Amer: 76 mL/min/{1.73_m2} (ref >59)
Glucose: 110 mg/dL — ABNORMAL HIGH (ref 65–99)
POTASSIUM: 4.1 mmol/L (ref 3.5–5.2)
Sodium: 139 mmol/L (ref 134–144)

## 2019-01-26 LAB — CBC
Hematocrit: 44.3 % (ref 34.0–46.6)
Hemoglobin: 14.8 g/dL (ref 11.1–15.9)
MCH: 28.8 pg (ref 26.6–33.0)
MCHC: 33.4 g/dL (ref 31.5–35.7)
MCV: 86 fL (ref 79–97)
Platelets: 315 10*3/uL (ref 150–450)
RBC: 5.14 x10E6/uL (ref 3.77–5.28)
RDW: 13.7 % (ref 11.7–15.4)
WBC: 10.5 10*3/uL (ref 3.4–10.8)

## 2019-01-26 LAB — SEDIMENTATION RATE: Sed Rate: 19 mm/hr (ref 0–32)

## 2019-01-26 LAB — ANA W/REFLEX: Anti Nuclear Antibody(ANA): NEGATIVE

## 2019-01-26 LAB — TSH: TSH: 1.44 u[IU]/mL (ref 0.450–4.500)

## 2019-01-26 LAB — RHEUMATOID FACTOR

## 2019-01-26 LAB — T4, FREE: Free T4: 1.02 ng/dL (ref 0.82–1.77)

## 2019-01-26 LAB — T3: T3, Total: 127 ng/dL (ref 71–180)

## 2019-02-09 ENCOUNTER — Ambulatory Visit (INDEPENDENT_AMBULATORY_CARE_PROVIDER_SITE_OTHER): Payer: Medicaid Other | Admitting: Internal Medicine

## 2019-02-16 ENCOUNTER — Other Ambulatory Visit: Payer: Self-pay | Admitting: Nurse Practitioner

## 2019-02-16 ENCOUNTER — Telehealth: Payer: Self-pay

## 2019-02-16 ENCOUNTER — Other Ambulatory Visit: Payer: Self-pay

## 2019-02-16 DIAGNOSIS — B372 Candidiasis of skin and nail: Secondary | ICD-10-CM

## 2019-02-16 MED ORDER — FLUCONAZOLE 150 MG PO TABS: ORAL_TABLET | ORAL | 0 refills | Status: DC

## 2019-02-16 MED ORDER — NYSTATIN 100000 UNIT/GM EX POWD: Freq: Four times a day (QID) | CUTANEOUS | 3 refills | Status: DC

## 2019-02-16 NOTE — Telephone Encounter (Signed)
Pt advised we send diflucan and nystatin powder

## 2019-02-16 NOTE — Telephone Encounter (Signed)
Sent this to [facility]. May apply powder to affected areas up to four times daily.

## 2019-02-16 NOTE — Progress Notes (Signed)
Sent nystatin powder which can be applied to affected areas up to four times daily as needed. Sent to [facility].

## 2019-02-23 ENCOUNTER — Ambulatory Visit: Payer: Self-pay | Admitting: Nurse Practitioner

## 2019-03-04 ENCOUNTER — Ambulatory Visit: Payer: Self-pay | Admitting: Nurse Practitioner

## 2019-03-09 ENCOUNTER — Other Ambulatory Visit: Payer: Self-pay

## 2019-03-09 DIAGNOSIS — F5101 Primary insomnia: Secondary | ICD-10-CM

## 2019-03-09 MED ORDER — TRAZODONE HCL 50 MG PO TABS: 25.0000 mg | ORAL_TABLET | Freq: Every evening | ORAL | 1 refills | Status: DC | PRN

## 2019-03-10 ENCOUNTER — Encounter: Payer: Self-pay | Admitting: Nurse Practitioner

## 2019-03-10 DIAGNOSIS — J0140 Acute pansinusitis, unspecified: Secondary | ICD-10-CM | POA: Insufficient documentation

## 2019-03-10 DIAGNOSIS — M2550 Pain in unspecified joint: Secondary | ICD-10-CM | POA: Insufficient documentation

## 2019-03-10 DIAGNOSIS — F5101 Primary insomnia: Secondary | ICD-10-CM | POA: Insufficient documentation

## 2019-03-10 DIAGNOSIS — F332 Major depressive disorder, recurrent severe without psychotic features: Secondary | ICD-10-CM | POA: Insufficient documentation

## 2019-03-26 ENCOUNTER — Ambulatory Visit: Payer: Medicaid Other | Admitting: Nurse Practitioner

## 2019-03-26 ENCOUNTER — Encounter: Payer: Self-pay | Admitting: Nurse Practitioner

## 2019-03-26 ENCOUNTER — Other Ambulatory Visit: Payer: Self-pay

## 2019-03-26 VITALS — Temp 97.8°F

## 2019-03-26 DIAGNOSIS — R059 Cough, unspecified: Secondary | ICD-10-CM

## 2019-03-26 DIAGNOSIS — J069 Acute upper respiratory infection, unspecified: Secondary | ICD-10-CM | POA: Diagnosis not present

## 2019-03-26 DIAGNOSIS — F332 Major depressive disorder, recurrent severe without psychotic features: Secondary | ICD-10-CM

## 2019-03-26 DIAGNOSIS — R05 Cough: Secondary | ICD-10-CM | POA: Diagnosis not present

## 2019-03-26 MED ORDER — METHYLPREDNISOLONE 4 MG PO TBPK: ORAL_TABLET | ORAL | 0 refills | Status: DC

## 2019-03-26 MED ORDER — ALBUTEROL SULFATE HFA 108 (90 BASE) MCG/ACT IN AERS: 2.0000 | INHALATION_SPRAY | Freq: Four times a day (QID) | RESPIRATORY_TRACT | 2 refills | Status: DC | PRN

## 2019-03-26 MED ORDER — LEVOFLOXACIN 500 MG PO TABS: 500.0000 mg | ORAL_TABLET | Freq: Every day | ORAL | 0 refills | Status: DC

## 2019-03-26 NOTE — Progress Notes (Signed)
[facility] [address] [place], [place] [address]  Internal MEDICINE  Telephone Visit  Patient Name: Andrea [name]  [id number]  [id number]  Date of Service: 03/26/2019  I connected with the patient at 8:48am  by webcam and verified the patients identity using two identifiers.   I discussed the limitations, risks, security and privacy concerns of performing an evaluation and management service by webcam and the availability of in person appointments. I also discussed with the patient that there may be a patient responsible charge related to the service.  The patient expressed understanding and agrees to proceed.    Chief Complaint  Patient presents with  . Telephone Assessment  . Telephone Screen  . Cough    hurt when she is cough right side   . Sinusitis    The patient has been contacted via webcam for follow up visit due to concerns for spread of novel coronavirus.  The patient is having chest congestion and cough. Hurting in right side of chest. This is worse with cough and exertion, and muscle movement. She has great deal of post nasal drip. She did take round of ceftin after her previous visit. She states that she feels some better, however, really still feels sick. She denies fever, chills, or headache. seh denies nausea, vomiting, or diarrhea.       Current Medication: Outpatient Encounter Medications as of 03/26/2019  Medication Sig  . aspirin-acetaminophen-caffeine (EXCEDRIN MIGRAINE) 250-250-65 MG tablet Take 2 tablets by mouth every 6 (six) hours as needed for headache or migraine.  . Brexpiprazole (REXULTI) 1 MG TABS Take 1 tablet (1 mg total) by mouth daily.  . busPIRone (BUSPAR) 30 MG tablet Take 1 tablet (30 mg total) by mouth 3 (three) times daily.  . cyclobenzaprine (FLEXERIL) 10 MG tablet Take 1 tablet (10 mg total) by mouth every 8 (eight) hours as needed for muscle spasms. (Patient taking differently: Take 30 mg by mouth at bedtime. )  . DULoxetine  (CYMBALTA) 60 MG capsule Take 1 capsule (60 mg total) by mouth 2 (two) times daily.  . fluconazole (DIFLUCAN) 150 MG tablet Take 1 tab po once and may repeat in 3 days if symptoms persist  . gabapentin (NEURONTIN) 600 MG tablet Take 2 tablets (1,200 mg total) by mouth 3 (three) times daily. (Patient taking differently: Take 3,600 mg by mouth at bedtime. )  . hydrOXYzine (ATARAX/VISTARIL) 25 MG tablet Take 1 to 2 tablets at night as needed for anxiety/insomnia  . ibuprofen (ADVIL,MOTRIN) 800 MG tablet Take 1 tablet (800 mg total) by mouth every 8 (eight) hours as needed for moderate pain.  Marland Kitchen. nystatin (NYSTATIN) powder Apply topically 4 (four) times daily.  . polyethylene glycol powder (GLYCOLAX/MIRALAX) powder Take 17 g by mouth daily.  . rizatriptan (MAXALT-MLT) 10 MG disintegrating tablet Take 1 tablet (10 mg total) by mouth as needed for migraine. May repeat in 2 hours if needed  . traZODone (DESYREL) 50 MG tablet Take 0.5-1 tablets (25-50 mg total) by mouth at bedtime as needed for sleep.  Marland Kitchen. albuterol (VENTOLIN HFA) 108 (90 Base) MCG/ACT inhaler Inhale 2 puffs into the lungs every 6 (six) hours as needed for wheezing or shortness of breath.  . cefUROXime (CEFTIN) 500 MG tablet Take 1 tablet (500 mg total) by mouth 2 (two) times daily with a meal. (Patient not taking: Reported on 03/26/2019)  . levofloxacin (LEVAQUIN) 500 MG tablet Take 1 tablet (500 mg total) by mouth daily.  . methylPREDNISolone (MEDROL) 4 MG TBPK  tablet Take by mouth as directed for 6 days   No facility-administered encounter medications on file as of 03/26/2019.     Surgical History: Past Surgical History:  Procedure Laterality Date  . CESAREAN SECTION N/A 04/24/2016   Procedure: CESAREAN SECTION;  Surgeon: Brock Bad, MD;  Location: [facility] BIRTHING SUITES;  Service: Obstetrics;  Laterality: N/A;  . CHOLECYSTECTOMY    . TONSILLECTOMY    . TONSILLECTOMY AND ADENOIDECTOMY      Medical History: Past Medical History:   Diagnosis Date  . Abnormal finding on MRI of brain 01/02/2017  . Anxiety and depression    see progress notes -sw  . Common migraine with intractable migraine 01/02/2017  . Polycystic ovarian disease   . Psoriasis   . Rosacea   . Seizure (HCC)    as child from trauma; no meds and no further seizures    Family History: Family History  Problem Relation Age of Onset  . Breast cancer Mother   . Bone cancer Mother   . Migraines Mother   . Colon cancer Father   . Prostate cancer Father   . Brain cancer Maternal Grandmother   . Migraines Maternal Grandmother   . Breast cancer Maternal Grandmother   . Migraines Sister     Social History   Socioeconomic History  . Marital status: Divorced    Spouse name: Not on file  . Number of children: 2  . Years of education: GED  . Highest education level: Not on file  Occupational History  . Occupation: N/A  Social Needs  . Financial resource strain: Not on file  . Food insecurity:    Worry: Not on file    Inability: Not on file  . Transportation needs:    Medical: Not on file    Non-medical: Not on file  Tobacco Use  . Smoking status: Current Some Day Smoker    Packs/day: 0.50    Years: 6.00    Pack years: 3.00    Types: Cigarettes  . Smokeless tobacco: Never Used  Substance and Sexual Activity  . Alcohol use: No  . Drug use: No  . Sexual activity: Yes    Birth control/protection: None  Lifestyle  . Physical activity:    Days per week: Not on file    Minutes per session: Not on file  . Stress: Not on file  Relationships  . Social connections:    Talks on phone: Not on file    Gets together: Not on file    Attends religious service: Not on file    Active member of club or organization: Not on file    Attends meetings of clubs or organizations: Not on file    Relationship status: Not on file  . Intimate partner violence:    Fear of current or ex partner: Not on file    Emotionally abused: Not on file    Physically  abused: Not on file    Forced sexual activity: Not on file  Other Topics Concern  . Not on file  Social History Narrative   Lives at home w/ her children   Right-handed   Caffeine: 1 cup coffee per day      Review of Systems  Constitutional: Positive for activity change, chills and fatigue. Negative for fever and unexpected weight change.  HENT: Positive for congestion, postnasal drip, rhinorrhea and sinus pressure. Negative for sinus pain, sneezing and sore throat.   Respiratory: Positive for cough and wheezing. Negative for chest  tightness and shortness of breath.   Cardiovascular: Negative for chest pain and palpitations.  Gastrointestinal: Negative for abdominal pain, constipation, diarrhea, nausea and vomiting.  Endocrine: Negative for cold intolerance, heat intolerance, polydipsia and polyuria.  Musculoskeletal: Negative for arthralgias, back pain, joint swelling and neck pain.  Skin: Negative for rash.  Allergic/Immunologic: Negative for environmental allergies.  Neurological: Positive for weakness and headaches. Negative for dizziness, tremors and numbness.  Hematological: Positive for adenopathy. Does not bruise/bleed easily.  Psychiatric/Behavioral: Positive for agitation, dysphoric mood and sleep disturbance. Negative for behavioral problems (Depression) and suicidal ideas. The patient is nervous/anxious.     Vital Signs: Temp 97.8 F (36.6 C)    Observation/Objective:  The patient is alert and oriented. In no acute distress. She is congested. Cough is deep and congested. Mild wheezing is noted.    Assessment/Plan: 1. Acute upper respiratory infection Start levofloxacin 500tablets daily. Rest and increase fluids. Continue using OTC medication as needed and as indicated for symptom improvement. Add medrol dose pack. Take as directed for 6 days.  - methylPREDNISolone (MEDROL) 4 MG TBPK tablet; Take by mouth as directed for 6 days  Dispense: 21 tablet; Refill: 0 -  levofloxacin (LEVAQUIN) 500 MG tablet; Take 1 tablet (500 mg total) by mouth daily.  Dispense: 10 tablet; Refill: 0  2. Cough Add medrol dose pack. Take as directed for 6 days. Start ventolin rescue inhaler. Use 2 puffs every 4 to 6 hours as needed for cough/wheezing. Will get chest x-ray if no improvement over next few days . - methylPREDNISolone (MEDROL) 4 MG TBPK tablet; Take by mouth as directed for 6 days  Dispense: 21 tablet; Refill: 0 - albuterol (VENTOLIN HFA) 108 (90 Base) MCG/ACT inhaler; Inhale 2 puffs into the lungs every 6 (six) hours as needed for wheezing or shortness of breath.  Dispense: 1 Inhaler; Refill: 2  3. Severe episode of recurrent major depressive disorder, without psychotic features (HCC) States impproved symptoms since starting rexulti, however, significant weight gain reported. Will discuss more at her next visit.   General Counseling: keerstin lingafelter understanding of the findings of today's phone visit and agrees with plan of treatment. I have discussed any further diagnostic evaluation that may be needed or ordered today. We also reviewed her medications today. she has been encouraged to call the office with any questions or concerns that should arise related to todays visit.  Rest and increase fluids. Continue using OTC medication to control symptoms.   This patient was seen by Vincent Gros FNP Collaboration with Dr Lyndon Code as a part of collaborative care agreement  Meds ordered this encounter  Medications  . methylPREDNISolone (MEDROL) 4 MG TBPK tablet    Sig: Take by mouth as directed for 6 days    Dispense:  21 tablet    Refill:  0    Order Specific Question:   Supervising Provider    Answer:   Lyndon Code [[id number]]  . albuterol (VENTOLIN HFA) 108 (90 Base) MCG/ACT inhaler    Sig: Inhale 2 puffs into the lungs every 6 (six) hours as needed for wheezing or shortness of breath.    Dispense:  1 Inhaler    Refill:  2    Order Specific Question:    Supervising Provider    Answer:   Lyndon Code [[id number]]  . levofloxacin (LEVAQUIN) 500 MG tablet    Sig: Take 1 tablet (500 mg total) by mouth daily.    Dispense:  10 tablet  Refill:  0    Order Specific Question:   Supervising Provider    Answer:   Lyndon Code [[id number]]    Time spent: 16 Minutes    Dr Lyndon Code Internal medicine

## 2019-04-06 ENCOUNTER — Encounter: Payer: Self-pay | Admitting: Nurse Practitioner

## 2019-04-06 ENCOUNTER — Other Ambulatory Visit: Payer: Self-pay

## 2019-04-06 ENCOUNTER — Ambulatory Visit: Payer: Medicaid Other | Admitting: Nurse Practitioner

## 2019-04-06 VITALS — Temp 97.9°F

## 2019-04-06 DIAGNOSIS — R05 Cough: Secondary | ICD-10-CM

## 2019-04-06 DIAGNOSIS — F332 Major depressive disorder, recurrent severe without psychotic features: Secondary | ICD-10-CM

## 2019-04-06 DIAGNOSIS — J069 Acute upper respiratory infection, unspecified: Secondary | ICD-10-CM

## 2019-04-06 DIAGNOSIS — R059 Cough, unspecified: Secondary | ICD-10-CM

## 2019-04-06 MED ORDER — LEVOFLOXACIN 500 MG PO TABS: 500.0000 mg | ORAL_TABLET | Freq: Every day | ORAL | 0 refills | Status: DC

## 2019-04-06 MED ORDER — BREXPIPRAZOLE 1 MG PO TABS: 1.0000 mg | ORAL_TABLET | Freq: Every day | ORAL | 3 refills | Status: DC

## 2019-04-06 MED ORDER — GUAIFENESIN ER 600 MG PO TB12: 1200.0000 mg | ORAL_TABLET | Freq: Two times a day (BID) | ORAL | 1 refills | Status: DC

## 2019-04-06 MED ORDER — PHENTERMINE HCL 37.5 MG PO TABS: 37.5000 mg | ORAL_TABLET | Freq: Every day | ORAL | 0 refills | Status: DC

## 2019-04-06 NOTE — Progress Notes (Signed)
[facility] [address] [place], [place] [address]  Internal MEDICINE  Telephone Visit  Patient Name: Andrea [name]  [id number]-Dec-[id number]  [id number]  Date of Service: 04/06/2019  I connected with the patient at 8:59am by webcam and verified the patients identity using two identifiers.   I discussed the limitations, risks, security and privacy concerns of performing an evaluation and management service by webcam and the availability of in person appointments. I also discussed with the patient that there may be a patient responsible charge related to the service.  The patient expressed understanding and agrees to proceed.    Chief Complaint  Patient presents with  . Telephone Assessment  . Telephone Screen  . Depression    The patient has been contacted via webcam for follow up visit due to concerns for spread of novel coronavirus. The patient was most recently seen for severe upper respiratory infection/bronchitis. She had completed ceftin at that point. Has now finished a round levaquin and medrol. She is feeling much better. Still has some episodes of cough and wheezing. Mucus can be thick and she has trouble getting it up. Mucus has foul taste.  She is doing well with depression and anxiety. Adding Rexulti really helped her with control. Major side effect has been weight gain, which is known potential side effect. She has been on phentermine in the past and did very well with it. Along with limiting calorie intake and increasing exercise, she should be able to lose weight without negative outcomes.       Current Medication: Outpatient Encounter Medications as of 04/06/2019  Medication Sig  . albuterol (VENTOLIN HFA) 108 (90 Base) MCG/ACT inhaler Inhale 2 puffs into the lungs every 6 (six) hours as needed for wheezing or shortness of breath.  Marland Kitchen. aspirin-acetaminophen-caffeine (EXCEDRIN MIGRAINE) 250-250-65 MG tablet Take 2 tablets by mouth every 6 (six) hours as needed for headache  or migraine.  . Brexpiprazole (REXULTI) 1 MG TABS Take 1 tablet (1 mg total) by mouth daily.  . busPIRone (BUSPAR) 30 MG tablet Take 1 tablet (30 mg total) by mouth 3 (three) times daily.  . cefUROXime (CEFTIN) 500 MG tablet Take 1 tablet (500 mg total) by mouth 2 (two) times daily with a meal.  . cyclobenzaprine (FLEXERIL) 10 MG tablet Take 1 tablet (10 mg total) by mouth every 8 (eight) hours as needed for muscle spasms. (Patient taking differently: Take 30 mg by mouth at bedtime. )  . DULoxetine (CYMBALTA) 60 MG capsule Take 1 capsule (60 mg total) by mouth 2 (two) times daily.  . fluconazole (DIFLUCAN) 150 MG tablet Take 1 tab po once and may repeat in 3 days if symptoms persist  . gabapentin (NEURONTIN) 600 MG tablet Take 2 tablets (1,200 mg total) by mouth 3 (three) times daily. (Patient taking differently: Take 3,600 mg by mouth at bedtime. )  . hydrOXYzine (ATARAX/VISTARIL) 25 MG tablet Take 1 to 2 tablets at night as needed for anxiety/insomnia  . ibuprofen (ADVIL,MOTRIN) 800 MG tablet Take 1 tablet (800 mg total) by mouth every 8 (eight) hours as needed for moderate pain.  Marland Kitchen. levofloxacin (LEVAQUIN) 500 MG tablet Take 1 tablet (500 mg total) by mouth daily.  . methylPREDNISolone (MEDROL) 4 MG TBPK tablet Take by mouth as directed for 6 days  . nystatin (NYSTATIN) powder Apply topically 4 (four) times daily.  . polyethylene glycol powder (GLYCOLAX/MIRALAX) powder Take 17 g by mouth daily.  . rizatriptan (MAXALT-MLT) 10 MG disintegrating tablet Take 1 tablet (10  mg total) by mouth as needed for migraine. May repeat in 2 hours if needed  . traZODone (DESYREL) 50 MG tablet Take 0.5-1 tablets (25-50 mg total) by mouth at bedtime as needed for sleep.  . [DISCONTINUED] Brexpiprazole (REXULTI) 1 MG TABS Take 1 tablet (1 mg total) by mouth daily.  . [DISCONTINUED] levofloxacin (LEVAQUIN) 500 MG tablet Take 1 tablet (500 mg total) by mouth daily.  Marland Kitchen guaiFENesin (MUCINEX) 600 MG 12 hr tablet Take 2  tablets (1,200 mg total) by mouth 2 (two) times daily.  . phentermine (ADIPEX-P) 37.5 MG tablet Take 1 tablet (37.5 mg total) by mouth daily before breakfast.   No facility-administered encounter medications on file as of 04/06/2019.     Surgical History: Past Surgical History:  Procedure Laterality Date  . CESAREAN SECTION N/A 04/24/2016   Procedure: CESAREAN SECTION;  Surgeon: Brock Bad, MD;  Location: [facility] BIRTHING SUITES;  Service: Obstetrics;  Laterality: N/A;  . CHOLECYSTECTOMY    . TONSILLECTOMY    . TONSILLECTOMY AND ADENOIDECTOMY      Medical History: Past Medical History:  Diagnosis Date  . Abnormal finding on MRI of brain 01/02/2017  . Anxiety and depression    see progress notes -sw  . Common migraine with intractable migraine 01/02/2017  . Polycystic ovarian disease   . Psoriasis   . Rosacea   . Seizure (HCC)    as child from trauma; no meds and no further seizures    Family History: Family History  Problem Relation Age of Onset  . Breast cancer Mother   . Bone cancer Mother   . Migraines Mother   . Colon cancer Father   . Prostate cancer Father   . Brain cancer Maternal Grandmother   . Migraines Maternal Grandmother   . Breast cancer Maternal Grandmother   . Migraines Sister     Social History   Socioeconomic History  . Marital status: Divorced    Spouse name: Not on file  . Number of children: 2  . Years of education: GED  . Highest education level: Not on file  Occupational History  . Occupation: N/A  Social Needs  . Financial resource strain: Not on file  . Food insecurity:    Worry: Not on file    Inability: Not on file  . Transportation needs:    Medical: Not on file    Non-medical: Not on file  Tobacco Use  . Smoking status: Current Some Day Smoker    Packs/day: 0.50    Years: 6.00    Pack years: 3.00    Types: Cigarettes  . Smokeless tobacco: Never Used  Substance and Sexual Activity  . Alcohol use: No  . Drug use: No  .  Sexual activity: Yes    Birth control/protection: None  Lifestyle  . Physical activity:    Days per week: Not on file    Minutes per session: Not on file  . Stress: Not on file  Relationships  . Social connections:    Talks on phone: Not on file    Gets together: Not on file    Attends religious service: Not on file    Active member of club or organization: Not on file    Attends meetings of clubs or organizations: Not on file    Relationship status: Not on file  . Intimate partner violence:    Fear of current or ex partner: Not on file    Emotionally abused: Not on file  Physically abused: Not on file    Forced sexual activity: Not on file  Other Topics Concern  . Not on file  Social History Narrative   Lives at home w/ her children   Right-handed   Caffeine: 1 cup coffee per day      Review of Systems  Constitutional: Positive for fatigue and unexpected weight change. Negative for activity change, chills and fever.       Concerned about weight gain since she has started Rexulti  HENT: Positive for congestion, postnasal drip and rhinorrhea. Negative for sinus pressure, sinus pain, sneezing and sore throat.   Respiratory: Positive for cough and wheezing. Negative for chest tightness and shortness of breath.        Improved since her last visit.   Cardiovascular: Negative for chest pain and palpitations.  Gastrointestinal: Negative for abdominal pain, constipation, diarrhea, nausea and vomiting.  Endocrine: Negative for cold intolerance, heat intolerance, polydipsia and polyuria.  Musculoskeletal: Negative for arthralgias, back pain, joint swelling and neck pain.  Skin: Negative for rash.  Allergic/Immunologic: Negative for environmental allergies.  Neurological: Positive for headaches. Negative for dizziness, tremors, weakness and numbness.  Hematological: Negative for adenopathy. Does not bruise/bleed easily.  Psychiatric/Behavioral: Positive for agitation, dysphoric  mood and sleep disturbance. Negative for behavioral problems (Depression) and suicidal ideas. The patient is nervous/anxious.        Improved since starting on rexulti    Today's Vitals   04/06/19 0841  Temp: 97.9 F (36.6 C)   There is no height or weight on file to calculate BMI.  Observation/Objective:  The patient is alert and oriented. She is pleasant and answers all questions appropriately. She still has intermittent, congested cough. No wheezing or labored breathing is appreciated today. She is in no acute distress at this time.    Assessment/Plan: 1. Acute upper respiratory infection Improving. Will continue levofloxacin  daily for 5 days. Continue OTC medication to reduce acute symptoms.  - levofloxacin (LEVAQUIN) 500 MG tablet; Take 1 tablet (500 mg total) by mouth daily.  Dispense: 7 tablet; Refill: 0  2. Cough mucinex  may be taken twice daily as needed for cough/congestion.  - guaiFENesin (MUCINEX) 600 MG 12 hr tablet; Take 2 tablets (1,200 mg total) by mouth 2 (two) times daily.  Dispense: 60 tablet; Refill: 1  3. Severe episode of recurrent major depressive disorder, without psychotic features (HCC) Much improved since starting on rexulti. Will continue this along with other medications for depression. Refilled rexulti today. - Brexpiprazole (REXULTI) 1 MG TABS; Take 1 tablet (1 mg total) by mouth daily.  Dispense: 30 tablet; Refill: 3  4. Morbid obesity (HCC) Weight gain associated with new medication. Start phentermine 37.5mg  tablets every day. Recommend she limit calorie intake to 1500 calories per day and gradually incorporate exercise into her daily routine.  - phentermine (ADIPEX-P) 37.5 MG tablet; Take 1 tablet (37.5 mg total) by mouth daily before breakfast.  Dispense: 30 tablet; Refill: 0  General Counseling: Keshara verbalizes understanding of the findings of today's phone visit and agrees with plan of treatment. I have discussed any further  diagnostic evaluation that may be needed or ordered today. We also reviewed her medications today. she has been encouraged to call the office with any questions or concerns that should arise related to todays visit.   There is a liability release in patients' chart. There has been a 10 minute discussion about the side effects including but not limited to elevated blood pressure, anxiety, lack  of sleep and dry mouth. Pt understands and will like to start/continue on appetite suppressant at this time. There will be one month RX given at the time of visit with proper follow up. Nova diet plan with restricted calories is given to the pt. Pt understands and agrees with  plan of treatment  This patient was seen by Vincent Gros FNP Collaboration with Dr Lyndon Code as a part of collaborative care agreement  Meds ordered this encounter  Medications  . phentermine (ADIPEX-P) 37.5 MG tablet    Sig: Take 1 tablet (37.5 mg total) by mouth daily before breakfast.    Dispense:  30 tablet    Refill:  0    Order Specific Question:   Supervising Provider    Answer:   Lyndon Code [[id number]]  . levofloxacin (LEVAQUIN) 500 MG tablet    Sig: Take 1 tablet (500 mg total) by mouth daily.    Dispense:  7 tablet    Refill:  0    Order Specific Question:   Supervising Provider    Answer:   Lyndon Code [[id number]]  . guaiFENesin (MUCINEX) 600 MG 12 hr tablet    Sig: Take 2 tablets (1,200 mg total) by mouth 2 (two) times daily.    Dispense:  60 tablet    Refill:  1    Please fill with generic alternative    Order Specific Question:   Supervising Provider    Answer:   Lyndon Code [[id number]]  . Brexpiprazole (REXULTI) 1 MG TABS    Sig: Take 1 tablet (1 mg total) by mouth daily.    Dispense:  30 tablet    Refill:  3    Patient given sample of starter pack today.    Order Specific Question:   Supervising Provider    Answer:   Lyndon Code [[id number]]    Time spent: 74 Minutes    Dr Lyndon Code Internal medicine

## 2019-04-08 ENCOUNTER — Other Ambulatory Visit: Payer: Self-pay

## 2019-04-08 ENCOUNTER — Telehealth: Payer: Self-pay

## 2019-04-08 MED ORDER — FLUCONAZOLE 150 MG PO TABS: ORAL_TABLET | ORAL | 0 refills | Status: DC

## 2019-04-08 NOTE — Telephone Encounter (Signed)
Pt called we put her on antibiotic and she need diflucan

## 2019-04-09 ENCOUNTER — Other Ambulatory Visit: Payer: Self-pay | Admitting: Neurosurgery

## 2019-04-09 DIAGNOSIS — M542 Cervicalgia: Secondary | ICD-10-CM

## 2019-04-13 ENCOUNTER — Other Ambulatory Visit: Payer: Self-pay

## 2019-04-13 DIAGNOSIS — F411 Generalized anxiety disorder: Secondary | ICD-10-CM

## 2019-04-13 DIAGNOSIS — G43019 Migraine without aura, intractable, without status migrainosus: Secondary | ICD-10-CM

## 2019-04-13 MED ORDER — RIZATRIPTAN BENZOATE 10 MG PO TBDP: 10.0000 mg | ORAL_TABLET | ORAL | 3 refills | Status: DC | PRN

## 2019-04-13 MED ORDER — HYDROXYZINE HCL 25 MG PO TABS: ORAL_TABLET | ORAL | 2 refills | Status: DC

## 2019-04-30 ENCOUNTER — Ambulatory Visit: Payer: Self-pay | Admitting: Nurse Practitioner

## 2019-05-04 ENCOUNTER — Other Ambulatory Visit: Payer: Self-pay

## 2019-05-04 ENCOUNTER — Encounter: Payer: Self-pay | Admitting: Nurse Practitioner

## 2019-05-04 ENCOUNTER — Ambulatory Visit: Payer: Medicaid Other | Admitting: Nurse Practitioner

## 2019-05-04 VITALS — Ht 67.0 in | Wt 270.0 lb

## 2019-05-04 DIAGNOSIS — R5383 Other fatigue: Secondary | ICD-10-CM

## 2019-05-04 DIAGNOSIS — F332 Major depressive disorder, recurrent severe without psychotic features: Secondary | ICD-10-CM

## 2019-05-04 MED ORDER — PHENTERMINE HCL 37.5 MG PO TABS: 37.5000 mg | ORAL_TABLET | Freq: Every day | ORAL | 0 refills | Status: DC

## 2019-05-04 NOTE — Progress Notes (Signed)
[facility] [address] [place], [place] [address]  Internal MEDICINE  Telephone Visit  Patient Name: Andrea [name]  [id number] 02, 1982  [id number]  Date of Service: 05/12/2019  I connected with the patient at 12:15pm by telephone and verified the patients identity using two identifiers.   I discussed the limitations, risks, security and privacy concerns of performing an evaluation and management service by telephone and the availability of in person appointments. I also discussed with the patient that there may be a patient responsible charge related to the service.  The patient expressed understanding and agrees to proceed.    Chief Complaint  Patient presents with  . Telephone Screen    PHONE VISIT [phone]  . Telephone Assessment  . Medical Management of Chronic Issues    4wk follow up, weight management, medication concerns possible medication change not lioking the outcome of the medication    .The patient has been contacted via telephone for follow up visit due to concerns for spread of novel coronavirus. The patient is very concerned about negative side effects related to rexulti. Though this medication has helped a great deal with her depression and anxiety, it has caused her a great deal of weight gain and she is very unhappy with this side effect. We did start her on phentermine at last visit. She feels as though she has not had too much progress. She is limiting her calorie intake to 1500 calories per day and she is currently exercising three days a week for at least 30 minutes.       Current Medication: Outpatient Encounter Medications as of 05/04/2019  Medication Sig  . albuterol (VENTOLIN HFA) 108 (90 Base) MCG/ACT inhaler Inhale 2 puffs into the lungs every 6 (six) hours as needed for wheezing or shortness of breath.  Marland Kitchen. aspirin-acetaminophen-caffeine (EXCEDRIN MIGRAINE) 250-250-65 MG tablet Take 2 tablets by mouth every 6 (six) hours as needed for headache or  migraine.  . Brexpiprazole (REXULTI) 1 MG TABS Take 1 tablet (1 mg total) by mouth daily.  . busPIRone (BUSPAR) 30 MG tablet Take 1 tablet (30 mg total) by mouth 3 (three) times daily.  . cyclobenzaprine (FLEXERIL) 10 MG tablet Take 1 tablet (10 mg total) by mouth every 8 (eight) hours as needed for muscle spasms. (Patient taking differently: Take 30 mg by mouth at bedtime. )  . DULoxetine (CYMBALTA) 60 MG capsule Take 1 capsule (60 mg total) by mouth 2 (two) times daily.  Marland Kitchen. gabapentin (NEURONTIN) 600 MG tablet Take 2 tablets (1,200 mg total) by mouth 3 (three) times daily. (Patient taking differently: Take 3,600 mg by mouth at bedtime. )  . guaiFENesin (MUCINEX) 600 MG 12 hr tablet Take 2 tablets (1,200 mg total) by mouth 2 (two) times daily.  . hydrOXYzine (ATARAX/VISTARIL) 25 MG tablet Take 1 to 2 tablets at night as needed for anxiety/insomnia  . ibuprofen (ADVIL,MOTRIN) 800 MG tablet Take 1 tablet (800 mg total) by mouth every 8 (eight) hours as needed for moderate pain.  Marland Kitchen. levofloxacin (LEVAQUIN) 500 MG tablet Take 1 tablet (500 mg total) by mouth daily.  . methylPREDNISolone (MEDROL) 4 MG TBPK tablet Take by mouth as directed for 6 days  . nystatin (NYSTATIN) powder Apply topically 4 (four) times daily.  . phentermine (ADIPEX-P) 37.5 MG tablet Take 1 tablet (37.5 mg total) by mouth daily before breakfast.  . polyethylene glycol powder (GLYCOLAX/MIRALAX) powder Take 17 g by mouth daily.  . rizatriptan (MAXALT-MLT) 10 MG disintegrating tablet Take 1 tablet (10  mg total) by mouth as needed for migraine. May repeat in 2 hours if needed  . traZODone (DESYREL) 50 MG tablet Take 0.5-1 tablets (25-50 mg total) by mouth at bedtime as needed for sleep.  . [DISCONTINUED] phentermine (ADIPEX-P) 37.5 MG tablet Take 1 tablet (37.5 mg total) by mouth daily before breakfast.  . [DISCONTINUED] cefUROXime (CEFTIN) 500 MG tablet Take 1 tablet (500 mg total) by mouth 2 (two) times daily with a meal. (Patient not  taking: Reported on 05/04/2019)  . [DISCONTINUED] fluconazole (DIFLUCAN) 150 MG tablet Take 1 tab po once and may repeat in 3 days if symptoms persist (Patient not taking: Reported on 05/04/2019)   No facility-administered encounter medications on file as of 05/04/2019.     Surgical History: Past Surgical History:  Procedure Laterality Date  . CESAREAN SECTION N/A 04/24/2016   Procedure: CESAREAN SECTION;  Surgeon: Brock Bad, MD;  Location: [facility] BIRTHING SUITES;  Service: Obstetrics;  Laterality: N/A;  . CHOLECYSTECTOMY    . TONSILLECTOMY    . TONSILLECTOMY AND ADENOIDECTOMY      Medical History: Past Medical History:  Diagnosis Date  . Abnormal finding on MRI of brain 01/02/2017  . Anxiety and depression    see progress notes -sw  . Common migraine with intractable migraine 01/02/2017  . Polycystic ovarian disease   . Psoriasis   . Rosacea   . Seizure (HCC)    as child from trauma; no meds and no further seizures    Family History: Family History  Problem Relation Age of Onset  . Breast cancer Mother   . Bone cancer Mother   . Migraines Mother   . Colon cancer Father   . Prostate cancer Father   . Brain cancer Maternal Grandmother   . Migraines Maternal Grandmother   . Breast cancer Maternal Grandmother   . Migraines Sister     Social History   Socioeconomic History  . Marital status: Divorced    Spouse name: Not on file  . Number of children: 2  . Years of education: GED  . Highest education level: Not on file  Occupational History  . Occupation: N/A  Social Needs  . Financial resource strain: Not on file  . Food insecurity:    Worry: Not on file    Inability: Not on file  . Transportation needs:    Medical: Not on file    Non-medical: Not on file  Tobacco Use  . Smoking status: Current Some Day Smoker    Packs/day: 0.50    Years: 6.00    Pack years: 3.00    Types: Cigarettes  . Smokeless tobacco: Never Used  Substance and Sexual Activity  . Alcohol  use: No  . Drug use: No  . Sexual activity: Yes    Birth control/protection: None  Lifestyle  . Physical activity:    Days per week: Not on file    Minutes per session: Not on file  . Stress: Not on file  Relationships  . Social connections:    Talks on phone: Not on file    Gets together: Not on file    Attends religious service: Not on file    Active member of club or organization: Not on file    Attends meetings of clubs or organizations: Not on file    Relationship status: Not on file  . Intimate partner violence:    Fear of current or ex partner: Not on file    Emotionally abused: Not on file  Physically abused: Not on file    Forced sexual activity: Not on file  Other Topics Concern  . Not on file  Social History Narrative   Lives at home w/ her children   Right-handed   Caffeine: 1 cup coffee per day      Review of Systems  Constitutional: Positive for fatigue and unexpected weight change. Negative for activity change, chills and fever.       Concerned about weight gain since she has started Rexulti  HENT: Negative for congestion, postnasal drip, rhinorrhea, sinus pressure, sinus pain, sneezing and sore throat.   Respiratory: Negative for cough, chest tightness, shortness of breath and wheezing.        Improved since her last visit.   Cardiovascular: Negative for chest pain and palpitations.  Gastrointestinal: Negative for abdominal pain, constipation, diarrhea, nausea and vomiting.  Endocrine: Negative for cold intolerance, heat intolerance, polydipsia and polyuria.  Musculoskeletal: Negative for arthralgias, back pain, joint swelling and neck pain.  Skin: Negative for rash.  Allergic/Immunologic: Negative for environmental allergies.  Neurological: Positive for headaches. Negative for dizziness, tremors, weakness and numbness.  Hematological: Negative for adenopathy. Does not bruise/bleed easily.  Psychiatric/Behavioral: Positive for agitation, dysphoric mood  and sleep disturbance. Negative for behavioral problems (Depression) and suicidal ideas. The patient is nervous/anxious.        Improved since starting on rexulti   Today's Vitals   05/04/19 1159  Weight: 270 lb (122.5 kg)  Height:  (1.702 m)   Body mass index is 42.29 kg/m.  Observation/Objective:   The patient is alert and oriented. She is pleasant and answers all questions appropriately. Breathing is non-labored. She is in no acute distress at this time.    Assessment/Plan: 1. Other fatigue Likely related to increased weight and severe depression. Will monitor.  2. Severe episode of recurrent major depressive disorder, without psychotic features (HCC) No changes made to psychiatric medication today. Referral made to psychiatry for further evaluation and treatment.  - Ambulatory referral to Psychiatry  3. Morbid obesity (HCC) Will continue phentermine for now. Limit calorie intake to 1500 calories per day and gradually increase exercise as tolerated.   - phentermine (ADIPEX-P) 37.5 MG tablet; Take 1 tablet (37.5 mg total) by mouth daily before breakfast.  Dispense: 30 tablet; Refill: 0  General Counseling: Evette verbalizes understanding of the findings of today's phone visit and agrees with plan of treatment. I have discussed any further diagnostic evaluation that may be needed or ordered today. We also reviewed her medications today. she has been encouraged to call the office with any questions or concerns that should arise related to todays visit.   There is a liability release in patients' chart. There has been a 10 minute discussion about the side effects including but not limited to elevated blood pressure, anxiety, lack of sleep and dry mouth. Pt understands and will like to start/continue on appetite suppressant at this time. There will be one month RX given at the time of visit with proper follow up. Nova diet plan with restricted calories is given to the pt. Pt  understands and agrees with  plan of treatment  This patient was seen by Vincent Gros FNP Collaboration with Dr Lyndon Code as a part of collaborative care agreement  Orders Placed This Encounter  Procedures  . Ambulatory referral to Psychiatry    Meds ordered this encounter  Medications  . phentermine (ADIPEX-P) 37.5 MG tablet    Sig: Take 1 tablet (37.5 mg  total) by mouth daily before breakfast.    Dispense:  30 tablet    Refill:  0    Order Specific Question:   Supervising Provider    Answer:   Lyndon Code [[id number]]    Time spent: 35 Minutes    Dr Lyndon Code Internal medicine

## 2019-05-18 ENCOUNTER — Other Ambulatory Visit: Payer: Medicaid Other

## 2019-11-01 ENCOUNTER — Encounter: Payer: Self-pay | Admitting: Nurse Practitioner

## 2020-02-28 ENCOUNTER — Telehealth (HOSPITAL_COMMUNITY): Payer: Self-pay | Admitting: *Deleted

## 2020-02-29 NOTE — Telephone Encounter (Signed)
Preadmission screen  

## 2020-03-02 ENCOUNTER — Telehealth: Payer: Self-pay

## 2020-03-02 NOTE — Telephone Encounter (Signed)
CONFIRMED AND SCREENED FOR 03-07-20 OV. 

## 2020-03-07 ENCOUNTER — Ambulatory Visit: Payer: Medicaid Other | Admitting: Nurse Practitioner

## 2020-03-07 ENCOUNTER — Other Ambulatory Visit (HOSPITAL_COMMUNITY): Payer: Medicaid Other

## 2020-03-09 ENCOUNTER — Inpatient Hospital Stay (HOSPITAL_COMMUNITY)
Admission: AD | Admit: 2020-03-09 | Payer: Medicaid Other | Source: Home / Self Care | Admitting: Obstetrics & Gynecology

## 2020-03-09 ENCOUNTER — Inpatient Hospital Stay (HOSPITAL_COMMUNITY): Payer: Medicaid Other

## 2020-03-10 ENCOUNTER — Telehealth: Payer: Self-pay

## 2020-03-10 NOTE — Telephone Encounter (Signed)
Called lmom informing patient of appointment on 03/14/2020. klh 

## 2020-03-14 ENCOUNTER — Ambulatory Visit: Payer: Medicaid Other | Admitting: Adult Health

## 2020-03-14 ENCOUNTER — Encounter: Payer: Self-pay | Admitting: Adult Health

## 2020-03-14 ENCOUNTER — Other Ambulatory Visit: Payer: Self-pay

## 2020-03-14 VITALS — BP 128/81 | HR 77 | Temp 98.5°F | Resp 16 | Ht 67.0 in | Wt 206.0 lb

## 2020-03-14 DIAGNOSIS — F321 Major depressive disorder, single episode, moderate: Secondary | ICD-10-CM | POA: Diagnosis not present

## 2020-03-14 DIAGNOSIS — F32A Depression, unspecified: Secondary | ICD-10-CM

## 2020-03-14 DIAGNOSIS — F329 Major depressive disorder, single episode, unspecified: Secondary | ICD-10-CM

## 2020-03-14 DIAGNOSIS — B373 Candidiasis of vulva and vagina: Secondary | ICD-10-CM | POA: Diagnosis not present

## 2020-03-14 DIAGNOSIS — G43019 Migraine without aura, intractable, without status migrainosus: Secondary | ICD-10-CM | POA: Diagnosis not present

## 2020-03-14 DIAGNOSIS — G43009 Migraine without aura, not intractable, without status migrainosus: Secondary | ICD-10-CM

## 2020-03-14 DIAGNOSIS — G8929 Other chronic pain: Secondary | ICD-10-CM

## 2020-03-14 DIAGNOSIS — Z7689 Persons encountering health services in other specified circumstances: Secondary | ICD-10-CM

## 2020-03-14 DIAGNOSIS — F411 Generalized anxiety disorder: Secondary | ICD-10-CM

## 2020-03-14 DIAGNOSIS — M5441 Lumbago with sciatica, right side: Secondary | ICD-10-CM

## 2020-03-14 DIAGNOSIS — R102 Pelvic and perineal pain: Secondary | ICD-10-CM

## 2020-03-14 DIAGNOSIS — B3731 Acute candidiasis of vulva and vagina: Secondary | ICD-10-CM

## 2020-03-14 DIAGNOSIS — M5442 Lumbago with sciatica, left side: Secondary | ICD-10-CM

## 2020-03-14 DIAGNOSIS — R300 Dysuria: Secondary | ICD-10-CM

## 2020-03-14 LAB — POCT URINALYSIS DIPSTICK
Bilirubin, UA: NEGATIVE
Blood, UA: NEGATIVE
Glucose, UA: NEGATIVE
Ketones, UA: NEGATIVE
Leukocytes, UA: NEGATIVE
Nitrite, UA: NEGATIVE
Protein, UA: NEGATIVE
Spec Grav, UA: 1.01 (ref 1.010–1.025)
Urobilinogen, UA: 0.2 E.U./dL
pH, UA: 7 (ref 5.0–8.0)

## 2020-03-14 MED ORDER — CYCLOBENZAPRINE HCL 10 MG PO TABS: 10.0000 mg | ORAL_TABLET | Freq: Three times a day (TID) | ORAL | 2 refills | Status: DC | PRN

## 2020-03-14 MED ORDER — AIMOVIG 70 MG/ML ~~LOC~~ SOAJ: 70.0000 mg | SUBCUTANEOUS | 5 refills | Status: DC

## 2020-03-14 MED ORDER — MELOXICAM 15 MG PO TABS: 15.0000 mg | ORAL_TABLET | Freq: Every day | ORAL | 1 refills | Status: DC

## 2020-03-14 MED ORDER — PAROXETINE HCL 40 MG PO TABS: 40.0000 mg | ORAL_TABLET | Freq: Every day | ORAL | 1 refills | Status: DC

## 2020-03-14 MED ORDER — TRAZODONE HCL 100 MG PO TABS: 100.0000 mg | ORAL_TABLET | Freq: Every day | ORAL | 2 refills | Status: DC

## 2020-03-14 MED ORDER — HYDROXYZINE PAMOATE 50 MG PO CAPS: 50.0000 mg | ORAL_CAPSULE | Freq: Two times a day (BID) | ORAL | 2 refills | Status: DC | PRN

## 2020-03-14 MED ORDER — RIZATRIPTAN BENZOATE 10 MG PO TBDP: 10.0000 mg | ORAL_TABLET | ORAL | 3 refills | Status: DC | PRN

## 2020-03-14 MED ORDER — GABAPENTIN 600 MG PO TABS: 600.0000 mg | ORAL_TABLET | Freq: Three times a day (TID) | ORAL | 1 refills | Status: DC

## 2020-03-14 MED ORDER — DULOXETINE HCL 60 MG PO CPEP: 60.0000 mg | ORAL_CAPSULE | Freq: Two times a day (BID) | ORAL | 3 refills | Status: DC

## 2020-03-14 MED ORDER — FLUCONAZOLE 150 MG PO TABS: 150.0000 mg | ORAL_TABLET | ORAL | 0 refills | Status: DC | PRN

## 2020-03-14 MED ORDER — BUSPIRONE HCL 30 MG PO TABS: 30.0000 mg | ORAL_TABLET | Freq: Two times a day (BID) | ORAL | 3 refills | Status: DC
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
## 2020-03-20 LAB — CBC WITH DIFFERENTIAL/PLATELET
Basophils Absolute: 0.1 10*3/uL (ref 0.0–0.2)
Basos: 0 %
EOS (ABSOLUTE): 0.2 10*3/uL (ref 0.0–0.4)
Eos: 1 %
Hematocrit: 43.4 % (ref 34.0–46.6)
Hemoglobin: 14.8 g/dL (ref 11.1–15.9)
Immature Grans (Abs): 0 10*3/uL (ref 0.0–0.1)
Immature Granulocytes: 0 %
Lymphocytes Absolute: 3.7 10*3/uL — ABNORMAL HIGH (ref 0.7–3.1)
Lymphs: 26 %
MCH: 29.9 pg (ref 26.6–33.0)
MCHC: 34.1 g/dL (ref 31.5–35.7)
MCV: 88 fL (ref 79–97)
Monocytes Absolute: 0.6 10*3/uL (ref 0.1–0.9)
Monocytes: 4 %
Neutrophils Absolute: 9.7 10*3/uL — ABNORMAL HIGH (ref 1.4–7.0)
Neutrophils: 69 %
Platelets: 298 10*3/uL (ref 150–450)
RBC: 4.95 x10E6/uL (ref 3.77–5.28)
RDW: 13.2 % (ref 11.7–15.4)
WBC: 14.3 10*3/uL — ABNORMAL HIGH (ref 3.4–10.8)

## 2020-03-20 LAB — COMPREHENSIVE METABOLIC PANEL
ALT: 14 IU/L (ref 0–32)
AST: 17 IU/L (ref 0–40)
Albumin/Globulin Ratio: 2.2 (ref 1.2–2.2)
Albumin: 4.3 g/dL (ref 3.8–4.8)
Alkaline Phosphatase: 91 IU/L (ref 39–117)
BUN/Creatinine Ratio: 9 (ref 9–23)
BUN: 7 mg/dL (ref 6–20)
Bilirubin Total: 0.2 mg/dL (ref 0.0–1.2)
CO2: 22 mmol/L (ref 20–29)
Calcium: 9.5 mg/dL (ref 8.7–10.2)
Chloride: 103 mmol/L (ref 96–106)
Creatinine, Ser: 0.78 mg/dL (ref 0.57–1.00)
GFR calc Af Amer: 112 mL/min/{1.73_m2} (ref >59)
GFR calc non Af Amer: 97 mL/min/{1.73_m2} (ref >59)
Globulin, Total: 2 g/dL (ref 1.5–4.5)
Glucose: 75 mg/dL (ref 65–99)
Potassium: 3.7 mmol/L (ref 3.5–5.2)
Sodium: 141 mmol/L (ref 134–144)
Total Protein: 6.3 g/dL (ref 6.0–8.5)

## 2020-03-20 LAB — LIPID PANEL WITH LDL/HDL RATIO
Cholesterol, Total: 174 mg/dL (ref 100–199)
HDL: 46 mg/dL (ref >39)
LDL Chol Calc (NIH): 97 mg/dL (ref 0–99)
LDL/HDL Ratio: 2.1 ratio (ref 0.0–3.2)
Triglycerides: 178 mg/dL — ABNORMAL HIGH (ref 0–149)
VLDL Cholesterol Cal: 31 mg/dL (ref 5–40)

## 2020-03-20 LAB — TSH: TSH: 2.71 u[IU]/mL (ref 0.450–4.500)

## 2020-03-20 LAB — B12 AND FOLATE PANEL
Folate: 10.4 ng/mL (ref >3.0)
Vitamin B-12: 721 pg/mL (ref 232–1245)

## 2020-03-20 LAB — T4, FREE: Free T4: 1.06 ng/dL (ref 0.82–1.77)

## 2020-03-20 LAB — VITAMIN B1: Thiamine: 265.5 nmol/L — ABNORMAL HIGH (ref 66.5–200.0)

## 2020-03-21 ENCOUNTER — Encounter: Payer: Self-pay | Admitting: Nurse Practitioner

## 2020-03-21 ENCOUNTER — Ambulatory Visit: Payer: Medicaid Other | Admitting: Nurse Practitioner

## 2020-03-21 VITALS — Ht 67.0 in | Wt 206.0 lb

## 2020-03-21 DIAGNOSIS — R9082 White matter disease, unspecified: Secondary | ICD-10-CM | POA: Diagnosis not present

## 2020-03-21 DIAGNOSIS — F332 Major depressive disorder, recurrent severe without psychotic features: Secondary | ICD-10-CM | POA: Diagnosis not present

## 2020-03-21 DIAGNOSIS — G950 Syringomyelia and syringobulbia: Secondary | ICD-10-CM

## 2020-03-21 DIAGNOSIS — R531 Weakness: Secondary | ICD-10-CM | POA: Diagnosis not present

## 2020-03-21 NOTE — Progress Notes (Signed)
[facility] [place], [place] [address]  Internal MEDICINE  Telephone Visit  Patient Name: Andrea [name]  [id number]  [id number]  Date of Service: 04/02/2020  I connected with the patient at 4:38pm by webcam and verified the patients identity using two identifiers.   I discussed the limitations, risks, security and privacy concerns of performing an evaluation and management service by webcam and the availability of in person appointments. I also discussed with the patient that there may be a patient responsible charge related to the service.  The patient expressed understanding and agrees to proceed.    Chief Complaint  Patient presents with  . Telephone Assessment  . Telephone Screen  . Fall    passed out and fell face first  . Referral    neurologist    The patient has been contacted via webcam for follow up visit due to concerns for spread of novel coronavirus. She presents for a sick visit. Recently moved back to [place] from [place]. Over the last several months, she states that she has had a few episodes of dizziness and then suddenly loses consciousness. This happened once to her over the weekend. She hit her left eye and shoulder and has had some back pain. When she was living in [place]. They did imaging of the head and cervical spine. 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 She does need to have a new referral to neurologist here so she can have continued monitoring and care. While in [place], she was diagnosed with fibromyalgia, diagnosed with Syrinx, and started on preventive shot of aiimovig to help with migraine prevention. She also saw mental health in [place], she was diagnosed with PTSD and severe anxiety. Would like to see a counselor at some point in the future, but right now, she has so much going on, she would like to hold off on this  for now. She is feeling well on her current medications.       Current Medication: Outpatient Encounter Medications as of 03/21/2020  Medication Sig  . albuterol (VENTOLIN HFA) 108 (90 Base) MCG/ACT inhaler Inhale 2 puffs into the lungs every 6 (six) hours as needed for wheezing or shortness of breath.  Marland Kitchen aspirin-acetaminophen-caffeine (EXCEDRIN MIGRAINE) 250-250-65 MG tablet Take 2 tablets by mouth every 6 (six) hours as needed for headache or migraine.  . busPIRone (BUSPAR) 30 MG tablet Take 1 tablet (30 mg total) by mouth in the morning and at bedtime.  . cyclobenzaprine (FLEXERIL) 10 MG tablet Take 1 tablet (10 mg total) by mouth every 8 (eight) hours as needed for muscle spasms.  . DULoxetine (CYMBALTA) 60 MG capsule Take 1 capsule (60 mg total) by mouth 2 (two) times daily.  Eduard Roux (AIMOVIG) 70 MG/ML SOAJ Inject 70 mg into the skin every 30 (thirty) days. Once a month  . fluconazole (DIFLUCAN) 150 MG tablet Take 1 tablet (150 mg total) by mouth every three (3) days as needed.  . gabapentin (NEURONTIN) 600 MG tablet Take 1 tablet (600 mg total) by mouth 3 (three) times daily.  . hydrOXYzine (VISTARIL) 50 MG capsule Take 1 capsule (50 mg total) by mouth 2 (two) times daily as needed.  . meloxicam (MOBIC) 15 MG tablet Take 1 tablet (15 mg total) by mouth daily.  Marland Kitchen PARoxetine (PAXIL) 40 MG tablet Take 1 tablet (40 mg total) by mouth at bedtime.  Marland Kitchen  rizatriptan (MAXALT-MLT) 10 MG disintegrating tablet Take 1 tablet (10 mg total) by mouth as needed for migraine. May repeat in 2 hours if needed  . traZODone (DESYREL) 100 MG tablet Take 1-2 tablets (100-200 mg total) by mouth at bedtime. 1-2 tab at bedtime   No facility-administered encounter medications on file as of 03/21/2020.    Surgical History: Past Surgical History:  Procedure Laterality Date  . CESAREAN SECTION N/A 04/24/2016   Procedure: CESAREAN SECTION;  Surgeon: Brock Bad, MD;  Location: [facility] BIRTHING SUITES;   Service: Obstetrics;  Laterality: N/A;  . CHOLECYSTECTOMY    . TONSILLECTOMY    . TONSILLECTOMY AND ADENOIDECTOMY      Medical History: Past Medical History:  Diagnosis Date  . Abnormal finding on MRI of brain 01/02/2017  . Anxiety and depression    see progress notes -sw  . Common migraine with intractable migraine 01/02/2017  . Polycystic ovarian disease   . Psoriasis   . Rosacea   . Seizure (HCC)    as child from trauma; no meds and no further seizures    Family History: Family History  Problem Relation Age of Onset  . Breast cancer Mother   . Bone cancer Mother   . Migraines Mother   . Colon cancer Father   . Prostate cancer Father   . Brain cancer Maternal Grandmother   . Migraines Maternal Grandmother   . Breast cancer Maternal Grandmother   . Migraines Sister     Social History   Socioeconomic History  . Marital status: Divorced    Spouse name: Not on file  . Number of children: 2  . Years of education: GED  . Highest education level: Not on file  Occupational History  . Occupation: N/A  Tobacco Use  . Smoking status: Current Some Day Smoker    Packs/day: 0.50    Years: 6.00    Pack years: 3.00    Types: Cigarettes  . Smokeless tobacco: Never Used  Substance and Sexual Activity  . Alcohol use: No  . Drug use: No  . Sexual activity: Yes    Birth control/protection: None  Other Topics Concern  . Not on file  Social History Narrative   Lives at home w/ her children   Right-handed   Caffeine: 1 cup coffee per day   Social Determinants of Health   Financial Resource Strain:   . Difficulty of Paying Living Expenses:   Food Insecurity:   . Worried About Programme researcher, broadcasting/film/video in the Last Year:   . Barista in the Last Year:   Transportation Needs:   . Freight forwarder (Medical):   Marland Kitchen Lack of Transportation (Non-Medical):   Physical Activity:   . Days of Exercise per Week:   . Minutes of Exercise per Session:   Stress:   . Feeling of  Stress :   Social Connections:   . Frequency of Communication with Friends and Family:   . Frequency of Social Gatherings with Friends and Family:   . Attends Religious Services:   . Active Member of Clubs or Organizations:   . Attends Banker Meetings:   Marland Kitchen Marital Status:   Intimate Partner Violence:   . Fear of Current or Ex-Partner:   . Emotionally Abused:   Marland Kitchen Physically Abused:   . Sexually Abused:       Review of Systems  Constitutional: Positive for activity change and fatigue. Negative for chills and unexpected weight change.  HENT: Negative for congestion, postnasal drip, rhinorrhea, sneezing and sore throat.   Respiratory: Negative for cough, chest tightness, shortness of breath and wheezing.   Cardiovascular: Negative for chest pain and palpitations.  Gastrointestinal: Negative for abdominal pain, constipation, diarrhea, nausea and vomiting.  Endocrine: Negative for cold intolerance, heat intolerance, polydipsia and polyuria.  Musculoskeletal: Positive for arthralgias, back pain and myalgias. Negative for joint swelling and neck pain.  Skin: Negative for rash.  Allergic/Immunologic: Negative for environmental allergies.  Neurological: Positive for dizziness, syncope, weakness and headaches. Negative for tremors and numbness.  Hematological: Negative for adenopathy. Does not bruise/bleed easily.  Psychiatric/Behavioral: Positive for dysphoric mood. Negative for behavioral problems (Depression), sleep disturbance and suicidal ideas. The patient is nervous/anxious.     Today's Vitals   03/21/20 1611  Weight: 206 lb (93.4 kg)  Height: 5\' 7"  (1.702 m)   Body mass index is 32.26 kg/m.  Observation/Objective:   The patient is alert and oriented. She is pleasant and answers all questions appropriately. Breathing is non-labored. She is in no acute distress at this time.    Assessment/Plan: 1. White matter 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 Patient with history  of white mater abnormalities. Needs new referral to neurology for further evaluation and treatment.  - Ambulatory referral to Neurology  2. Syrinx of spinal cord [facility]) Patient diagnosed with Syrinx of cervical spine while [place]. Will get medical records from previous provider. New referral to neurology made for further evaluation.  - Ambulatory referral to Neurology  3. Weakness Likely related to white matter disease. Refer to neurology for further evaluation.   4. Severe episode of recurrent major depressive disorder, without psychotic features (HCC) Continue with antidepressant therapy as previously prescribed   General Counseling: Luwana verbalizes understanding of the findings of today's phone visit and agrees with plan of treatment. I have discussed any further diagnostic evaluation that may be needed or ordered today. We also reviewed her medications today. she has been encouraged to call the office with any questions or concerns that should arise related to todays visit.  This patient was seen by Shanda Bumps FNP Collaboration with Dr Vincent Gros as a part of collaborative care agreement  Orders Placed This Encounter  Procedures  . Ambulatory referral to Neurology     Time spent: 91 Minutes    Dr 26 Internal medicine

## 2020-03-22 ENCOUNTER — Emergency Department: Payer: Medicaid Other

## 2020-03-22 ENCOUNTER — Other Ambulatory Visit: Payer: Self-pay

## 2020-03-22 ENCOUNTER — Emergency Department
Admission: EM | Admit: 2020-03-22 | Discharge: 2020-03-22 | Disposition: A | Discharge status: Home / Self Care | Payer: Medicaid Other | Attending: Emergency Medicine | Admitting: Emergency Medicine

## 2020-03-22 ENCOUNTER — Encounter: Payer: Self-pay | Admitting: Emergency Medicine

## 2020-03-22 DIAGNOSIS — F17210 Nicotine dependence, cigarettes, uncomplicated: Secondary | ICD-10-CM | POA: Diagnosis not present

## 2020-03-22 DIAGNOSIS — S0285XA Fracture of orbit, unspecified, initial encounter for closed fracture: Secondary | ICD-10-CM | POA: Insufficient documentation

## 2020-03-22 DIAGNOSIS — Y999 Unspecified external cause status: Secondary | ICD-10-CM | POA: Insufficient documentation

## 2020-03-22 DIAGNOSIS — R55 Syncope and collapse: Secondary | ICD-10-CM | POA: Diagnosis not present

## 2020-03-22 DIAGNOSIS — Z79899 Other long term (current) drug therapy: Secondary | ICD-10-CM | POA: Diagnosis not present

## 2020-03-22 DIAGNOSIS — W19XXXA Unspecified fall, initial encounter: Secondary | ICD-10-CM | POA: Insufficient documentation

## 2020-03-22 DIAGNOSIS — Y939 Activity, unspecified: Secondary | ICD-10-CM | POA: Diagnosis not present

## 2020-03-22 DIAGNOSIS — S0990XA Unspecified injury of head, initial encounter: Secondary | ICD-10-CM | POA: Diagnosis present

## 2020-03-22 DIAGNOSIS — Y929 Unspecified place or not applicable: Secondary | ICD-10-CM | POA: Insufficient documentation

## 2020-03-22 MED ORDER — AMOXICILLIN-POT CLAVULANATE 875-125 MG PO TABS: 1.0000 | ORAL_TABLET | Freq: Two times a day (BID) | ORAL | 0 refills | Status: AC

## 2020-03-22 NOTE — ED Triage Notes (Signed)
Pt reports 2 days she passed out fell forward and hit her face on the wood floor. Pt reports afterwards area was swollen and painful. Pt reports today she went and got something out of the car and as she was walking back in she sneezed and as soon as she did her eye blew up shot and it felt like the snot went into her eyelid. Pt not able to open left eye.

## 2020-03-22 NOTE — ED Provider Notes (Signed)
[facility] Emergency Department Provider Note   ____________________________________________    I have reviewed the triage vital signs and the nursing notes.   HISTORY  Chief Complaint Loss of Consciousness, Fall, and Facial Swelling     HPI Andrea [name] is a 39 y.o. female reports that 3 days ago she fell after syncopal episode (patient reports that she has had syncopal episodes for years, has had significant work-ups, unclear cause).  Patient fell forward onto her face and injured her lower forehead and had pain along the medial aspect of her nose.  Today she blew her nose and instantly developed swelling in her left eyelid.  No bleeding.  Complains of mild left-sided headache.  Past Medical History:  Diagnosis Date  . Abnormal finding on MRI of brain 01/02/2017  . Anxiety and depression    see progress notes -sw  . Common migraine with intractable migraine 01/02/2017  . Polycystic ovarian disease   . Psoriasis   . Rosacea   . Seizure [facility])    as child from trauma; no meds and no further seizures    Patient Active Problem List   Diagnosis Date Noted  . Morbid obesity (HCC) 04/06/2019  . Cough 03/26/2019  . Acute non-recurrent pansinusitis 03/10/2019  . Severe episode of recurrent major depressive disorder, without psychotic features (HCC) 03/10/2019  . Generalized joint pain 03/10/2019  . Primary insomnia 03/10/2019  . Screening for malignant neoplasm of cervix 10/25/2018  . Knee pain 10/23/2018  . Triceps tendinitis 10/23/2018  . Fracture of proximal phalanx of thumb 10/23/2018  . Other constipation 08/11/2018  . Family hx of colon cancer 08/11/2018  . Episode of moderate major depression (HCC) 07/20/2018  . Fatigue 07/20/2018  . Fecal impaction (HCC)   . Obstipation 06/17/2018  . Acute retention of urine 06/17/2018  . Constipation   . Colitis 06/16/2018  . Anxiety and depression 06/16/2018  . Hypokalemia 06/16/2018  .  Leukocytosis 06/16/2018  . Acute upper respiratory infection 05/20/2018  . Urinary tract infection with hematuria 05/20/2018  . Dysuria 05/20/2018  . Vaginal candidiasis 05/20/2018  . Bilateral lower extremity edema 05/20/2018  . Other symptoms and signs involving the nervous system 05/20/2018  . Generalized anxiety disorder 05/20/2018  . Encounter for general adult medical examination with abnormal findings 05/20/2018  . Family history of breast cancer in mother 05/20/2018  . Osteoarthritis of knee 02/28/2017  . Common migraine with intractable migraine 01/02/2017  . White matter 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 01/02/2017  . Postoperative wound cellulitis 05/08/2016  . S/P cesarean section 04/24/2016  . Status post primary low transverse cesarean section 04/24/2016  . Oligohydramnios 04/19/2016  . Supervision of pregnancy with history of pre-term labor in first trimester 10/31/2015    Past Surgical History:  Procedure Laterality Date  . CESAREAN SECTION N/A 04/24/2016   Procedure: CESAREAN SECTION;  Surgeon: Brock Bad, MD;  Location: [facility] BIRTHING SUITES;  Service: Obstetrics;  Laterality: N/A;  . CHOLECYSTECTOMY    . TONSILLECTOMY    . TONSILLECTOMY AND ADENOIDECTOMY      Prior to Admission medications   Medication Sig Start Date End Date Taking? Authorizing Provider  albuterol (VENTOLIN HFA) 108 (90 Base) MCG/ACT inhaler Inhale 2 puffs into the lungs every 6 (six) hours as needed for wheezing or shortness of breath. 03/26/19   Carlean Jews, NP  amoxicillin-clavulanate (AUGMENTIN) 875-125 MG tablet Take 1 tablet by mouth 2 (two) times daily for 7 days. 03/22/20 03/29/20  Jream Broyles,  Molly Maduro, MD  aspirin-acetaminophen-caffeine [facility] MIGRAINE) [phone] MG tablet Take 2 tablets by mouth every 6 (six) hours as needed for headache or migraine.    [provider]  busPIRone (BUSPAR) 30 MG tablet Take 1 tablet (30 mg total) by mouth in the morning and at bedtime. 03/14/20    Johnna Acosta, NP  cyclobenzaprine (FLEXERIL) 10 MG tablet Take 1 tablet (10 mg total) by mouth every 8 (eight) hours as needed for muscle spasms. 03/14/20   Johnna Acosta, NP  DULoxetine (CYMBALTA) 60 MG capsule Take 1 capsule (60 mg total) by mouth 2 (two) times daily. 03/14/20   [place]ar[place], Coralee North, NP  Erenumab-aooe (AIMOVIG) 70 MG/ML SOAJ Inject 70 mg into the skin every 30 (thirty) days. Once a month 03/14/20   Johnna Acosta, NP  fluconazole (DIFLUCAN) 150 MG tablet Take 1 tablet (150 mg total) by mouth every three (3) days as needed. 03/14/20   Johnna Acosta, NP  gabapentin (NEURONTIN) 600 MG tablet Take 1 tablet (600 mg total) by mouth 3 (three) times daily. 03/14/20   Johnna Acosta, NP  hydrOXYzine (VISTARIL) 50 MG capsule Take 1 capsule (50 mg total) by mouth 2 (two) times daily as needed. 03/14/20   Johnna Acosta, NP  meloxicam (MOBIC) 15 MG tablet Take 1 tablet (15 mg total) by mouth daily. 03/14/20   Johnna Acosta, NP  PARoxetine (PAXIL) 40 MG tablet Take 1 tablet (40 mg total) by mouth at bedtime. 03/14/20   Johnna Acosta, NP  rizatriptan (MAXALT-MLT) 10 MG disintegrating tablet Take 1 tablet (10 mg total) by mouth as needed for migraine. May repeat in 2 hours if needed 03/14/20   Johnna Acosta, NP  traZODone (DESYREL) 100 MG tablet Take 1-2 tablets (100-200 mg total) by mouth at bedtime. 1-2 tab at bedtime 03/14/20   Johnna Acosta, NP     Allergies Codeine, Hydrocodone, Metronidazole, and Morphine and related  Family History  Problem Relation Age of Onset  . Breast cancer Mother   . Bone cancer Mother   . Migraines Mother   . Colon cancer Father   . Prostate cancer Father   . Brain cancer Maternal Grandmother   . Migraines Maternal Grandmother   . Breast cancer Maternal Grandmother   . Migraines Sister     Social History Social History   Tobacco Use  . Smoking status: Current Some Day Smoker    Packs/day: 0.50    Years: 6.00    Pack years: 3.00     Types: Cigarettes  . Smokeless tobacco: Never Used  Substance Use Topics  . Alcohol use: No  . Drug use: No    Review of Systems  Constitutional: No fever/chills Eyes: As above ENT: As above Cardiovascular: Denies chest pain. Respiratory: Denies shortness of breath. Gastrointestinal: No abdominal pain.  No nausea, no vomiting.   Genitourinary: Negative for dysuria. Musculoskeletal: Negative for back pain. Skin: Negative for rash. Neurological: No neuro deficits   ____________________________________________   PHYSICAL EXAM:  VITAL SIGNS: ED Triage Vitals  Enc Vitals Group     BP 03/22/20 1427 (!) 147/83     Pulse Rate 03/22/20 1427 87     Resp 03/22/20 1427 18     Temp 03/22/20 1427 98.4 F (36.9 C)     Temp src --      SpO2 03/22/20 1427 98 %     Weight 03/22/20 1428 93.4 kg (206 lb)     Height 03/22/20  1428 1.702 m (5\' 7" )     Head Circumference --      Peak Flow --      Pain Score 03/22/20 1438 9     Pain Loc --      Pain Edu? --      Excl. in [place]? --     Constitutional: Alert and oriented. Eyes: Left orbit swollen shut, crepitus noted on left eyelid.  Able to retract the lid and determine normal movements of the eye.  No hyphema. Head: Mild swelling along the lower forehead Nose: No epistaxis Mouth/Throat: Mucous membranes are moist.   Neck:  Painless ROM Cardiovascular: Normal rate, regular rhythm. Good peripheral circulation. Respiratory: Normal respiratory effort.  No retractions.  Gastrointestinal: Soft and nontender. No distention.    Musculoskeletal: No lower extremity tenderness nor edema.  Warm and well perfused Neurologic:  Normal speech and language. No gross focal neurologic deficits are appreciated.  Skin:  Skin is warm, dry and intact. No rash noted. Psychiatric: Mood and affect are normal. Speech and behavior are normal.  ____________________________________________   LABS (all labs ordered are listed, but only abnormal results are  displayed)  Labs Reviewed - No data to display ____________________________________________  EKG  None ____________________________________________  RADIOLOGY  CT max face demonstrates moderate left periorbital soft tissue emphysema with moderate to large amount of left orbital emphysema, acute fractures of the medial wall of the left orbit and posterior floor of the left orbit, no displacement ____________________________________________   PROCEDURES  Procedure(s) performed: No  Procedures   Critical Care performed: No ____________________________________________   INITIAL IMPRESSION / ASSESSMENT AND PLAN / ED COURSE  Pertinent labs & imaging results that were available during my care of the patient were reviewed by me and considered in my medical decision making (see chart for details).  Patient presents with swelling to the left orbit as noted above.  Given rapidity of swelling likely related to air being displaced into the eyelid/orbit, this is confirmed on CT max face.  Discussed with Dr. Edison Pace of ophthalmology who will see the patient in 2 days in his office, recommends Augmentin    ____________________________________________   FINAL CLINICAL IMPRESSION(S) / ED DIAGNOSES  Final diagnoses:  Orbital fracture, closed, initial encounter [facility])        Note:  This document was prepared using Dragon voice recognition software and may include unintentional dictation errors.   Lavonia Drafts, MD 03/22/20 1810

## 2020-03-28 ENCOUNTER — Encounter: Payer: Self-pay | Admitting: Neurology

## 2020-04-02 DIAGNOSIS — R531 Weakness: Secondary | ICD-10-CM | POA: Insufficient documentation

## 2020-04-02 DIAGNOSIS — G950 Syringomyelia and syringobulbia: Secondary | ICD-10-CM | POA: Insufficient documentation

## 2020-04-07 ENCOUNTER — Telehealth: Payer: Self-pay

## 2020-04-07 NOTE — Telephone Encounter (Signed)
Called lmom informing patient of appointment on 04/11/2020. klh 

## 2020-04-10 ENCOUNTER — Telehealth: Payer: Self-pay

## 2020-04-10 LAB — POCT URINE PREGNANCY: Preg Test, Ur: NEGATIVE

## 2020-04-10 NOTE — Progress Notes (Signed)
Patient has appointment with you 04/11/2020

## 2020-04-10 NOTE — Telephone Encounter (Signed)
Confirmed appointment on 04/11/2020 and screened for covid. klh °

## 2020-04-11 ENCOUNTER — Other Ambulatory Visit: Payer: Self-pay

## 2020-04-11 ENCOUNTER — Encounter: Payer: Self-pay | Admitting: Adult Health

## 2020-04-11 ENCOUNTER — Ambulatory Visit: Payer: Medicaid Other | Admitting: Adult Health

## 2020-04-11 VITALS — BP 120/78 | HR 82 | Temp 97.6°F | Resp 16 | Ht 67.0 in | Wt 219.4 lb

## 2020-04-11 DIAGNOSIS — F321 Major depressive disorder, single episode, moderate: Secondary | ICD-10-CM | POA: Diagnosis not present

## 2020-04-11 DIAGNOSIS — G43019 Migraine without aura, intractable, without status migrainosus: Secondary | ICD-10-CM | POA: Diagnosis not present

## 2020-04-11 DIAGNOSIS — F411 Generalized anxiety disorder: Secondary | ICD-10-CM | POA: Diagnosis not present

## 2020-04-11 DIAGNOSIS — R9082 White matter disease, unspecified: Secondary | ICD-10-CM

## 2020-04-11 DIAGNOSIS — F17200 Nicotine dependence, unspecified, uncomplicated: Secondary | ICD-10-CM

## 2020-04-11 DIAGNOSIS — B373 Candidiasis of vulva and vagina: Secondary | ICD-10-CM

## 2020-04-11 DIAGNOSIS — M170 Bilateral primary osteoarthritis of knee: Secondary | ICD-10-CM

## 2020-04-11 DIAGNOSIS — B3731 Acute candidiasis of vulva and vagina: Secondary | ICD-10-CM

## 2020-04-11 MED ORDER — FLUCONAZOLE 150 MG PO TABS: 150.0000 mg | ORAL_TABLET | ORAL | 0 refills | Status: DC | PRN

## 2020-04-11 NOTE — Progress Notes (Signed)
[facility] [address] [place], [place] [address]  Internal MEDICINE  Office Visit Note  Patient Name: Andrea [name]  [id number]  [id number]  Date of Service: 04/11/2020  Chief Complaint  Patient presents with  . Follow-up  . Depression    HPI Pt is here for follow up on anxiety, depression and migraines. She reports since our last visit she had a fall that resulted in an orbital fracture on 03/19/20.  She did not seek treatment until 03/22/20 because she sneezed and caused her right eye to swell. She was placed on Augmentin in the ER and sent home with referral to [facility] doctor.  She is doing well and the swelling has resolved. She is due to follow up with  eye center next week.  Also she has  Referral to see neurology soon, for her syncopal episodes. She has a history of white matter disease which she following up with neurology for. Her depression and anxiety have been at baseline, she feels like she is managing well at this time.     Current Medication: Outpatient Encounter Medications as of 04/11/2020  Medication Sig  . albuterol (VENTOLIN HFA) 108 (90 Base) MCG/ACT inhaler Inhale 2 puffs into the lungs every 6 (six) hours as needed for wheezing or shortness of breath.  Marland Kitchen aspirin-acetaminophen-caffeine (EXCEDRIN MIGRAINE) 250-250-65 MG tablet Take 2 tablets by mouth every 6 (six) hours as needed for headache or migraine.  . busPIRone (BUSPAR) 30 MG tablet Take 1 tablet (30 mg total) by mouth in the morning and at bedtime.  . cyclobenzaprine (FLEXERIL) 10 MG tablet Take 1 tablet (10 mg total) by mouth every 8 (eight) hours as needed for muscle spasms.  . DULoxetine (CYMBALTA) 60 MG capsule Take 1 capsule (60 mg total) by mouth 2 (two) times daily.  Dorise Hiss (AIMOVIG) 70 MG/ML SOAJ Inject 70 mg into the skin every 30 (thirty) days. Once a month  . fluconazole (DIFLUCAN) 150 MG tablet Take 1 tablet (150 mg total) by mouth every three (3) days as needed.  .  gabapentin (NEURONTIN) 600 MG tablet Take 1 tablet (600 mg total) by mouth 3 (three) times daily.  . hydrOXYzine (VISTARIL) 50 MG capsule Take 1 capsule (50 mg total) by mouth 2 (two) times daily as needed.  . meloxicam (MOBIC) 15 MG tablet Take 1 tablet (15 mg total) by mouth daily.  Marland Kitchen PARoxetine (PAXIL) 40 MG tablet Take 1 tablet (40 mg total) by mouth at bedtime.  . rizatriptan (MAXALT-MLT) 10 MG disintegrating tablet Take 1 tablet (10 mg total) by mouth as needed for migraine. May repeat in 2 hours if needed  . traZODone (DESYREL) 100 MG tablet Take 1-2 tablets (100-200 mg total) by mouth at bedtime. 1-2 tab at bedtime  . [DISCONTINUED] fluconazole (DIFLUCAN) 150 MG tablet Take 1 tablet (150 mg total) by mouth every three (3) days as needed.   No facility-administered encounter medications on file as of 04/11/2020.    Surgical History: Past Surgical History:  Procedure Laterality Date  . CESAREAN SECTION N/A 04/24/2016   Procedure: CESAREAN SECTION;  Surgeon: Brock Bad, MD;  Location: [facility] BIRTHING SUITES;  Service: Obstetrics;  Laterality: N/A;  . CHOLECYSTECTOMY    . TONSILLECTOMY    . TONSILLECTOMY AND ADENOIDECTOMY      Medical History: Past Medical History:  Diagnosis Date  . Abnormal finding on MRI of brain 01/02/2017  . Anxiety and depression    see progress notes -sw  . Common migraine with  intractable migraine 01/02/2017  . Polycystic ovarian disease   . Psoriasis   . Rosacea   . Seizure (HCC)    as child from trauma; no meds and no further seizures    Family History: Family History  Problem Relation Age of Onset  . Breast cancer Mother   . Bone cancer Mother   . Migraines Mother   . Colon cancer Father   . Prostate cancer Father   . Brain cancer Maternal Grandmother   . Migraines Maternal Grandmother   . Breast cancer Maternal Grandmother   . Migraines Sister     Social History   Socioeconomic History  . Marital status: Divorced    Spouse name: Not on  file  . Number of children: 2  . Years of education: GED  . Highest education level: Not on file  Occupational History  . Occupation: N/A  Tobacco Use  . Smoking status: Current Some Day Smoker    Packs/day: 0.50    Years: 6.00    Pack years: 3.00    Types: Cigarettes  . Smokeless tobacco: Never Used  Substance and Sexual Activity  . Alcohol use: No  . Drug use: No  . Sexual activity: Yes    Birth control/protection: None  Other Topics Concern  . Not on file  Social History Narrative   Lives at home w/ her children   Right-handed   Caffeine: 1 cup coffee per day   Social Determinants of Health   Financial Resource Strain:   . Difficulty of Paying Living Expenses:   Food Insecurity:   . Worried About Programme researcher, broadcasting/film/video in the Last Year:   . Barista in the Last Year:   Transportation Needs:   . Freight forwarder (Medical):   Marland Kitchen Lack of Transportation (Non-Medical):   Physical Activity:   . Days of Exercise per Week:   . Minutes of Exercise per Session:   Stress:   . Feeling of Stress :   Social Connections:   . Frequency of Communication with Friends and Family:   . Frequency of Social Gatherings with Friends and Family:   . Attends Religious Services:   . Active Member of Clubs or Organizations:   . Attends Banker Meetings:   Marland Kitchen Marital Status:   Intimate Partner Violence:   . Fear of Current or Ex-Partner:   . Emotionally Abused:   Marland Kitchen Physically Abused:   . Sexually Abused:       Review of Systems  Constitutional: Negative for chills, fatigue and unexpected weight change.  HENT: Negative for congestion, rhinorrhea, sneezing and sore throat.   Eyes: Negative for photophobia, pain and redness.  Respiratory: Negative for cough, chest tightness and shortness of breath.   Cardiovascular: Negative for chest pain and palpitations.  Gastrointestinal: Negative for abdominal pain, constipation, diarrhea, nausea and vomiting.  Endocrine:  Negative.   Genitourinary: Negative for dysuria and frequency.  Musculoskeletal: Negative for arthralgias, back pain, joint swelling and neck pain.  Skin: Negative for rash.  Allergic/Immunologic: Negative.   Neurological: Negative for tremors and numbness.  Hematological: Negative for adenopathy. Does not bruise/bleed easily.  Psychiatric/Behavioral: Negative for behavioral problems and sleep disturbance. The patient is not nervous/anxious.     Vital Signs: BP 120/78   Pulse 82   Temp 97.6 F (36.4 C)   Resp 16   Ht 5\' 7"  (1.702 m)   Wt 219 lb 6.4 oz (99.5 kg)   SpO2 97%  BMI 34.36 kg/m    Physical Exam Vitals and nursing note reviewed.  Constitutional:      General: She is not in acute distress.    Appearance: She is well-developed. She is not diaphoretic.  HENT:     Head: Normocephalic and atraumatic.     Mouth/Throat:     Pharynx: No oropharyngeal exudate.  Eyes:     Pupils: Pupils are equal, round, and reactive to light.  Neck:     Thyroid: No thyromegaly.     Vascular: No JVD.     Trachea: No tracheal deviation.  Cardiovascular:     Rate and Rhythm: Normal rate and regular rhythm.     Heart sounds: Normal heart sounds. No murmur. No friction rub. No gallop.   Pulmonary:     Effort: Pulmonary effort is normal. No respiratory distress.     Breath sounds: Normal breath sounds. No wheezing or rales.  Chest:     Chest wall: No tenderness.  Abdominal:     Palpations: Abdomen is soft.     Tenderness: There is no abdominal tenderness. There is no guarding.  Musculoskeletal:        General: Normal range of motion.     Cervical back: Normal range of motion and neck supple.  Lymphadenopathy:     Cervical: No cervical adenopathy.  Skin:    General: Skin is warm and dry.  Neurological:     Mental Status: She is alert and oriented to person, place, and time.     Cranial Nerves: No cranial nerve deficit.  Psychiatric:        Behavior: Behavior normal.         Thought Content: Thought content normal.        Judgment: Judgment normal.    Assessment/Plan: 1. Common migraine with intractable migraine Samples of Nurtec given, continue medications as discussed.   2. Episode of moderate major depression ([place]) She is currently doing well.  NAD noted at this time.   3. Vaginal candida Refilled diflucan, as patient just completed antibiotic course.  - fluconazole (DIFLUCAN) 150 MG tablet; Take 1 tablet (150 mg total) by mouth every three (3) days as needed.  Dispense: 3 tablet; Refill: 0  4. Generalized anxiety disorder Stable, at this time.  Continue at present management.   5. White matter 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 Continue to follow up with Neurology as scheduled.   6. Primary osteoarthritis of both knees See [facility] to reestablish care for knee injections.  - Ambulatory referral to Orthopedic Surgery  7. Nicotine dependence with current use Smoking cessation counseling: 1. Pt acknowledges the risks of long term smoking, she will try to quite smoking. 2. Options for different medications including nicotine products, chewing gum, patch etc, Wellbutrin and Chantix is discussed 3. Goal and date of compete cessation is discussed 4. Total time spent in smoking cessation is 15 min. - DG Chest 2 View; Future  General Counseling: Andrea [name] verbalizes understanding of the findings of todays visit and agrees with plan of treatment. I have discussed any further diagnostic evaluation that may be needed or ordered today. We also reviewed her medications today. she has been encouraged to call the office with any questions or concerns that should arise related to todays visit.    Orders Placed This Encounter  Procedures  . DG Chest 2 View  . Ambulatory referral to Orthopedic Surgery    Meds ordered this encounter  Medications  . fluconazole (DIFLUCAN) 150 MG tablet  Sig: Take 1 tablet (150 mg total) by mouth every three (3) days as needed.     Dispense:  3 tablet    Refill:  0    Time spent: 30 Minutes   This patient was seen by Blima Ledger AGNP-C in Collaboration with Dr Lyndon Code as a part of collaborative care agreement     Johnna Acosta AGNP-C Internal medicine

## 2020-04-14 ENCOUNTER — Ambulatory Visit
Admission: RE | Admit: 2020-04-14 | Discharge: 2020-04-14 | Disposition: A | Discharge status: Home / Self Care | Payer: Medicaid Other | Source: Ambulatory Visit | Attending: Adult Health | Admitting: Adult Health

## 2020-04-14 ENCOUNTER — Other Ambulatory Visit: Payer: Self-pay

## 2020-04-14 DIAGNOSIS — F17200 Nicotine dependence, unspecified, uncomplicated: Secondary | ICD-10-CM | POA: Diagnosis present

## 2020-05-22 ENCOUNTER — Other Ambulatory Visit: Payer: Self-pay | Admitting: Adult Health

## 2020-05-22 DIAGNOSIS — G8929 Other chronic pain: Secondary | ICD-10-CM

## 2020-05-22 DIAGNOSIS — M5442 Lumbago with sciatica, left side: Secondary | ICD-10-CM

## 2020-06-01 ENCOUNTER — Telehealth: Payer: Self-pay

## 2020-06-01 NOTE — Telephone Encounter (Signed)
Called lmom informing patient of appointment on 0706/2021. klh 

## 2020-06-06 ENCOUNTER — Other Ambulatory Visit: Payer: Self-pay

## 2020-06-06 ENCOUNTER — Ambulatory Visit: Payer: Medicaid Other | Admitting: Adult Health

## 2020-06-06 ENCOUNTER — Encounter: Payer: Self-pay | Admitting: Adult Health

## 2020-06-06 VITALS — BP 129/86 | HR 92 | Temp 97.8°F | Resp 16 | Ht 67.0 in | Wt 206.0 lb

## 2020-06-06 DIAGNOSIS — G43009 Migraine without aura, not intractable, without status migrainosus: Secondary | ICD-10-CM

## 2020-06-06 DIAGNOSIS — R9082 White matter disease, unspecified: Secondary | ICD-10-CM

## 2020-06-06 DIAGNOSIS — B373 Candidiasis of vulva and vagina: Secondary | ICD-10-CM | POA: Diagnosis not present

## 2020-06-06 DIAGNOSIS — R059 Cough, unspecified: Secondary | ICD-10-CM

## 2020-06-06 DIAGNOSIS — F321 Major depressive disorder, single episode, moderate: Secondary | ICD-10-CM

## 2020-06-06 DIAGNOSIS — F411 Generalized anxiety disorder: Secondary | ICD-10-CM

## 2020-06-06 DIAGNOSIS — R05 Cough: Secondary | ICD-10-CM

## 2020-06-06 DIAGNOSIS — F17200 Nicotine dependence, unspecified, uncomplicated: Secondary | ICD-10-CM

## 2020-06-06 DIAGNOSIS — B3731 Acute candidiasis of vulva and vagina: Secondary | ICD-10-CM

## 2020-06-06 MED ORDER — FLUCONAZOLE 150 MG PO TABS: 150.0000 mg | ORAL_TABLET | ORAL | 0 refills | Status: DC | PRN

## 2020-06-06 MED ORDER — ALBUTEROL SULFATE HFA 108 (90 BASE) MCG/ACT IN AERS: 2.0000 | INHALATION_SPRAY | Freq: Four times a day (QID) | RESPIRATORY_TRACT | 3 refills | Status: DC | PRN

## 2020-06-06 MED ORDER — AIMOVIG 70 MG/ML ~~LOC~~ SOAJ: 70.0000 mg | SUBCUTANEOUS | 5 refills | Status: DC

## 2020-06-06 NOTE — Progress Notes (Signed)
[facility] [address] [place], [place] [address]  Internal MEDICINE  Office Visit Note  Patient Name: Andrea [name]  [id number]  [id number]  Date of Service: 06/06/2020  Chief Complaint  Patient presents with  . Follow-up  . Depression    HPI  Pt is here for follow up on depression and anxiety.  Overall she is doing fair.  She has a lot of stress surrounding her situation and her anxiety at this time.  She was recently homeless and living in her car.  She now has somewhere to live, and is doing well.  She thinks her inability to bath for a few days has caused her to have a yeast infection.    Her migraines have been controlled since starting amovig.    Current Medication: Outpatient Encounter Medications as of 06/06/2020  Medication Sig  . albuterol (VENTOLIN HFA) 108 (90 Base) MCG/ACT inhaler Inhale 2 puffs into the lungs every 6 (six) hours as needed for wheezing or shortness of breath.  Marland Kitchen aspirin-acetaminophen-caffeine (EXCEDRIN MIGRAINE) 250-250-65 MG tablet Take 2 tablets by mouth every 6 (six) hours as needed for headache or migraine.  . busPIRone (BUSPAR) 30 MG tablet Take 1 tablet (30 mg total) by mouth in the morning and at bedtime.  . cyclobenzaprine (FLEXERIL) 10 MG tablet Take 1 tablet (10 mg total) by mouth every 8 (eight) hours as needed for muscle spasms.  . DULoxetine (CYMBALTA) 60 MG capsule Take 1 capsule (60 mg total) by mouth 2 (two) times daily.  Dorise Hiss (AIMOVIG) 70 MG/ML SOAJ Inject 70 mg into the skin every 30 (thirty) days. Once a month  . fluconazole (DIFLUCAN) 150 MG tablet Take 1 tablet (150 mg total) by mouth every three (3) days as needed.  . gabapentin (NEURONTIN) 600 MG tablet Take 1 tablet (600 mg total) by mouth 3 (three) times daily.  . hydrOXYzine (VISTARIL) 50 MG capsule Take 1 capsule (50 mg total) by mouth 2 (two) times daily as needed.  . meloxicam (MOBIC) 15 MG tablet TAKE 1 TABLET(15 MG) BY MOUTH DAILY  . PARoxetine  (PAXIL) 40 MG tablet Take 1 tablet (40 mg total) by mouth at bedtime.  . rizatriptan (MAXALT-MLT) 10 MG disintegrating tablet Take 1 tablet (10 mg total) by mouth as needed for migraine. May repeat in 2 hours if needed  . traZODone (DESYREL) 100 MG tablet Take 1-2 tablets (100-200 mg total) by mouth at bedtime. 1-2 tab at bedtime  . [DISCONTINUED] albuterol (VENTOLIN HFA) 108 (90 Base) MCG/ACT inhaler Inhale 2 puffs into the lungs every 6 (six) hours as needed for wheezing or shortness of breath.   No facility-administered encounter medications on file as of 06/06/2020.    Surgical History: Past Surgical History:  Procedure Laterality Date  . CESAREAN SECTION N/A 04/24/2016   Procedure: CESAREAN SECTION;  Surgeon: Brock Bad, MD;  Location: [facility] BIRTHING SUITES;  Service: Obstetrics;  Laterality: N/A;  . CHOLECYSTECTOMY    . TONSILLECTOMY    . TONSILLECTOMY AND ADENOIDECTOMY      Medical History: Past Medical History:  Diagnosis Date  . Abnormal finding on MRI of brain 01/02/2017  . Anxiety and depression    see progress notes -sw  . Common migraine with intractable migraine 01/02/2017  . Polycystic ovarian disease   . Psoriasis   . Rosacea   . Seizure [facility])    as child from trauma; no meds and no further seizures    Family History: Family History  Problem Relation  Age of Onset  . Breast cancer Mother   . Bone cancer Mother   . Migraines Mother   . Colon cancer Father   . Prostate cancer Father   . Brain cancer Maternal Grandmother   . Migraines Maternal Grandmother   . Breast cancer Maternal Grandmother   . Migraines Sister     Social History   Socioeconomic History  . Marital status: Divorced    Spouse name: Not on file  . Number of children: 2  . Years of education: GED  . Highest education level: Not on file  Occupational History  . Occupation: N/A  Tobacco Use  . Smoking status: Current Some Day Smoker    Packs/day: 0.50    Years: 6.00    Pack years: 3.00     Types: Cigarettes  . Smokeless tobacco: Never Used  Vaping Use  . Vaping Use: Never used  Substance and Sexual Activity  . Alcohol use: No  . Drug use: No  . Sexual activity: Yes    Birth control/protection: None  Other Topics Concern  . Not on file  Social History Narrative   Lives at home w/ her children   Right-handed   Caffeine: 1 cup coffee per day   Social Determinants of Health   Financial Resource Strain:   . Difficulty of Paying Living Expenses:   Food Insecurity:   . Worried About Programme researcher, broadcasting/film/video in the Last Year:   . Barista in the Last Year:   Transportation Needs:   . Freight forwarder (Medical):   Marland Kitchen Lack of Transportation (Non-Medical):   Physical Activity:   . Days of Exercise per Week:   . Minutes of Exercise per Session:   Stress:   . Feeling of Stress :   Social Connections:   . Frequency of Communication with Friends and Family:   . Frequency of Social Gatherings with Friends and Family:   . Attends Religious Services:   . Active Member of Clubs or Organizations:   . Attends Banker Meetings:   Marland Kitchen Marital Status:   Intimate Partner Violence:   . Fear of Current or Ex-Partner:   . Emotionally Abused:   Marland Kitchen Physically Abused:   . Sexually Abused:       Review of Systems  Constitutional: Negative for chills, fatigue and unexpected weight change.  HENT: Negative for congestion, rhinorrhea, sneezing and sore throat.   Eyes: Negative for photophobia, pain and redness.  Respiratory: Negative for cough, chest tightness and shortness of breath.   Cardiovascular: Negative for chest pain and palpitations.  Gastrointestinal: Negative for abdominal pain, constipation, diarrhea, nausea and vomiting.  Endocrine: Negative.   Genitourinary: Negative for dysuria and frequency.  Musculoskeletal: Negative for arthralgias, back pain, joint swelling and neck pain.  Skin: Negative for rash.  Allergic/Immunologic: Negative.    Neurological: Negative for tremors and numbness.  Hematological: Negative for adenopathy. Does not bruise/bleed easily.  Psychiatric/Behavioral: Negative for behavioral problems and sleep disturbance. The patient is not nervous/anxious.     Vital Signs: BP 129/86   Pulse 92   Temp 97.8 F (36.6 C)   Resp 16   Ht 5\' 7"  (1.702 m)   Wt 206 lb (93.4 kg)   SpO2 100%   BMI 32.26 kg/m    Physical Exam Vitals and nursing note reviewed.  Constitutional:      General: She is not in acute distress.    Appearance: She is well-developed. She is not  diaphoretic.  HENT:     Head: Normocephalic and atraumatic.     Mouth/Throat:     Pharynx: No oropharyngeal exudate.  Eyes:     Pupils: Pupils are equal, round, and reactive to light.  Neck:     Thyroid: No thyromegaly.     Vascular: No JVD.     Trachea: No tracheal deviation.  Cardiovascular:     Rate and Rhythm: Normal rate and regular rhythm.     Heart sounds: Normal heart sounds. No murmur heard.  No friction rub. No gallop.   Pulmonary:     Effort: Pulmonary effort is normal. No respiratory distress.     Breath sounds: Normal breath sounds. No wheezing or rales.  Chest:     Chest wall: No tenderness.  Abdominal:     Palpations: Abdomen is soft.     Tenderness: There is no abdominal tenderness. There is no guarding.  Musculoskeletal:        General: Normal range of motion.     Cervical back: Normal range of motion and neck supple.  Lymphadenopathy:     Cervical: No cervical adenopathy.  Skin:    General: Skin is warm and dry.  Neurological:     Mental Status: She is alert and oriented to person, place, and time.     Cranial Nerves: No cranial nerve deficit.  Psychiatric:        Behavior: Behavior normal.        Thought Content: Thought content normal.        Judgment: Judgment normal.     Assessment/Plan: 1. Episode of moderate major depression (HCC) Continue current management.   2. Generalized anxiety  disorder Increased anxiety, needs to follow up with [facility], patient will call and set up appt.   3. White matter 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 Follow up with Neurology as discussed.   4. Cough Refilled albuterol inhaler for as needed.  - albuterol (VENTOLIN HFA) 108 (90 Base) MCG/ACT inhaler; Inhale 2 puffs into the lungs every 6 (six) hours as needed for wheezing or shortness of breath.  Dispense: 18 g; Refill: 3  5. Vaginal candida Use diflucan as directed.  - fluconazole (DIFLUCAN) 150 MG tablet; Take 1 tablet (150 mg total) by mouth every three (3) days as needed.  Dispense: 3 tablet; Refill: 0  6. Nicotine dependence with current use Smoking cessation counseling: 1. Pt acknowledges the risks of long term smoking, she will try to quite smoking. 2. Options for different medications including nicotine products, chewing gum, patch etc, Wellbutrin and Chantix is discussed 3. Goal and date of compete cessation is discussed 4. Total time spent in smoking cessation is 15 min.  7. Migraine without aura and without status migrainosus, not intractable Patient doing well with aimovig, samples given and RX sent.  - Erenumab-aooe (AIMOVIG) 70 MG/ML SOAJ; Inject 70 mg into the skin every 30 (thirty) days. Once a month  Dispense: 1 pen; Refill: 5  General Counseling: Emelyn verbalizes understanding of the findings of todays visit and agrees with plan of treatment. I have discussed any further diagnostic evaluation that may be needed or ordered today. We also reviewed her medications today. she has been encouraged to call the office with any questions or concerns that should arise related to todays visit.    No orders of the defined types were placed in this encounter.   Meds ordered this encounter  Medications  . albuterol (VENTOLIN HFA) 108 (90 Base) MCG/ACT inhaler  Sig: Inhale 2 puffs into the lungs every 6 (six) hours as needed for wheezing or shortness of breath.    Dispense:   18 g    Refill:  3    Time spent: 30 Minutes   This patient was seen by Blima Ledger AGNP-C in Collaboration with Dr Lyndon Code as a part of collaborative care agreement     Johnna Acosta AGNP-C Internal medicine

## 2020-06-15 NOTE — Progress Notes (Signed)
NEUROLOGY CONSULTATION NOTE  Andrea [name] MRN: [id number] DOB: 06-Jan-1981  Referring provider: Vincent Gros, NP Primary care provider: Vincent Gros, NP  Reason for consult:  Abnormal MRI  HISTORY OF PRESENT ILLNESS: Andrea [name] is a 39 year old right-handed female with with migraines, psoriasis and anxiety and depression who presents for abnormal MRI.  History supplemented by prior neurologist's and referring provider's notes.  She has persistent daily headache.  She has had migraines since age 57.  They are severe holocephalic pounding headache with nausea, vomiting, photophobia, phonophobia, osmophobia, blurred vision and extreme fatigue.  They have been lasting 2 to 3 days.  Nurtec sometimes knocks it down to 15 to 20 minutes.   She is extremely drained for the next day.  They occur 4 to 5 a month.    She reports intermittent dizziness since teenager.  If she bends down to pick something up, she gets dizzy.  She explains that she feels like she will pass out.  She has associated diaphoresis, palpitations and tunnel vision.  Sometimes she passes out due to it.  Looking up or reaching up for something may cause it.  She feels fatigue for the rest of that day. She has never had a cardiac workup.  When she was in her early 27s, she had a brain MRI to evaluate dizziness, which demonstrated nonspecific white matter lesions.  She did not follow up with neurology.  She had history of bilateral hand numbness which she had been told was carpal tunnel syndrome.  Around 2016-2017, she started having increased migraines.  She had a repeat MRI of the brain on 11/24/2016 which showed scattered nonspecific white matter lesions in the bilateral cerebral hemisphere and left cerebellum.  She saw neurology.  Visual evoked potentials were normal.  MRI of cervical spine with and without contrast on 03/17/2017 was unremarkable.  She subsequently had a repeat MRI of brain with and without contrast on 06/03/2018  which was stable compared to prior imaging in December 2017.   She subsequently moved to [place] in early 2020.  She established care with a neurologist there. 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  She was also diagnosed with fibromyalgia.  She was experiencing numbness and tingling and found to have B1 and B12 deficiency.  She started supplements but later stopped because she started eating better.  Labs from April showed B12 721, folate 10.4 and B1 265.5.  Thyroid panel was normal.   Takes Excedrin Migraine one or twice a week.   1 to 2 cups coffee daily.   Water 32 to 48 oz daily.  No longer skips meals.    Current NSAIDS:  Mobic Current analgesics:  Excedrin Migraine Current triptans:  Maxalt MLT 10mg  Current ergotamine:  none Current anti-emetic:  none Current muscle relaxants:  Cyclobenzaprine 10mg  Current anti-anxiolytic:  Hydroxyzine, buspirone Current sleep aide:  trazodone Current Antihypertensive medications:  none Current Antidepressant medications:  Cymbalta 60mg  BID Current Anticonvulsant medications:  Gabapentin 600mg  TID Current anti-CGRP:  Aimovig 70mg , Nurtec Current Vitamins/Herbal/Supplements:  none Current Antihistamines/Decongestants:  none Other therapy:  none   Past NSAIDS:  ibuprofen Past analgesics:  BC powder Past abortive triptans:  Sumatriptan tablet Past abortive ergotamine:  none Past muscle relaxants:  none Past anti-emetic:  Zofran (variable), promethazine, Compazine Past antihypertensive medications:  propranolol Past antidepressant medications:  Lexapro, Paxil, venlafaxine Past anticonvulsant medications:  Topiramate, Depakote Past anti-CGRP:  none  Imaging (personally reviewed): 11/23/2016  MRI BRAIN WO:  1. No acute intracranial abnormality or mass.  2. Scattered small white matter lesions in both cerebral hemispheres and left cerebellum. These are nonspecific, however demyelinating disease should  be considered. Other potential etiologies include prior infection/inflammation, vasculitis, chronic small vessel ischemia, and sequelae of migraines.  03/16/2017 MRI CERVICAL SPINE W WO:  This is a normal MRI of the cervical spine with and without contrast.   Incidental note is made of a mildly prominent central canal from C5-C7 that is unlikely to be clinically significant. 06/03/2018 MRI BRAIN W WO:  1. No acute intracranial abnormality. Stable MRI appearance of the brain since 2017.  2. Unchanged scattered (approximately 7) small nonspecific, nonenhancing T2/FLAIR hyperintense lesions in the cerebral white matter and also the left cerebellum. No new abnormality.  PAST MEDICAL HISTORY: Past Medical History:  Diagnosis Date  . Abnormal finding on MRI of brain 01/02/2017  . Anxiety and depression    see progress notes -sw  . Common migraine with intractable migraine 01/02/2017  . Polycystic ovarian disease   . Psoriasis   . Rosacea   . Seizure (HCC)    as child from trauma; no meds and no further seizures    PAST SURGICAL HISTORY: Past Surgical History:  Procedure Laterality Date  . CESAREAN SECTION N/A 04/24/2016   Procedure: CESAREAN SECTION;  Surgeon: Brock Bad, MD;  Location: [facility] BIRTHING SUITES;  Service: Obstetrics;  Laterality: N/A;  . CHOLECYSTECTOMY    . TONSILLECTOMY    . TONSILLECTOMY AND ADENOIDECTOMY      MEDICATIONS: Current Outpatient Medications on File Prior to Visit  Medication Sig Dispense Refill  . albuterol (VENTOLIN HFA) 108 (90 Base) MCG/ACT inhaler Inhale 2 puffs into the lungs every 6 (six) hours as needed for wheezing or shortness of breath. 18 g 3  . aspirin-acetaminophen-caffeine (EXCEDRIN MIGRAINE) 250-250-65 MG tablet Take 2 tablets by mouth every 6 (six) hours as needed for headache or migraine.    . busPIRone (BUSPAR) 30 MG tablet Take 1 tablet (30 mg total) by mouth in the morning and at bedtime. 60 tablet 3  . cyclobenzaprine (FLEXERIL) 10 MG  tablet Take 1 tablet (10 mg total) by mouth every 8 (eight) hours as needed for muscle spasms. 90 tablet 2  . DULoxetine (CYMBALTA) 60 MG capsule Take 1 capsule (60 mg total) by mouth 2 (two) times daily. 60 capsule 3  . Erenumab-aooe (AIMOVIG) 70 MG/ML SOAJ Inject 70 mg into the skin every 30 (thirty) days. Once a month 1 pen 5  . fluconazole (DIFLUCAN) 150 MG tablet Take 1 tablet (150 mg total) by mouth every three (3) days as needed. 3 tablet 0  . gabapentin (NEURONTIN) 600 MG tablet Take 1 tablet (600 mg total) by mouth 3 (three) times daily. 180 tablet 1  . hydrOXYzine (VISTARIL) 50 MG capsule Take 1 capsule (50 mg total) by mouth 2 (two) times daily as needed. 60 capsule 2  . meloxicam (MOBIC) 15 MG tablet TAKE 1 TABLET(15 MG) BY MOUTH DAILY 30 tablet 3  . PARoxetine (PAXIL) 40 MG tablet Take 1 tablet (40 mg total) by mouth at bedtime. 90 tablet 1  . rizatriptan (MAXALT-MLT) 10 MG disintegrating tablet Take 1 tablet (10 mg total) by mouth as needed for migraine. May repeat in 2 hours if needed 10 tablet 3  . traZODone (DESYREL) 100 MG tablet Take 1-2 tablets (100-200 mg total) by mouth at bedtime. 1-2 tab at bedtime 60 tablet 2   No current facility-administered  medications on file prior to visit.    ALLERGIES: Allergies  Allergen Reactions  . Codeine Nausea And Vomiting  . Hydrocodone Itching  . Metronidazole     Patient is not aware of this allergy  . Morphine And Related Nausea And Vomiting    FAMILY HISTORY: Family History  Problem Relation Age of Onset  . Breast cancer Mother   . Bone cancer Mother   . Migraines Mother   . Colon cancer Father   . Prostate cancer Father   . Brain cancer Maternal Grandmother   . Migraines Maternal Grandmother   . Breast cancer Maternal Grandmother   . Migraines Sister     SOCIAL HISTORY: Social History   Socioeconomic History  . Marital status: Divorced    Spouse name: Not on file  . Number of children: 2  . Years of education:  GED  . Highest education level: Not on file  Occupational History  . Occupation: N/A  Tobacco Use  . Smoking status: Current Some Day Smoker    Packs/day: 0.50    Years: 6.00    Pack years: 3.00    Types: Cigarettes  . Smokeless tobacco: Never Used  Vaping Use  . Vaping Use: Never used  Substance and Sexual Activity  . Alcohol use: No  . Drug use: No  . Sexual activity: Yes    Birth control/protection: None  Other Topics Concern  . Not on file  Social History Narrative   Lives at home w/ her children   Right-handed   Caffeine: 1 cup coffee per day   Social Determinants of Health   Financial Resource Strain:   . Difficulty of Paying Living Expenses:   Food Insecurity:   . Worried About Programme researcher, broadcasting/film/videounning Out of Food in the Last Year:   . Baristaan Out of Food in the Last Year:   Transportation Needs:   . Freight forwarderLack of Transportation (Medical):   Marland Kitchen. Lack of Transportation (Non-Medical):   Physical Activity:   . Days of Exercise per Week:   . Minutes of Exercise per Session:   Stress:   . Feeling of Stress :   Social Connections:   . Frequency of Communication with Friends and Family:   . Frequency of Social Gatherings with Friends and Family:   . Attends Religious Services:   . Active Member of Clubs or Organizations:   . Attends BankerClub or Organization Meetings:   Marland Kitchen. Marital Status:   Intimate Partner Violence:   . Fear of Current or Ex-Partner:   . Emotionally Abused:   Marland Kitchen. Physically Abused:   . Sexually Abused:     PHYSICAL EXAM: Blood pressure 101/71, pulse 85, resp. rate 20, height 5\' 4"  (1.626 m), weight 221 lb (100.2 kg), SpO2 97 %. General: No acute distress.  Patient appears well-groomed.  Head:  Normocephalic/atraumatic Eyes:  fundi examined but not visualized Neck: supple, no paraspinal tenderness, full range of motion Back: No paraspinal tenderness Heart: regular rate and rhythm Lungs: Clear to auscultation bilaterally. Vascular: No carotid bruits. Neurological  Exam: Mental status: alert and oriented to person, place, and time, recent and remote memory intact, fund of knowledge intact, attention and concentration intact, speech fluent and not dysarthric, language intact. Cranial nerves: CN I: not tested CN II: pupils equal, round and reactive to light, visual fields intact CN III, IV, VI:  full range of motion, no nystagmus, no ptosis CN V: facial sensation intact CN VII: upper and lower face symmetric CN VIII: hearing intact CN  IX, X: gag intact, uvula midline CN XI: sternocleidomastoid and trapezius muscles intact CN XII: tongue midline Bulk & Tone: normal, no fasciculations. Motor:  5/5 throughout  Sensation:  Pinprick and vibration sensation intact. Deep Tendon Reflexes:  2+ throughout, toes downgoing.  Finger to nose testing:  Without dysmetria.  Heel to shin:  Without dysmetria.  Gait:  Normal station and stride.  Romberg negative.  IMPRESSION: 1.  Migraine without aura, without status migrainosus, not intractable 2.  Chronic daily headache 3.  Abnormal brain MRI 4.  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  Not seen on previous MRIs performed here in [place].  May be from trauma during a fall when she passed out. 5.  Recurrent syncope.  Semiology seems consistent with syncope.  Will still evaluate with EEG.  Orthostatic vitals showed 12 point drop in systolic blood pressure from sitting to standing, not quite meeting criteria for orthostatic hypotension  PLAN: 1.  Increase Aimovig to 140mg  every 28 days 2.  Will prescribe Nurtec for rescue as it is effective.  Failed sumatriptan, rizatriptan and OTC analgesics. 3.  Zofran ODT 8mg  for nausea 4.  Repeat MRI of brain, cervical and thoracic spine with and without contrast. 5.  EEG 6.  Referral to cardiology for evaluation of recurrent syncope  7.  Obtain notes from prior neurologist in . 8.  Follow up in 6 months.  Thank you for allowing me to take part in the care of  this patient.  , DO  CC: . [place], NP

## 2020-06-16 ENCOUNTER — Ambulatory Visit (INDEPENDENT_AMBULATORY_CARE_PROVIDER_SITE_OTHER): Payer: Medicaid Other | Admitting: Neurology

## 2020-06-16 ENCOUNTER — Encounter: Payer: Self-pay | Admitting: Neurology

## 2020-06-16 ENCOUNTER — Other Ambulatory Visit: Payer: Self-pay

## 2020-06-16 VITALS — BP 101/71 | HR 85 | Resp 20 | Ht 64.0 in | Wt 221.0 lb

## 2020-06-16 DIAGNOSIS — R519 Headache, unspecified: Secondary | ICD-10-CM

## 2020-06-16 DIAGNOSIS — R9089 Other abnormal findings on diagnostic imaging of central nervous system: Secondary | ICD-10-CM | POA: Diagnosis not present

## 2020-06-16 DIAGNOSIS — R55 Syncope and collapse: Secondary | ICD-10-CM | POA: Diagnosis not present

## 2020-06-16 DIAGNOSIS — G43009 Migraine without aura, not intractable, without status migrainosus: Secondary | ICD-10-CM | POA: Diagnosis not present

## 2020-06-16 DIAGNOSIS — G950 Syringomyelia and syringobulbia: Secondary | ICD-10-CM

## 2020-06-16 MED ORDER — ONDANSETRON 8 MG PO TBDP: 8.0000 mg | ORAL_TABLET | Freq: Three times a day (TID) | ORAL | 5 refills | Status: DC | PRN

## 2020-06-16 MED ORDER — AIMOVIG 140 MG/ML ~~LOC~~ SOAJ: 140.0000 mg | SUBCUTANEOUS | 5 refills | Status: DC

## 2020-06-16 MED ORDER — NURTEC 75 MG PO TBDP: 1.0000 | ORAL_TABLET | Freq: Every day | ORAL | 5 refills | Status: DC | PRN

## 2020-06-16 NOTE — Patient Instructions (Addendum)
°  1. We will increase Aimovig to 140mg  every 28 days 2. Take Nurtec at earliest onset of headache.  Maximum 1 tablet in 24 hours.  Take with Zofran ODT 8mg  3. Limit use of pain relievers to no more than 2 days out of the week.  These medications include acetaminophen, NSAIDs (ibuprofen/Advil/Motrin, naproxen/Aleve, triptans (Imitrex/sumatriptan), Excedrin, and narcotics.  This will help reduce risk of rebound headaches. 4. Be aware of common food triggers:  - Caffeine:  coffee, black tea, cola, Mt. Dew  - Chocolate  - Dairy:  aged cheeses (brie, blue, cheddar, gouda, [place], provolone, [place], Swiss, etc), chocolate milk, buttermilk, sour cream, limit eggs and yogurt  - Nuts, peanut butter  - Alcohol  - Cereals/grains:  FRESH breads (fresh bagels, sourdough, doughnuts), yeast productions  - Processed/canned/aged/cured meats (pre-packaged deli meats, hotdogs)  - MSG/glutamate:  soy sauce, flavor enhancer, pickled/preserved/marinated foods  - Sweeteners:  aspartame (Equal, Nutrasweet).  Sugar and Splenda are okay  - Vegetables:  legumes (lima beans, lentils, snow peas, fava beans, pinto peans, peas, garbanzo beans), sauerkraut, onions, olives, pickles  - Fruit:  avocados, bananas, citrus fruit (orange, lemon, grapefruit), mango  - Other:  Frozen meals, macaroni and cheese 5. Routine exercise 6. Stay adequately hydrated (aim for 64 oz water daily) 7. Keep headache diary 8. Maintain proper stress management 9. Maintain proper sleep hygiene 10. Do not skip meals 11. Consider supplements:  magnesium citrate 400mg  daily, riboflavin 400mg  daily, coenzyme Q10 100mg  three times daily. 12. We will also check MRI of brain, cervical and thoracic spine with and without contrast 13. Will refer you to cardiology for fainting spells. 14. We will check routine EEG 15. Follow up in 6 months

## 2020-06-24 ENCOUNTER — Other Ambulatory Visit: Payer: Self-pay | Admitting: Adult Health

## 2020-06-24 DIAGNOSIS — F32A Depression, unspecified: Secondary | ICD-10-CM

## 2020-06-24 DIAGNOSIS — G8929 Other chronic pain: Secondary | ICD-10-CM

## 2020-06-24 DIAGNOSIS — M5442 Lumbago with sciatica, left side: Secondary | ICD-10-CM

## 2020-06-24 DIAGNOSIS — F329 Major depressive disorder, single episode, unspecified: Secondary | ICD-10-CM

## 2020-06-26 ENCOUNTER — Other Ambulatory Visit: Payer: Self-pay

## 2020-06-26 ENCOUNTER — Ambulatory Visit (INDEPENDENT_AMBULATORY_CARE_PROVIDER_SITE_OTHER): Payer: Medicaid Other | Admitting: Neurology

## 2020-06-26 DIAGNOSIS — R55 Syncope and collapse: Secondary | ICD-10-CM

## 2020-06-26 DIAGNOSIS — R9089 Other abnormal findings on diagnostic imaging of central nervous system: Secondary | ICD-10-CM

## 2020-06-26 DIAGNOSIS — G950 Syringomyelia and syringobulbia: Secondary | ICD-10-CM

## 2020-06-26 DIAGNOSIS — G43009 Migraine without aura, not intractable, without status migrainosus: Secondary | ICD-10-CM | POA: Diagnosis not present

## 2020-06-26 DIAGNOSIS — R519 Headache, unspecified: Secondary | ICD-10-CM

## 2020-06-26 NOTE — Procedures (Signed)
ELECTROENCEPHALOGRAM REPORT  Date of Study: 06/26/2020  Patient's Name: Andrea [name] MRN: [id number] Date of Birth: 08/23/1981  Clinical History: 39 year old female with recurrent syncope  Medications: VENTOLIN HFA 108 (90 Base) MCG/ACT inhaler EXCEDRIN MIGRAINE 250-250-65 MG  tablet BUSPAR 30 MG tablet FLEXERIL 10 MG tablet CYMBALTA 60 MG capsule AIMOVIG 70 MG/ML SOAJ DIFLUCAN 150 MG tablet NEURONTIN 600 MG tablet VISTARIL 50 MG capsule MOBIC 15 MG tablet PAXIL 40 MG tablet MAXALT-MLT 10 MG disintegrating tablet DESYREL 100 MG tablet  Technical Summary: A multichannel digital EEG recording measured by the international 10-20 system with electrodes applied with paste and impedances below 5000 ohms performed in our laboratory with EKG monitoring in an awake and drowsy patient.  Hyperventilation not performed as patient wearing mask due to COVID 19 pandemic.  Photic stimulation was performed.  The digital EEG was referentially recorded, reformatted, and digitally filtered in a variety of bipolar and referential montages for optimal display.    Description: The patient is awake and drowsy during the recording.  During maximal wakefulness, there is a symmetric, medium voltage 12 Hz posterior dominant rhythm that attenuates with eye opening.  The record is symmetric.  During drowsiness, there is an increase in theta slowing of the background.  Stage 2 sleep was not seen.  Photic stimulation did not elicit any abnormalities.  There were no epileptiform discharges or electrographic seizures seen.    EKG lead was unremarkable.  Impression: This awake and drowsy EEG is normal.    Clinical Correlation: A normal EEG does not exclude a clinical diagnosis of epilepsy.  If further clinical questions remain, prolonged EEG may be helpful.  Clinical correlation is advised.   Shon Millet, DO

## 2020-07-04 ENCOUNTER — Encounter: Payer: Self-pay | Admitting: Internal Medicine

## 2020-07-04 ENCOUNTER — Ambulatory Visit (INDEPENDENT_AMBULATORY_CARE_PROVIDER_SITE_OTHER): Payer: Medicaid Other | Admitting: Internal Medicine

## 2020-07-04 ENCOUNTER — Other Ambulatory Visit: Payer: Self-pay

## 2020-07-04 VITALS — BP 112/72 | HR 92 | Ht 67.0 in | Wt 221.6 lb

## 2020-07-04 DIAGNOSIS — R42 Dizziness and giddiness: Secondary | ICD-10-CM

## 2020-07-04 DIAGNOSIS — R55 Syncope and collapse: Secondary | ICD-10-CM

## 2020-07-04 DIAGNOSIS — R600 Localized edema: Secondary | ICD-10-CM | POA: Diagnosis not present

## 2020-07-04 NOTE — Patient Instructions (Addendum)
Medication Instructions:  No Changes *If you need a refill on your cardiac medications before your next appointment, please call your pharmacy*   Lab Work: None Ordered If you have labs (blood work) drawn today and your tests are completely normal, you will receive your results only by: Marland Kitchen MyChart Message (if you have MyChart) OR . A paper copy in the mail If you have any lab test that is abnormal or we need to change your treatment, we will call you to review the results.   Testing/Procedures: This will be scheduled at [address].  Your physician has requested that you have an echocardiogram. Echocardiography is a painless test that uses sound waves to create images of your heart. It provides your doctor with information about the size and shape of your heart and how well your heart's chambers and valves are working. This procedure takes approximately one hour. There are no restrictions for this procedure.  Your physician has recommended that you wear an event monitor for 30 days. Event monitors are medical devices that record the heart's electrical activity. Doctors most often [place] these monitors to diagnose arrhythmias. Arrhythmias are problems with the speed or rhythm of the heartbeat. The monitor is a small, portable device. You can wear one while you do your normal daily activities. This is usually used to diagnose what is causing palpitations/syncope (passing out).    Follow-Up: At [facility], you and your health needs are our priority.  As part of our continuing mission to provide you with exceptional heart care, we have created designated Provider Care Teams.  These Care Teams include your primary Cardiologist (physician) and Advanced Practice Providers (APPs -  Physician Assistants and Nurse Practitioners) who all work together to provide you with the care you need, when you need it.   Your next appointment:   2 month(s)  The format for your next appointment:    In Person  Provider:   Weston Brass, MD   Other Instructions   [employer] Cardiac Event Monitor Instructions Your physician has requested you wear your cardiac event monitor for 30 days. [employer] may call or text to confirm a shipping address. The monitor will be sent to a land address via UPS. [employer] will not ship a monitor to a PO BOX. It typically takes 3-5 days to receive your monitor after it has been enrolled. [employer] will assist with USPS tracking if your package is delayed. The telephone number for [employer] is [phone]. Once you have received your monitor, please review the enclosed instructions. Instruction tutorials can also be viewed under help and settings on the enclosed cell phone. Your monitor has already been registered assigning a specific monitor serial # to you.  Applying the monitor Remove cell phone from case and turn it on. The cell phone works as IT consultant and needs to be within [employer] of you at all times. The cell phone will need to be charged on a daily basis. We recommend you plug the cell phone into the enclosed charger at your bedside table every night.  Monitor batteries: You will receive two monitor batteries labelled #1 and #2. These are your recorders. Plug battery #2 onto the second connection on the enclosed charger. Keep one battery on the charger at all times. This will keep the monitor battery deactivated. It will also keep it fully charged for when you need to switch your monitor batteries. A small light will be blinking on the battery emblem when it  is charging. The light on the battery emblem will remain on when the battery is fully charged.  Open package of a Monitor strip. Insert battery #1 into black hood on strip and gently squeeze monitor battery onto connection as indicated in instruction booklet. Set aside while preparing skin.  Choose location for your strip, vertical or horizontal, as indicated in the  instruction booklet. Shave to remove all hair from location. There cannot be any lotions, oils, powders, or colognes on skin where monitor is to be applied. Wipe skin clean with enclosed Saline wipe. Dry skin completely.  Peel paper labeled #1 off the back of the Monitor strip exposing the adhesive. Place the monitor on the chest in the vertical or horizontal position shown in the instruction booklet. One arrow on the monitor strip must be pointing upward. Carefully remove paper labeled #2, attaching remainder of strip to your skin. Try not to create any folds or wrinkles in the strip as you apply it.  Firmly press and release the circle in the center of the monitor battery. You will hear a small beep. This is turning the monitor battery on. The heart emblem on the monitor battery will light up every 5 seconds if the monitor battery in turned on and connected to the patient securely. Do not push and hold the circle down as this turns the monitor battery off. The cell phone will locate the monitor battery. A screen will appear on the cell phone checking the connection of your monitor strip. This may read poor connection initially but change to good connection within the next minute. Once your monitor accepts the connection you will hear a series of 3 beeps followed by a climbing crescendo of beeps. A screen will appear on the cell phone showing the two monitor strip placement options. Touch the picture that demonstrates where you applied the monitor strip.  Your monitor strip and battery are waterproof. You are able to shower, bathe, or swim with the monitor on. They just ask you do not submerge deeper than 3 feet underwater. We recommend removing the monitor if you are swimming in a lake, river, or ocean.  Your monitor battery will need to be switched to a fully charged monitor battery approximately once a week. The cell phone will alert you of an action which needs to be made.  On the cell  phone, tap for details to reveal connection status, monitor battery status, and cell phone battery status. The green dots indicates your monitor is in good status. A red dot indicates there is something that needs your attention.  To record a symptom, click the circle on the monitor battery. In 30-60 seconds a list of symptoms will appear on the cell phone. Select your symptom and tap save. Your monitor will record a sustained or significant arrhythmia regardless of you clicking the button. Some patients do not feel the heart rhythm irregularities. [employer] will notify us of any serious or critical events.  Refer to instruction booklet for instructions on switching batteries, changing strips, the Do not disturb or Pause features, or any additional questions.  Call [employer] at [phone], to confirm your monitor is transmitting and record your baseline. They will answer any questions you may have regarding the monitor instructions at that time.  Returning the monitor to [employer] Place all equipment back into blue box. Peel off strip of paper to expose adhesive and close box securely. There is a prepaid UPS shipping label on this box. Drop in a UPS  drop box, or at a UPS facility like [employer]. You may also contact [employer] to arrange UPS to pick up monitor package at your home.

## 2020-07-04 NOTE — Progress Notes (Signed)
Cardiology Office Note:    Date:  07/04/2020   ID:  Andrea [name], DOB [birth year], MRN [id number]  PCP:  Carlean Jews, NP  Cardiologist:  No primary care provider on file.  Electrophysiologist:  None   Referring MD: Drema Dallas, DO   Chief Complaint: recurrent syncope  History of Present Illness:    Andrea [name] is a 39 y.o. 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   At 39 yo, she was sitting at thanksgiving dinner, sitting in chair and felt pressure in head like a balloon blowing up, passed out in chair.   Gets dizzy - did vertigo tests in past and normal. No prior cardiovascular evaluation.  April '21, had syncope-  Went to get up and stood up and felt dizzy - feels like she's in a dark place. If she does not stop right there she will faint. Hearing gets impaired and gets in tunnel. Fell to the floor. Passed out and fell on face. Broke left orbit. Can feel heart pounding when in dark tunnel.   Positive orthostatics - in Dr. Moises Blood office.   Smoking - 1/2 ppd - a few years. Trying to quit. No etoh. No drugs. No herbal supplements.   No fhx of of syncope or sudden cardiac death.   Mom - had heart problems and passed from cancer.    Past Medical History:  Diagnosis Date   Abnormal finding on MRI of brain 01/02/2017   Anxiety and depression    see progress notes -sw   Common migraine with intractable migraine 01/02/2017   Polycystic ovarian disease    Psoriasis    Rosacea    Seizure (HCC)    as child from trauma; no meds and no further seizures    Past Surgical History:  Procedure Laterality Date   CESAREAN SECTION N/A 04/24/2016   Procedure: CESAREAN SECTION;  Surgeon: Brock Bad, MD;  Location: [facility] BIRTHING SUITES;  Service: Obstetrics;  Laterality: N/A;   CHOLECYSTECTOMY     TONSILLECTOMY     TONSILLECTOMY AND ADENOIDECTOMY      Current Medications: Current Meds   Medication Sig   albuterol (VENTOLIN HFA) 108 (90 Base) MCG/ACT inhaler Inhale 2 puffs into the lungs every 6 (six) hours as needed for wheezing or shortness of breath.   aspirin-acetaminophen-caffeine (EXCEDRIN MIGRAINE) 250-250-65 MG tablet Take 2 tablets by mouth every 6 (six) hours as needed for headache or migraine.   busPIRone (BUSPAR) 30 MG tablet Take 1 tablet (30 mg total) by mouth in the morning and at bedtime.   cyclobenzaprine (FLEXERIL) 10 MG tablet TAKE 1 TABLET(10 MG) BY MOUTH EVERY 8 HOURS AS NEEDED FOR MUSCLE SPASMS   DULoxetine (CYMBALTA) 60 MG capsule Take 60 mg by mouth daily.   Erenumab-aooe (AIMOVIG ) Inject 150 mg into the skin. daily   gabapentin (NEURONTIN) 600 MG tablet Take 1 tablet (600 mg total) by mouth 3 (three) times daily.   hydrOXYzine (VISTARIL) 50 MG capsule Take 1 capsule (50 mg total) by mouth 2 (two) times daily as needed.   meloxicam (MOBIC) 15 MG tablet TAKE 1 TABLET(15 MG) BY MOUTH DAILY   ondansetron (ZOFRAN ODT) 8 MG disintegrating tablet Take 1 tablet (8 mg total) by mouth every 8 (eight) hours as needed for nausea or vomiting.   PARoxetine (PAXIL) 40 MG tablet Take 1 tablet (40 mg total) by mouth at bedtime.   Rimegepant Sulfate (NURTEC) 75  MG TBDP Take 1 tablet by mouth daily as needed (Maximum 1 tablet in 24 hours).   rizatriptan (MAXALT-MLT) 10 MG disintegrating tablet Take 1 tablet (10 mg total) by mouth as needed for migraine. May repeat in 2 hours if needed   traZODone (DESYREL) 100 MG tablet Take 1-2 tablets (100-200 mg total) by mouth at bedtime. 1-2 tab at bedtime   [DISCONTINUED] DULoxetine (CYMBALTA) 60 MG capsule Take 1 capsule (60 mg total) by mouth 2 (two) times daily. (Patient taking differently: Take 60 mg by mouth daily. )   [DISCONTINUED] Erenumab-aooe (AIMOVIG) 140 MG/ML SOAJ Inject 140 mg into the skin every 28 (twenty-eight) days. (Patient taking differently: Inject 150 mg into the skin every 28 (twenty-eight)  days. )   [DISCONTINUED] fluconazole (DIFLUCAN) 150 MG tablet Take 1 tablet (150 mg total) by mouth every three (3) days as needed.     Allergies:   Codeine, Hydrocodone, Metronidazole, and Morphine and related   Social History   Socioeconomic History   Marital status: Divorced    Spouse name: Not on file   Number of children: 2   Years of education: GED   Highest education level: Not on file  Occupational History   Occupation: N/A  Tobacco Use   Smoking status: Current Some Day Smoker    Packs/day: 0.50    Years: 6.00    Pack years: 3.00    Types: Cigarettes   Smokeless tobacco: Never Used  Building services engineerVaping Use   Vaping Use: Never used  Substance and Sexual Activity   Alcohol use: No   Drug use: No   Sexual activity: Yes    Birth control/protection: None  Other Topics Concern   Not on file  Social History Narrative   Lives at home w/ her children   Right-handed   Caffeine: 1 cup coffee per day   One story home   Social Determinants of Health   Financial Resource Strain:    Difficulty of Paying Living Expenses:   Food Insecurity:    Worried About Programme researcher, broadcasting/film/videounning Out of Food in the Last Year:    Baristaan Out of Food in the Last Year:   Transportation Needs:    Freight forwarderLack of Transportation (Medical):    Lack of Transportation (Non-Medical):   Physical Activity:    Days of Exercise per Week:    Minutes of Exercise per Session:   Stress:    Feeling of Stress :   Social Connections:    Frequency of Communication with Friends and Family:    Frequency of Social Gatherings with Friends and Family:    Attends Religious Services:    Active Member of Clubs or Organizations:    Attends Engineer, structuralClub or Organization Meetings:    Marital Status:      Family History: The patient's family history includes Bone cancer in her mother; Brain cancer in her maternal grandmother; Breast cancer in her maternal grandmother and mother; Colon cancer in her father; Migraines in her maternal  grandmother, mother, and sister; Prostate cancer in her father.  ROS:   Please see the history of present illness.    All other systems reviewed and are negative.  EKGs/Labs/Other Studies Reviewed:    The following studies were reviewed today:  EKG:  NSR, incomplete RBBB QRS duration 104 ms  Recent Labs: 03/14/2020: ALT 14; BUN 7; Creatinine, Ser 0.78; Hemoglobin 14.8; Platelets 298; Potassium 3.7; Sodium 141; TSH 2.710  Recent Lipid Panel    Component Value Date/Time   CHOL 174 03/14/2020 1640  TRIG 178 (H) 03/14/2020 1640   HDL 46 03/14/2020 1640   LDLCALC 97 03/14/2020 1640    Physical Exam:    VS:  BP 112/72 (BP Location: Left Arm, Patient Position: Sitting, Cuff Size: Large)    Pulse 92    Ht 5\' 7"  (1.702 m)    Wt 221 lb 9.6 oz (100.5 kg)    BMI 34.71 kg/m     Wt Readings from Last 5 Encounters:  07/04/20 221 lb 9.6 oz (100.5 kg)  06/16/20 221 lb (100.2 kg)  06/06/20 206 lb (93.4 kg)  04/11/20 219 lb 6.4 oz (99.5 kg)  03/22/20 206 lb (93.4 kg)     Constitutional: No acute distress Eyes: sclera non-icteric, normal conjunctiva and lids ENMT: normal dentition, moist mucous membranes Cardiovascular: regular rhythm, normal rate, no murmurs. S1 and S2 normal. Radial pulses normal bilaterally. No jugular venous distention.  Respiratory: clear to auscultation bilaterally GI : normal bowel sounds, soft and nontender. No distention.   MSK: extremities warm, well perfused. No edema.  NEURO: grossly nonfocal exam, moves all extremities. PSYCH: alert and oriented x 3, normal mood and affect.   ASSESSMENT:    1. Syncope and collapse   2. Dizziness   3. Orthostatic dizziness   4. Bilateral lower extremity edema    PLAN:    Syncope and collapse - Plan: EKG 12-Lead, CARDIAC EVENT MONITOR, ECHOCARDIOGRAM COMPLETE  Dizziness  Orthostatic dizziness  Bilateral lower extremity edema   We reviewed etiologies of syncope extensively and workup to evaluate further. She has  never had a cardiovascular workup per her report. We will obtain an echocardiogram and a cardiac monitor for 30 days to exclude cardiovascular etiologies given hard fall with orbital fracture. Her symptoms seem most consistent with neurocardiogenic etiology with tunnel vision, and prodrome of room going dark. She also has palpitations, will evaluate with monitor. Recommend compression stockings.   Hypertriglyceridemia - encourage dietary modifications: reduce refined carbohydrates and white sugar in diet. Denies ETOH use.   Total time of encounter: 60 minutes total time of encounter, including 35 minutes spent in face-to-face patient care on the date of this encounter. This time includes coordination of care and counseling regarding above mentioned problem list. Remainder of non-face-to-face time involved reviewing chart documents/testing relevant to the patient encounter and documentation in the medical record. I have independently reviewed documentation from referring provider.   03/24/20, MD [place]   [facility]    Medication Adjustments/Labs and Tests Ordered: Current medicines are reviewed at length with the patient today.  Concerns regarding medicines are outlined above.  Orders Placed This Encounter  Procedures   CARDIAC EVENT MONITOR   EKG 12-Lead   ECHOCARDIOGRAM COMPLETE   No orders of the defined types were placed in this encounter.   Patient Instructions  Medication Instructions:  No Changes *If you need a refill on your cardiac medications before your next appointment, please call your pharmacy*   Lab Work: None Ordered If you have labs (blood work) drawn today and your tests are completely normal, you will receive your results only by:  MyChart Message (if you have MyChart) OR  A paper copy in the mail If you have any lab test that is abnormal or we need to change your treatment, we will call you to review the results.   Testing/Procedures: This  will be scheduled at [address].  Your physician has requested that you have an echocardiogram. Echocardiography is a painless test  that uses sound waves to create images of your heart. It provides your doctor with information about the size and shape of your heart and how well your hearts chambers and valves are working. This procedure takes approximately one hour. There are no restrictions for this procedure.  Your physician has recommended that you wear an event monitor for 30 days. Event monitors are medical devices that record the hearts electrical activity. Doctors most often [place] these monitors to diagnose arrhythmias. Arrhythmias are problems with the speed or rhythm of the heartbeat. The monitor is a small, portable device. You can wear one while you do your normal daily activities. This is usually used to diagnose what is causing palpitations/syncope (passing out).    Follow-Up: At [facility], you and your health needs are our priority.  As part of our continuing mission to provide you with exceptional heart care, we have created designated Provider Care Teams.  These Care Teams include your primary Cardiologist (physician) and Advanced Practice Providers (APPs -  Physician Assistants and Nurse Practitioners) who all work together to provide you with the care you need, when you need it.   Your next appointment:   2 month(s)  The format for your next appointment:   In Person  Provider:   [address]on [address], MD   Other Instructions   [employer] Cardiac Event Monitor Instructions Your physician has requested you wear your cardiac event monitor for 30 days. [employer] may call or text to confirm a shipping address. The monitor will be sent to a land address via UPS. [employer] will not ship a monitor to a PO BOX. It typically takes 3-5 days to receive your monitor after it has been enrolled. [employer] will assist with USPS tracking if your package is delayed. The  telephone number for [employer] is [phone]. Once you have received your monitor, please review the enclosed instructions. Instruction tutorials can also be viewed under help and settings on the enclosed cell phone. Your monitor has already been registered assigning a specific monitor serial # to you.  Applying the monitor Remove cell phone from case and turn it on. The cell phone works as IT consultant and needs to be within [employer] of you at all times. The cell phone will need to be charged on a daily basis. We recommend you plug the cell phone into the enclosed charger at your bedside table every night.  Monitor batteries: You will receive two monitor batteries labelled #1 and #2. These are your recorders. Plug battery #2 onto the second connection on the enclosed charger. Keep one battery on the charger at all times. This will keep the monitor battery deactivated. It will also keep it fully charged for when you need to switch your monitor batteries. A small light will be blinking on the battery emblem when it is charging. The light on the battery emblem will remain on when the battery is fully charged.  Open package of a Monitor strip. Insert battery #1 into black hood on strip and gently squeeze monitor battery onto connection as indicated in instruction booklet. Set aside while preparing skin.  Choose location for your strip, vertical or horizontal, as indicated in the instruction booklet. Shave to remove all hair from location. There cannot be any lotions, oils, powders, or colognes on skin where monitor is to be applied. Wipe skin clean with enclosed Saline wipe. Dry skin completely.  Peel paper labeled #1 off the back of the Monitor strip exposing the adhesive. Place the monitor on  the chest in the vertical or horizontal position shown in the instruction booklet. One arrow on the monitor strip must be pointing upward. Carefully remove paper labeled #2, attaching remainder  of strip to your skin. Try not to create any folds or wrinkles in the strip as you apply it.  Firmly press and release the circle in the center of the monitor battery. You will hear a small beep. This is turning the monitor battery on. The heart emblem on the monitor battery will light up every 5 seconds if the monitor battery in turned on and connected to the patient securely. Do not push and hold the circle down as this turns the monitor battery off. The cell phone will locate the monitor battery. A screen will appear on the cell phone checking the connection of your monitor strip. This may read poor connection initially but change to good connection within the next minute. Once your monitor accepts the connection you will hear a series of 3 beeps followed by a climbing crescendo of beeps. A screen will appear on the cell phone showing the two monitor strip placement options. Touch the picture that demonstrates where you applied the monitor strip.  Your monitor strip and battery are waterproof. You are able to shower, bathe, or swim with the monitor on. They just ask you do not submerge deeper than 3 feet underwater. We recommend removing the monitor if you are swimming in a lake, river, or ocean.  Your monitor battery will need to be switched to a fully charged monitor battery approximately once a week. The cell phone will alert you of an action which needs to be made.  On the cell phone, tap for details to reveal connection status, monitor battery status, and cell phone battery status. The green dots indicates your monitor is in good status. A red dot indicates there is something that needs your attention.  To record a symptom, click the circle on the monitor battery. In 30-60 seconds a list of symptoms will appear on the cell phone. Select your symptom and tap save. Your monitor will record a sustained or significant arrhythmia regardless of you clicking the button. Some patients do not  feel the heart rhythm irregularities. [employer] will notify us of any serious or critical events.  Refer to instruction booklet for instructions on switching batteries, changing strips, the Do not disturb or Pause features, or any additional questions.  Call [employer] at [phone], to confirm your monitor is transmitting and record your baseline. They will answer any questions you may have regarding the monitor instructions at that time.  Returning the monitor to [employer] Place all equipment back into blue box. Peel off strip of paper to expose adhesive and close box securely. There is a prepaid UPS shipping label on this box. Drop in a UPS drop box, or at a UPS facility like [employer]. You may also contact [employer] to arrange UPS to pick up monitor package at your home.

## 2020-07-11 ENCOUNTER — Encounter (INDEPENDENT_AMBULATORY_CARE_PROVIDER_SITE_OTHER): Payer: Medicaid Other

## 2020-07-11 DIAGNOSIS — R55 Syncope and collapse: Secondary | ICD-10-CM

## 2020-07-25 ENCOUNTER — Other Ambulatory Visit: Payer: Self-pay | Admitting: Adult Health

## 2020-07-25 DIAGNOSIS — F32A Depression, unspecified: Secondary | ICD-10-CM

## 2020-07-25 DIAGNOSIS — M5442 Lumbago with sciatica, left side: Secondary | ICD-10-CM

## 2020-07-26 ENCOUNTER — Other Ambulatory Visit: Payer: Self-pay

## 2020-07-26 ENCOUNTER — Ambulatory Visit (HOSPITAL_COMMUNITY): Payer: Medicaid Other | Attending: Cardiology

## 2020-07-26 DIAGNOSIS — R55 Syncope and collapse: Secondary | ICD-10-CM | POA: Diagnosis present

## 2020-07-26 LAB — ECHOCARDIOGRAM COMPLETE
Area-P 1/2: 5.66 cm2
S' Lateral: 3.1 cm

## 2020-07-31 ENCOUNTER — Other Ambulatory Visit: Payer: Self-pay | Admitting: Neurology

## 2020-07-31 ENCOUNTER — Telehealth: Payer: Self-pay | Admitting: Neurology

## 2020-07-31 MED ORDER — AIMOVIG 140 MG/ML ~~LOC~~ SOAJ: 140.0000 mg | SUBCUTANEOUS | 5 refills | Status: DC

## 2020-07-31 NOTE — Telephone Encounter (Signed)
Patient called and said she'd like for the prescription for Aimovig to be sent to her pharmacy again for a PA to get done.  She may need more samples to last until the medication is approved, if possible.  [facility] on corner of [address] in [place]

## 2020-07-31 NOTE — Telephone Encounter (Signed)
Dr. Everlena Cooper can you send in a new script to the pharmacy. Can pt have a Sample until this is approved?  Chelsea can you start a PA for this pt.

## 2020-07-31 NOTE — Telephone Encounter (Signed)
Prescription for Aimovig 140mg  sent to her pharmacy.  Ok for her to pick up samples.

## 2020-08-01 ENCOUNTER — Encounter: Payer: Self-pay | Admitting: Neurology

## 2020-08-01 NOTE — Progress Notes (Signed)
Subjective:    Andrea [name] (Key: [id number]) Aimovig 140MG /ML auto-injectors   Form [employer] of Prior Authorization Request Form [phone] NCPDP) Created 2 hours ago Sent to Plan 2 hours ago Plan Response 2 hours ago Submit Clinical Questions 2 hours ago Determination Favorable 1 hour ago Message from Plan Approved. This drug has been approved. Approved quantity: 1 MLs per 30 day(s). You may fill up to a 34 day supply at a retail pharmacy. You may fill up to a 90 day supply for maintenance drugs, please refer to the formulary for details. Please call the pharmacy to process your prescription claim.

## 2020-08-01 NOTE — Progress Notes (Signed)
Andrea [name] (Key: [id number]) Aimovig 140MG /ML auto-injectors   Form [employer] of Prior Authorization Request Form [phone] NCPDP) Created 9 minutes ago Sent to Plan 7 minutes ago Plan Response 7 minutes ago Submit Clinical Questions less than a minute ago Determination Wait for Determination Please wait for [facility] Medicaid 2017 to return a determination.

## 2020-08-01 NOTE — Telephone Encounter (Signed)
Just received Aimovig Approval. This info has been updated in her chart. Thanks!

## 2020-08-08 ENCOUNTER — Encounter: Payer: Self-pay | Admitting: Neurology

## 2020-08-08 NOTE — Progress Notes (Addendum)
Andrea [name] (Key: [id number]) Nurtec 75MG  dispersible tablets   Form [employer] of Prior Authorization Request Form [id number] NCPDP) Created 26 minutes ago Sent to Plan 24 minutes ago Plan Response 24 minutes ago Submit Clinical Questions 22 minutes ago Determination Favorable 1 minute ago Message from Plan Approved. This drug has been approved. Approved quantity: 8 tablets per 30 day(s). You may fill up to a 34 day supply at a retail pharmacy. You may fill up to a 90 day supply for maintenance drugs, please refer to the formulary for details. Please call the pharmacy to process your prescription claim.

## 2020-08-25 ENCOUNTER — Other Ambulatory Visit: Payer: Self-pay | Admitting: Internal Medicine

## 2020-08-25 ENCOUNTER — Other Ambulatory Visit: Payer: Self-pay | Admitting: Adult Health

## 2020-08-25 DIAGNOSIS — F411 Generalized anxiety disorder: Secondary | ICD-10-CM

## 2020-08-28 ENCOUNTER — Other Ambulatory Visit: Payer: Self-pay | Admitting: Adult Health

## 2020-08-28 DIAGNOSIS — F411 Generalized anxiety disorder: Secondary | ICD-10-CM

## 2020-09-01 ENCOUNTER — Ambulatory Visit: Payer: Medicaid Other | Admitting: Internal Medicine

## 2020-09-05 ENCOUNTER — Encounter: Payer: Self-pay | Admitting: Nurse Practitioner

## 2020-09-05 ENCOUNTER — Other Ambulatory Visit: Payer: Self-pay

## 2020-09-05 ENCOUNTER — Ambulatory Visit: Payer: Medicaid Other | Admitting: Nurse Practitioner

## 2020-09-05 VITALS — BP 112/70 | HR 89 | Temp 98.1°F | Resp 16 | Ht 67.0 in | Wt 221.8 lb

## 2020-09-05 DIAGNOSIS — Z1231 Encounter for screening mammogram for malignant neoplasm of breast: Secondary | ICD-10-CM

## 2020-09-05 DIAGNOSIS — B3731 Acute candidiasis of vulva and vagina: Secondary | ICD-10-CM

## 2020-09-05 DIAGNOSIS — B373 Candidiasis of vulva and vagina: Secondary | ICD-10-CM | POA: Diagnosis not present

## 2020-09-05 DIAGNOSIS — Z23 Encounter for immunization: Secondary | ICD-10-CM

## 2020-09-05 DIAGNOSIS — G43009 Migraine without aura, not intractable, without status migrainosus: Secondary | ICD-10-CM | POA: Diagnosis not present

## 2020-09-05 DIAGNOSIS — F332 Major depressive disorder, recurrent severe without psychotic features: Secondary | ICD-10-CM

## 2020-09-05 DIAGNOSIS — Z6834 Body mass index (BMI) 34.0-34.9, adult: Secondary | ICD-10-CM

## 2020-09-05 MED ORDER — TOPIRAMATE 25 MG PO TABS: ORAL_TABLET | ORAL | 1 refills | Status: DC

## 2020-09-05 MED ORDER — FLUCONAZOLE 150 MG PO TABS: ORAL_TABLET | ORAL | 1 refills | Status: DC

## 2020-09-05 NOTE — Progress Notes (Signed)
[facility] [address] [place], [place] [address]  Internal MEDICINE  Office Visit Note  Patient Name: Andrea [name]  [id number]  [id number]  Date of Service: 09/23/2020  Chief Complaint  Patient presents with  . Follow-up    schedule mammogram  . Depression  . Anxiety  . Quality Metric Gaps    covid, flu,Hep C  . other    controlled substance form given to PT    The patient is here for routine follow up visit. The patient was started on aimovig monthly to help reduce the frequency and severity of migraine headaches. She states that she has had some relief from daily headaches, however, she still has intermittent headaches. She can take Nurtec or maxalt when needed for acute headaches and these medications do alleviate the headaches. The patient is concerned about continued weight gain. She has gained two pounds since her most recent visit. She has tried different medications for weight management in the past. She did well with them. Discussed risk factors and negative side effects associated with stimulant medication for weight management. Due to underlying neurological condition and migraines. We discussed other treatments for weight management other than stimulant medication.  She should have a screening mammogram. She has a strong family history of breast cancer. patient has already started having screening mammogram.         Current Medication: Outpatient Encounter Medications as of 09/05/2020  Medication Sig  . albuterol (VENTOLIN HFA) 108 (90 Base) MCG/ACT inhaler Inhale 2 puffs into the lungs every 6 (six) hours as needed for wheezing or shortness of breath.  Marland Kitchen aspirin-acetaminophen-caffeine (EXCEDRIN MIGRAINE) 250-250-65 MG tablet Take 2 tablets by mouth every 6 (six) hours as needed for headache or migraine.  . busPIRone (BUSPAR) 30 MG tablet TAKE 1 TABLET(30 MG) BY MOUTH IN THE MORNING AND AT BEDTIME  . cyclobenzaprine (FLEXERIL) 10 MG tablet TAKE 1  TABLET(10 MG) BY MOUTH EVERY 8 HOURS AS NEEDED FOR MUSCLE SPASMS  . DULoxetine (CYMBALTA) 60 MG capsule Take 60 mg by mouth daily.  Dorise Hiss (AIMOVIG) 140 MG/ML SOAJ Inject 140 mg into the skin every 28 (twenty-eight) days.  Marland Kitchen gabapentin (NEURONTIN) 600 MG tablet TAKE 1 TABLET(600 MG) BY MOUTH THREE TIMES DAILY  . hydrOXYzine (VISTARIL) 50 MG capsule Take one tab po qhs prn  . meloxicam (MOBIC) 15 MG tablet TAKE 1 TABLET(15 MG) BY MOUTH DAILY  . ondansetron (ZOFRAN ODT) 8 MG disintegrating tablet Take 1 tablet (8 mg total) by mouth every 8 (eight) hours as needed for nausea or vomiting.  Marland Kitchen PARoxetine (PAXIL) 40 MG tablet Take 1 tablet (40 mg total) by mouth at bedtime.  . Rimegepant Sulfate (NURTEC) 75 MG TBDP Take 1 tablet by mouth daily as needed (Maximum 1 tablet in 24 hours).  . traZODone (DESYREL) 100 MG tablet TAKE 1 TO 2 TABLETS(100 TO 200 MG) BY MOUTH AT BEDTIME  . [DISCONTINUED] rizatriptan (MAXALT-MLT) 10 MG disintegrating tablet Take 1 tablet (10 mg total) by mouth as needed for migraine. May repeat in 2 hours if needed  . fluconazole (DIFLUCAN) 150 MG tablet Take 1 tablet po once. May repeat dose in 3 days as needed for persistent symptoms.  Marland Kitchen topiramate (TOPAMAX) 25 MG tablet Take 1 tablet po QHS for headache prevention   No facility-administered encounter medications on file as of 09/05/2020.    Surgical History: Past Surgical History:  Procedure Laterality Date  . CESAREAN SECTION N/A 04/24/2016   Procedure: CESAREAN SECTION;  Surgeon: Leonette Most  Julio AlmA Harper, MD;  Location: [facility];  Service: Obstetrics;  Laterality: N/A;  . CHOLECYSTECTOMY    . TONSILLECTOMY    . TONSILLECTOMY AND ADENOIDECTOMY      Medical History: Past Medical History:  Diagnosis Date  . Abnormal finding on MRI of brain 01/02/2017  . Anxiety and depression    see progress notes -sw  . Common migraine with intractable migraine 01/02/2017  . Polycystic ovarian disease   . Psoriasis   .  Rosacea   . Seizure (HCC)    as child from trauma; no meds and no further seizures    Family History: Family History  Problem Relation Age of Onset  . Breast cancer Mother   . Bone cancer Mother   . Migraines Mother   . Colon cancer Father   . Prostate cancer Father   . Brain cancer Maternal Grandmother   . Migraines Maternal Grandmother   . Breast cancer Maternal Grandmother   . Migraines Sister     Social History   Socioeconomic History  . Marital status: Divorced    Spouse name: Not on file  . Number of children: 2  . Years of education: GED  . Highest education level: Not on file  Occupational History  . Occupation: N/A  Tobacco Use  . Smoking status: Current Some Day Smoker    Packs/day: 0.50    Years: 6.00    Pack years: 3.00    Types: Cigarettes  . Smokeless tobacco: Never Used  Vaping Use  . Vaping Use: Never used  Substance and Sexual Activity  . Alcohol use: No  . Drug use: No  . Sexual activity: Yes    Birth control/protection: None  Other Topics Concern  . Not on file  Social History Narrative   Lives at home w/ her children   Right-handed   Caffeine: 1 cup coffee per day   One story home   Social Determinants of Health   Financial Resource Strain:   . Difficulty of Paying Living Expenses: Not on file  Food Insecurity:   . Worried About Programme researcher, broadcasting/film/videounning Out of Food in the Last Year: Not on file  . Ran Out of Food in the Last Year: Not on file  Transportation Needs:   . Lack of Transportation (Medical): Not on file  . Lack of Transportation (Non-Medical): Not on file  Physical Activity:   . Days of Exercise per Week: Not on file  . Minutes of Exercise per Session: Not on file  Stress:   . Feeling of Stress : Not on file  Social Connections:   . Frequency of Communication with Friends and Family: Not on file  . Frequency of Social Gatherings with Friends and Family: Not on file  . Attends Religious Services: Not on file  . Active Member of Clubs  or Organizations: Not on file  . Attends BankerClub or Organization Meetings: Not on file  . Marital Status: Not on file  Intimate Partner Violence:   . Fear of Current or Ex-Partner: Not on file  . Emotionally Abused: Not on file  . Physically Abused: Not on file  . Sexually Abused: Not on file      Review of Systems  Constitutional: Negative for chills, fatigue and unexpected weight change.       Two pound weight gain since her last visit.   HENT: Negative for congestion, postnasal drip, rhinorrhea, sneezing and sore throat.   Respiratory: Negative for cough, chest tightness, shortness of breath and  wheezing.   Cardiovascular: Negative for chest pain and palpitations.  Gastrointestinal: Negative for abdominal pain, constipation, diarrhea, nausea and vomiting.  Endocrine: Negative for cold intolerance, heat intolerance, polydipsia and polyuria.  Genitourinary: Positive for vaginal discharge.       Vaginal itching and irritation.   Musculoskeletal: Negative for arthralgias, back pain, joint swelling and neck pain.  Skin: Negative for rash.  Allergic/Immunologic: Negative for environmental allergies.  Neurological: Positive for headaches. Negative for dizziness, tremors and numbness.  Hematological: Negative for adenopathy. Does not bruise/bleed easily.  Psychiatric/Behavioral: Positive for dysphoric mood. Negative for behavioral problems (Depression), sleep disturbance and suicidal ideas. The patient is nervous/anxious.     Today's Vitals   09/05/20 1110  BP: 112/70  Pulse: 89  Resp: 16  Temp: 98.1 F (36.7 C)  SpO2: 99%  Weight: 221 lb 12.8 oz (100.6 kg)  Height: 5\' 7"  (1.702 m)   Body mass index is 34.74 kg/m.  Physical Exam Vitals and nursing note reviewed.  Constitutional:      General: She is not in acute distress.    Appearance: Normal appearance. She is well-developed. She is obese. She is not diaphoretic.  HENT:     Head: Normocephalic and atraumatic.     Nose:  Nose normal.     Mouth/Throat:     Pharynx: No oropharyngeal exudate.  Eyes:     Pupils: Pupils are equal, round, and reactive to light.  Neck:     Thyroid: No thyromegaly.     Vascular: No JVD.     Trachea: No tracheal deviation.  Cardiovascular:     Rate and Rhythm: Normal rate and regular rhythm.     Heart sounds: Normal heart sounds. No murmur heard.  No friction rub. No gallop.   Pulmonary:     Effort: Pulmonary effort is normal. No respiratory distress.     Breath sounds: Normal breath sounds. No wheezing or rales.  Chest:     Chest wall: No tenderness.  Abdominal:     Palpations: Abdomen is soft.  Musculoskeletal:        General: Normal range of motion.     Cervical back: Normal range of motion and neck supple.  Lymphadenopathy:     Cervical: No cervical adenopathy.  Skin:    General: Skin is warm and dry.  Neurological:     General: No focal deficit present.     Mental Status: She is alert and oriented to person, place, and time.     Cranial Nerves: No cranial nerve deficit.  Psychiatric:        Mood and Affect: Mood normal.        Behavior: Behavior normal.        Thought Content: Thought content normal.        Judgment: Judgment normal.    Assessment/Plan: 1. Migraine without aura and without status migrainosus, not intractable Improved with addition of aimovig. Continue monthly. Use abortive therapy as needed and as prescribed. Add topamax 25mg  every evening to alleviate acute headaches. Add topamax 25mg  every evening to help control frequency and severity of migraines.  - topiramate (TOPAMAX) 25 MG tablet; Take 1 tablet po QHS for headache prevention  Dispense: 30 tablet; Refill: 1  2. BMI 34.0-34.9,adult Discussed addition of topamax 25mg  every evening to help with weight management as well as migraine control. Encouraged her to limit calorie intake to 1200 to 1500 calories per day. She should incorporate exercise into her daily routine as tolerated.   3.  Severe episode of recurrent major depressive disorder, without psychotic features (HCC) Stable. Continue duloxetine as prescribed   4. Vaginal candida Start diflucan 150mg  once. May repeat dose in three days for persistent yeast infection.  - fluconazole (DIFLUCAN) 150 MG tablet; Take 1 tablet po once. May repeat dose in 3 days as needed for persistent symptoms.  Dispense: 3 tablet; Refill: 1  5. Needs flu shot Flu vaccine administered in the office today.  - Flu Vaccine MDCK QUAD PF  6. Encounter for screening mammogram for malignant neoplasm of breast Strong family history of breast cancer. Screening mammogram ordered . - MM 3D SCREEN BREAST BILATERAL; Future  General Counseling: Makhayla verbalizes understanding of the findings of todays visit and agrees with plan of treatment. I have discussed any further diagnostic evaluation that may be needed or ordered today. We also reviewed her medications today. she has been encouraged to call the office with any questions or concerns that should arise related to todays visit.  This patient was seen by Shanda Bumps FNP Collaboration with Dr Vincent Gros as a part of collaborative care agreement  Orders Placed This Encounter  Procedures  . MM 3D SCREEN BREAST BILATERAL  . Flu Vaccine MDCK QUAD PF    Meds ordered this encounter  Medications  . topiramate (TOPAMAX) 25 MG tablet    Sig: Take 1 tablet po QHS for headache prevention    Dispense:  30 tablet    Refill:  1    Order Specific Question:   Supervising Provider    Answer:   Lyndon Code [[id number]]  . fluconazole (DIFLUCAN) 150 MG tablet    Sig: Take 1 tablet po once. May repeat dose in 3 days as needed for persistent symptoms.    Dispense:  3 tablet    Refill:  1    Order Specific Question:   Supervising Provider    Answer:   Lyndon Code [[id number]]    Total time spent: 45 Minutes   Time spent includes review of chart, medications, test results, and follow up plan with the  patient.      Dr Lyndon Code Internal medicine

## 2020-09-23 DIAGNOSIS — G43009 Migraine without aura, not intractable, without status migrainosus: Secondary | ICD-10-CM | POA: Insufficient documentation

## 2020-09-23 DIAGNOSIS — Z6834 Body mass index (BMI) 34.0-34.9, adult: Secondary | ICD-10-CM | POA: Insufficient documentation

## 2020-09-23 DIAGNOSIS — Z23 Encounter for immunization: Secondary | ICD-10-CM | POA: Insufficient documentation

## 2020-09-23 DIAGNOSIS — Z1231 Encounter for screening mammogram for malignant neoplasm of breast: Secondary | ICD-10-CM | POA: Insufficient documentation

## 2020-09-26 ENCOUNTER — Other Ambulatory Visit: Payer: Self-pay

## 2020-09-26 DIAGNOSIS — G8929 Other chronic pain: Secondary | ICD-10-CM

## 2020-09-26 DIAGNOSIS — F411 Generalized anxiety disorder: Secondary | ICD-10-CM

## 2020-09-26 DIAGNOSIS — R059 Cough, unspecified: Secondary | ICD-10-CM

## 2020-09-26 DIAGNOSIS — F32A Depression, unspecified: Secondary | ICD-10-CM

## 2020-09-26 MED ORDER — CYCLOBENZAPRINE HCL 10 MG PO TABS: ORAL_TABLET | ORAL | 0 refills | Status: DC

## 2020-09-26 MED ORDER — ALBUTEROL SULFATE HFA 108 (90 BASE) MCG/ACT IN AERS: 2.0000 | INHALATION_SPRAY | Freq: Four times a day (QID) | RESPIRATORY_TRACT | 3 refills | Status: DC | PRN

## 2020-09-27 ENCOUNTER — Other Ambulatory Visit: Payer: Self-pay

## 2020-09-27 DIAGNOSIS — M5442 Lumbago with sciatica, left side: Secondary | ICD-10-CM

## 2020-09-27 DIAGNOSIS — G8929 Other chronic pain: Secondary | ICD-10-CM

## 2020-09-27 DIAGNOSIS — F32A Depression, unspecified: Secondary | ICD-10-CM

## 2020-09-27 MED ORDER — GABAPENTIN 600 MG PO TABS: ORAL_TABLET | ORAL | 0 refills | Status: DC

## 2020-10-01 ENCOUNTER — Emergency Department (HOSPITAL_COMMUNITY)
Admission: EM | Admit: 2020-10-01 | Discharge: 2020-10-01 | Disposition: A | Discharge status: Home / Self Care | Payer: Medicaid Other | Attending: Emergency Medicine | Admitting: Emergency Medicine

## 2020-10-01 ENCOUNTER — Other Ambulatory Visit: Payer: Self-pay

## 2020-10-01 DIAGNOSIS — R0602 Shortness of breath: Secondary | ICD-10-CM | POA: Insufficient documentation

## 2020-10-01 DIAGNOSIS — Z7982 Long term (current) use of aspirin: Secondary | ICD-10-CM | POA: Diagnosis not present

## 2020-10-01 DIAGNOSIS — F17210 Nicotine dependence, cigarettes, uncomplicated: Secondary | ICD-10-CM | POA: Diagnosis not present

## 2020-10-01 DIAGNOSIS — T43226A Underdosing of selective serotonin reuptake inhibitors, initial encounter: Secondary | ICD-10-CM | POA: Insufficient documentation

## 2020-10-01 DIAGNOSIS — R202 Paresthesia of skin: Secondary | ICD-10-CM | POA: Insufficient documentation

## 2020-10-01 DIAGNOSIS — F32A Depression, unspecified: Secondary | ICD-10-CM

## 2020-10-01 DIAGNOSIS — Y636 Underdosing and nonadministration of necessary drug, medicament or biological substance: Secondary | ICD-10-CM | POA: Diagnosis not present

## 2020-10-01 DIAGNOSIS — T43205A Adverse effect of unspecified antidepressants, initial encounter: Secondary | ICD-10-CM

## 2020-10-01 DIAGNOSIS — Z760 Encounter for issue of repeat prescription: Secondary | ICD-10-CM | POA: Diagnosis not present

## 2020-10-01 DIAGNOSIS — F410 Panic disorder [episodic paroxysmal anxiety] without agoraphobia: Secondary | ICD-10-CM | POA: Diagnosis present

## 2020-10-01 DIAGNOSIS — F411 Generalized anxiety disorder: Secondary | ICD-10-CM

## 2020-10-01 MED ORDER — PAROXETINE HCL 40 MG PO TABS: 40.0000 mg | ORAL_TABLET | Freq: Every day | ORAL | 0 refills | Status: DC

## 2020-10-01 MED ORDER — BUSPIRONE HCL 30 MG PO TABS: 30.0000 mg | ORAL_TABLET | Freq: Every day | ORAL | 0 refills | Status: DC

## 2020-10-01 NOTE — ED Notes (Signed)
Pt verbalizes understanding discharge instructions. Assisted to phone in lobby to call for a ride.

## 2020-10-01 NOTE — ED Provider Notes (Signed)
[facility]-EMERGENCY DEPT Provider Note   CSN: [id number] Arrival date & time: 10/01/20  1149     History Chief Complaint  Patient presents with  . Anxiety  . Medication Refill    Andrea [name] is a 39 y.o. female.  HPI  Patient is a 39 year old female presented today for what she describes as an anxiety attack.  She states that she woke up this morning and felt somewhat short of breath her face was tingling in her hands were tingling she denies any chest pain or lightheadedness or dizziness.  She states she used her inhaler but that seemed to make her somewhat more anxious feeling.  She states that it was an albuterol inhaler that she was prescribed for asthma however she states that she normally does not have to use it.  She states that she has been off her BuSpar and Paxil since Friday.  She has had some mild nausea but no abdominal pain chest pain or vomiting or nausea or diarrhea.  She states she has been on Paxil and BuSpar for 2 years.  She states that she called her primary care doctor on the ninth was told that he no longer works at the clinic and noted the clinic was willing to prescribe her medications for her.  Notably patient does take other serotonergic medications she takes duloxetine, Vistaril has been taking these medications for greater than 5 years.  She denies any associated symptoms states that her symptoms resolved by the time she came here per EMS it took her 40 minutes to "calm down".  Feels well currently but somewhat anxious.     Past Medical History:  Diagnosis Date  . Abnormal finding on MRI of brain 01/02/2017  . Anxiety and depression    see progress notes -sw  . Common migraine with intractable migraine 01/02/2017  . Polycystic ovarian disease   . Psoriasis   . Rosacea   . Seizure [facility])    as child from trauma; no meds and no further seizures    Patient Active Problem List   Diagnosis Date Noted  . Migraine without aura and  without status migrainosus, not intractable 09/23/2020  . BMI 34.0-34.9,adult 09/23/2020  . Needs flu shot 09/23/2020  . Encounter for screening mammogram for malignant neoplasm of breast 09/23/2020  . Syrinx of spinal cord (HCC) 04/02/2020  . Weakness 04/02/2020  . Morbid obesity (HCC) 04/06/2019  . Cough 03/26/2019  . Acute non-recurrent pansinusitis 03/10/2019  . Severe episode of recurrent major depressive disorder, without psychotic features (HCC) 03/10/2019  . Generalized joint pain 03/10/2019  . Primary insomnia 03/10/2019  . Screening for malignant neoplasm of cervix 10/25/2018  . Knee pain 10/23/2018  . Triceps tendinitis 10/23/2018  . Fracture of proximal phalanx of thumb 10/23/2018  . Other constipation 08/11/2018  . Family hx of colon cancer 08/11/2018  . Episode of moderate major depression (HCC) 07/20/2018  . Fatigue 07/20/2018  . Fecal impaction (HCC)   . Obstipation 06/17/2018  . Acute retention of urine 06/17/2018  . Constipation   . Colitis 06/16/2018  . Anxiety and depression 06/16/2018  . Hypokalemia 06/16/2018  . Leukocytosis 06/16/2018  . Acute upper respiratory infection 05/20/2018  . Urinary tract infection with hematuria 05/20/2018  . Dysuria 05/20/2018  . Vaginal candida 05/20/2018  . Bilateral lower extremity edema 05/20/2018  . Other symptoms and signs involving the nervous system 05/20/2018  . Generalized anxiety disorder 05/20/2018  . Encounter for general adult medical examination with abnormal  findings 05/20/2018  . Family history of breast cancer in mother 05/20/2018  . Osteoarthritis of knee 02/28/2017  . Common migraine with intractable migraine 01/02/2017  . White matter 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 01/02/2017  . Postoperative wound cellulitis 05/08/2016  . S/P cesarean section 04/24/2016  . Status post primary low transverse cesarean section 04/24/2016  . Oligohydramnios 04/19/2016  . Supervision of pregnancy with history of pre-term  labor in first trimester 10/31/2015    Past Surgical History:  Procedure Laterality Date  . CESAREAN SECTION N/A 04/24/2016   Procedure: CESAREAN SECTION;  Surgeon: Brock Bad, MD;  Location: [facility] BIRTHING SUITES;  Service: Obstetrics;  Laterality: N/A;  . CHOLECYSTECTOMY    . TONSILLECTOMY    . TONSILLECTOMY AND ADENOIDECTOMY       OB History    Gravida  2   Para  2   Term  0   Preterm  2   AB  0   Living  2     SAB  0   TAB  0   Ectopic  0   Multiple  0   Live Births  2           Family History  Problem Relation Age of Onset  . Breast cancer Mother   . Bone cancer Mother   . Migraines Mother   . Colon cancer Father   . Prostate cancer Father   . Brain cancer Maternal Grandmother   . Migraines Maternal Grandmother   . Breast cancer Maternal Grandmother   . Migraines Sister     Social History   Tobacco Use  . Smoking status: Current Some Day Smoker    Packs/day: 0.50    Years: 6.00    Pack years: 3.00    Types: Cigarettes  . Smokeless tobacco: Never Used  Vaping Use  . Vaping Use: Never used  Substance Use Topics  . Alcohol use: No  . Drug use: No    Home Medications Prior to Admission medications   Medication Sig Start Date End Date Taking? Authorizing Provider  albuterol (VENTOLIN HFA) 108 (90 Base) MCG/ACT inhaler Inhale 2 puffs into the lungs every 6 (six) hours as needed for wheezing or shortness of breath. 09/26/20   Carlean Jews, NP  aspirin-acetaminophen-caffeine (EXCEDRIN MIGRAINE) [phone] MG tablet Take 2 tablets by mouth every 6 (six) hours as needed for headache or migraine.    [provider]  busPIRone (BUSPAR) 30 MG tablet Take 1 tablet (30 mg total) by mouth daily for 12 days. 10/01/20 10/13/20  Gailen Shelter, PA  cyclobenzaprine (FLEXERIL) 10 MG tablet TAKE 1 TABLET(10 MG) BY MOUTH EVERY 8 HOURS AS NEEDED FOR MUSCLE SPASMS 09/26/20   Carlean Jews, NP  DULoxetine (CYMBALTA) 60 MG capsule Take 60  mg by mouth daily.    [provider]  Erenumab-aooe (AIMOVIG) 140 MG/ML SOAJ Inject 140 mg into the skin every 28 (twenty-eight) days. 07/31/20   Everlena Cooper, Adam R, DO  fluconazole (DIFLUCAN) 150 MG tablet Take 1 tablet po once. May repeat dose in 3 days as needed for persistent symptoms. 09/05/20   Carlean Jews, NP  gabapentin (NEURONTIN) 600 MG tablet TAKE 1 TABLET(600 MG) BY MOUTH THREE TIMES DAILY 09/27/20   Carlean Jews, NP  hydrOXYzine (VISTARIL) 50 MG capsule Take one tab po qhs prn 08/25/20   Lyndon Code, MD  meloxicam (MOBIC) 15 MG tablet TAKE 1 TABLET(15 MG) BY MOUTH DAILY 05/23/20   Vincent Gros  E, NP  ondansetron (ZOFRAN ODT) 8 MG disintegrating tablet Take 1 tablet (8 mg total) by mouth every 8 (eight) hours as needed for nausea or vomiting. 06/16/20   Everlena Cooper, Adam R, DO  PARoxetine (PAXIL) 40 MG tablet Take 1 tablet (40 mg total) by mouth at bedtime for 12 days. 10/01/20 10/13/20  Gailen Shelter, PA  Rimegepant Sulfate (NURTEC) 75 MG TBDP Take 1 tablet by mouth daily as needed (Maximum 1 tablet in 24 hours). 06/16/20   Drema Dallas, DO  topiramate (TOPAMAX) 25 MG tablet Take 1 tablet po QHS for headache prevention 09/05/20   Carlean Jews, NP  traZODone (DESYREL) 100 MG tablet TAKE 1 TO 2 TABLETS(100 TO 200 MG) BY MOUTH AT BEDTIME 07/26/20   Johnna Acosta, NP    Allergies    Codeine, Hydrocodone, Metronidazole, and Morphine and related  Review of Systems   Review of Systems  Constitutional: Negative for chills and fever.  HENT: Negative for congestion.   Respiratory: Positive for shortness of breath (resolved ).   Cardiovascular: Negative for chest pain.  Gastrointestinal: Negative for abdominal distention, diarrhea, nausea and vomiting.  Genitourinary: Negative for dysuria.  Neurological: Negative for dizziness and headaches.    Physical Exam Updated Vital Signs BP 121/89 (BP Location: Left Arm)   Pulse 85   Temp 98.7 F (37.1 C) (Oral)   Resp 18    SpO2 100% Comment: Simultaneous filing. User may not have seen previous data.  Physical Exam Vitals and nursing note reviewed.  Constitutional:      General: She is not in acute distress.    Appearance: She is obese.     Comments: Pleasant well-appearing 39 year old.  In no acute distress.  Sitting comfortably in bed.  Able answer questions appropriately follow commands. No increased work of breathing. Speaking in full sentences.  HENT:     Head: Normocephalic and atraumatic.     Nose: Nose normal.  Eyes:     General: No scleral icterus. Cardiovascular:     Rate and Rhythm: Normal rate and regular rhythm.     Pulses: Normal pulses.     Heart sounds: Normal heart sounds.  Pulmonary:     Effort: Pulmonary effort is normal. No respiratory distress.     Breath sounds: No wheezing.  Abdominal:     Palpations: Abdomen is soft.     Tenderness: There is no abdominal tenderness. There is no guarding or rebound.  Musculoskeletal:     Cervical back: Normal range of motion.     Right lower leg: No edema.     Left lower leg: No edema.  Skin:    General: Skin is warm and dry.     Capillary Refill: Capillary refill takes less than 2 seconds.  Neurological:     Mental Status: She is alert. Mental status is at baseline.  Psychiatric:     Comments: Mildly anxious. Otherwise normal behavior. Tearful when describing her sx earlier but consolable.      ED Results / Procedures / Treatments   Labs (all labs ordered are listed, but only abnormal results are displayed) Labs Reviewed - No data to display  EKG None  Radiology No results found.  Procedures Procedures (including critical care time)  Medications Ordered in ED Medications - No data to display  ED Course  I have reviewed the triage vital signs and the nursing notes.  Pertinent labs & imaging results that were available during my care of the patient were  reviewed by me and considered in my medical decision making (see chart  for details).    MDM Rules/Calculators/A&P                          Patient presents today primarily requesting Paxil and BuSpar she has been on these medications for greater than 2 years has been off for approximately 3 days and feels that her shortness of breath and feelings of anxiety this morning are related to this.    Her physical exam is unremarkable.  Her vital signs are within normal limits and she is having some room air she no longer feels short of breath.  She has no chest pain pleuritic or otherwise.  No exertional symptoms.  I suspect that these symptoms are related to serotonin withdrawal will prescribe her 12 days of her Paxil BuSpar she will follow-up with her new PCP in 9 days at her scheduled appointment and have additional prescriptions at that time.  Final Clinical Impression(s) / ED Diagnoses Final diagnoses:  Medication refill  Serotonin withdrawal syndrome, initial encounter    Rx / DC Orders ED Discharge Orders         Ordered    PARoxetine (PAXIL) 40 MG tablet  Daily at bedtime        10/01/20 1244    busPIRone (BUSPAR) 30 MG tablet  Daily        10/01/20 1244           Solon AugustaFondaw, Revecca Nachtigal [place], [place] 10/01/20 1311    Sabas SousBero, Michael M, MD 10/01/20 1507

## 2020-10-01 NOTE — ED Notes (Signed)
Pt needs refills on her Paxil, Meloxicam, and Buspar till the 9th when she seeh a new physician at [facility].

## 2020-10-01 NOTE — Discharge Instructions (Addendum)
Please follow-up with your primary care doctor.  Please take medications as prescribed.  Please return to the emergency department for any new or concerning symptoms. I prescribed you 12 days of medicine to hold you over until your appointment.

## 2020-10-01 NOTE — ED Triage Notes (Signed)
Per EMS- patient c/o anxiety today. EMS states that it took 40 minutes to calm her down. Patient reports that she went to have her antidepressants refilled and they physician she saw had left the group and no one would refill them.

## 2020-10-02 ENCOUNTER — Other Ambulatory Visit: Payer: Self-pay | Admitting: Nurse Practitioner

## 2020-10-02 DIAGNOSIS — F32A Depression, unspecified: Secondary | ICD-10-CM

## 2020-10-02 MED ORDER — PAROXETINE HCL 40 MG PO TABS: 40.0000 mg | ORAL_TABLET | Freq: Every day | ORAL | 3 refills | Status: DC

## 2020-10-02 MED ORDER — DULOXETINE HCL 60 MG PO CPEP: 60.0000 mg | ORAL_CAPSULE | Freq: Two times a day (BID) | ORAL | 3 refills | Status: DC

## 2020-10-04 ENCOUNTER — Other Ambulatory Visit: Payer: Self-pay

## 2020-10-04 ENCOUNTER — Ambulatory Visit
Admission: RE | Admit: 2020-10-04 | Discharge: 2020-10-04 | Disposition: A | Discharge status: Home / Self Care | Payer: Medicaid Other | Source: Ambulatory Visit | Attending: Nurse Practitioner | Admitting: Nurse Practitioner

## 2020-10-04 DIAGNOSIS — Z1231 Encounter for screening mammogram for malignant neoplasm of breast: Secondary | ICD-10-CM | POA: Diagnosis present

## 2020-10-06 NOTE — Progress Notes (Signed)
Negative mammogram

## 2020-10-10 ENCOUNTER — Ambulatory Visit: Payer: Medicaid Other | Admitting: Nurse Practitioner

## 2020-10-10 ENCOUNTER — Encounter: Payer: Self-pay | Admitting: Nurse Practitioner

## 2020-10-10 ENCOUNTER — Other Ambulatory Visit: Payer: Self-pay

## 2020-10-10 VITALS — BP 99/66 | HR 99 | Temp 98.6°F | Resp 16 | Ht 67.0 in | Wt 208.0 lb

## 2020-10-10 DIAGNOSIS — M5441 Lumbago with sciatica, right side: Secondary | ICD-10-CM

## 2020-10-10 DIAGNOSIS — F32A Depression, unspecified: Secondary | ICD-10-CM

## 2020-10-10 DIAGNOSIS — F331 Major depressive disorder, recurrent, moderate: Secondary | ICD-10-CM

## 2020-10-10 DIAGNOSIS — G43009 Migraine without aura, not intractable, without status migrainosus: Secondary | ICD-10-CM

## 2020-10-10 DIAGNOSIS — M5442 Lumbago with sciatica, left side: Secondary | ICD-10-CM

## 2020-10-10 DIAGNOSIS — F411 Generalized anxiety disorder: Secondary | ICD-10-CM

## 2020-10-10 DIAGNOSIS — G8929 Other chronic pain: Secondary | ICD-10-CM

## 2020-10-10 MED ORDER — MELOXICAM 15 MG PO TABS: ORAL_TABLET | ORAL | 3 refills | Status: DC

## 2020-10-10 MED ORDER — PAROXETINE HCL 40 MG PO TABS: 40.0000 mg | ORAL_TABLET | Freq: Every day | ORAL | 3 refills | Status: DC

## 2020-10-10 MED ORDER — BUSPIRONE HCL 30 MG PO TABS: 30.0000 mg | ORAL_TABLET | Freq: Two times a day (BID) | ORAL | 2 refills | Status: DC | PRN

## 2020-10-10 MED ORDER — DULOXETINE HCL 60 MG PO CPEP: 60.0000 mg | ORAL_CAPSULE | Freq: Every day | ORAL | 3 refills | Status: DC

## 2020-10-10 MED ORDER — TOPIRAMATE 25 MG PO TABS: ORAL_TABLET | ORAL | 2 refills | Status: DC

## 2020-10-10 NOTE — Progress Notes (Signed)
[facility] [address] [place], [place] [address]  Internal MEDICINE  Office Visit Note  Patient Name: Andrea [name]  [id number]  [id number]  Date of Service: 10/29/2020  Chief Complaint  Patient presents with  . Follow-up  . Depression  . controlled substance form    reviewed with PT    The patient is here for follow up visit. She has long history of depression/anxiety. While in another state, her provider added paxil to her existing dose duloxetine to help control overall symptoms. She states that she has been doing well and stable with this. We did discuss the increased risks of developing serotonin syndrome while taking two different SSRIs. She voices understanding of the symptoms and will let me know if she develops anything like that. She is taking buspirone 3mg  for acute anxiety. Rarely, she will take hydroxyzine when she feels a panic attack is starting. 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       Current Medication: Outpatient Encounter Medications as of 10/10/2020  Medication Sig  . albuterol (VENTOLIN HFA) 108 (90 Base) MCG/ACT inhaler Inhale 2 puffs into the lungs every 6 (six) hours as needed for wheezing or shortness of breath.  13/08/2020 aspirin-acetaminophen-caffeine (EXCEDRIN MIGRAINE) 250-250-65 MG tablet Take 2 tablets by mouth every 6 (six) hours as needed for headache or migraine.  . busPIRone (BUSPAR) 30 MG tablet Take 1 tablet (30 mg total) by mouth 2 (two) times daily as needed.  . cyclobenzaprine (FLEXERIL) 10 MG tablet TAKE 1 TABLET(10 MG) BY MOUTH EVERY 8 HOURS AS NEEDED FOR MUSCLE SPASMS  . DULoxetine (CYMBALTA) 60 MG capsule Take 1 capsule (60 mg total) by mouth daily.  Marland Kitchen (AIMOVIG) 140 MG/ML SOAJ Inject 140 mg into the skin every 28 (twenty-eight) days.  Dorise Hiss gabapentin (NEURONTIN) 600 MG tablet TAKE 1 TABLET(600 MG) BY MOUTH THREE TIMES DAILY  . hydrOXYzine (VISTARIL) 50 MG capsule  Take one tab po qhs prn  . meloxicam (MOBIC) 15 MG tablet TAKE 1 TABLET(15 MG) BY MOUTH DAILY  . ondansetron (ZOFRAN ODT) 8 MG disintegrating tablet Take 1 tablet (8 mg total) by mouth every 8 (eight) hours as needed for nausea or vomiting.  Marland Kitchen PARoxetine (PAXIL) 40 MG tablet Take 1 tablet (40 mg total) by mouth at bedtime.  . Rimegepant Sulfate (NURTEC) 75 MG TBDP Take 1 tablet by mouth daily as needed (Maximum 1 tablet in 24 hours).  . topiramate (TOPAMAX) 25 MG tablet Take 2 tablets po QHS for migraine prevention  . traZODone (DESYREL) 100 MG tablet TAKE 1 TO 2 TABLETS(100 TO 200 MG) BY MOUTH AT BEDTIME  . [DISCONTINUED] busPIRone (BUSPAR) 30 MG tablet Take 1 tablet (30 mg total) by mouth daily for 12 days.  . [DISCONTINUED] DULoxetine (CYMBALTA) 60 MG capsule Take 1 capsule (60 mg total) by mouth 2 (two) times daily.  . [DISCONTINUED] fluconazole (DIFLUCAN) 150 MG tablet Take 1 tablet po once. May repeat dose in 3 days as needed for persistent symptoms.  . [DISCONTINUED] meloxicam (MOBIC) 15 MG tablet TAKE 1 TABLET(15 MG) BY MOUTH DAILY  . [DISCONTINUED] PARoxetine (PAXIL) 40 MG tablet Take 1 tablet (40 mg total) by mouth at bedtime.  . [DISCONTINUED] topiramate (TOPAMAX) 25 MG tablet Take 1 tablet po QHS for headache prevention   No facility-administered encounter medications on file as of 10/10/2020.    Surgical History: Past Surgical History:  Procedure Laterality Date  . CESAREAN SECTION N/A 04/24/2016  Procedure: CESAREAN SECTION;  Surgeon: Brock Bad, MD;  Location: [facility] BIRTHING SUITES;  Service: Obstetrics;  Laterality: N/A;  . CHOLECYSTECTOMY    . TONSILLECTOMY    . TONSILLECTOMY AND ADENOIDECTOMY      Medical History: Past Medical History:  Diagnosis Date  . Abnormal finding on MRI of brain 01/02/2017  . Anxiety and depression    see progress notes -sw  . Common migraine with intractable migraine 01/02/2017  . Polycystic ovarian disease   . Psoriasis   . Rosacea   .  Seizure (HCC)    as child from trauma; no meds and no further seizures    Family History: Family History  Problem Relation Age of Onset  . Breast cancer Mother        early 66's  . Bone cancer Mother   . Migraines Mother   . Brain cancer Mother   . Colon cancer Father   . Prostate cancer Father   . Brain cancer Maternal Grandmother   . Migraines Maternal Grandmother   . Breast cancer Maternal Grandmother        <50  . Migraines Sister   . Breast cancer Maternal Great-grandmother   . Brain cancer Maternal Great-grandmother     Social History   Socioeconomic History  . Marital status: Divorced    Spouse name: Not on file  . Number of children: 2  . Years of education: GED  . Highest education level: Not on file  Occupational History  . Occupation: N/A  Tobacco Use  . Smoking status: Current Some Day Smoker    Packs/day: 0.50    Years: 6.00    Pack years: 3.00    Types: Cigarettes  . Smokeless tobacco: Never Used  Vaping Use  . Vaping Use: Never used  Substance and Sexual Activity  . Alcohol use: No  . Drug use: No  . Sexual activity: Yes    Birth control/protection: None  Other Topics Concern  . Not on file  Social History Narrative   Lives at home w/ her children   Right-handed   Caffeine: 1 cup coffee per day   One story home   Social Determinants of Health   Financial Resource Strain:   . Difficulty of Paying Living Expenses: Not on file  Food Insecurity:   . Worried About Programme researcher, broadcasting/film/video in the Last Year: Not on file  . Ran Out of Food in the Last Year: Not on file  Transportation Needs:   . Lack of Transportation (Medical): Not on file  . Lack of Transportation (Non-Medical): Not on file  Physical Activity:   . Days of Exercise per Week: Not on file  . Minutes of Exercise per Session: Not on file  Stress:   . Feeling of Stress : Not on file  Social Connections:   . Frequency of Communication with Friends and Family: Not on file  .  Frequency of Social Gatherings with Friends and Family: Not on file  . Attends Religious Services: Not on file  . Active Member of Clubs or Organizations: Not on file  . Attends Banker Meetings: Not on file  . Marital Status: Not on file  Intimate Partner Violence:   . Fear of Current or Ex-Partner: Not on file  . Emotionally Abused: Not on file  . Physically Abused: Not on file  . Sexually Abused: Not on file      Review of Systems  Constitutional: Negative for chills, fatigue and unexpected  weight change.       Thirteen pound weight loss since her last visit .  HENT: Negative for congestion, postnasal drip, rhinorrhea, sneezing and sore throat.   Respiratory: Negative for cough, chest tightness, shortness of breath and wheezing.   Cardiovascular: Negative for chest pain and palpitations.  Gastrointestinal: Negative for abdominal pain, constipation, diarrhea, nausea and vomiting.  Endocrine: Negative for cold intolerance, heat intolerance, polydipsia and polyuria.  Genitourinary: Negative for vaginal discharge.  Musculoskeletal: Negative for arthralgias, back pain, joint swelling and neck pain.  Skin: Negative for rash.  Allergic/Immunologic: Negative for environmental allergies.  Neurological: Positive for headaches. Negative for dizziness, tremors and numbness.  Hematological: Negative for adenopathy. Does not bruise/bleed easily.  Psychiatric/Behavioral: Positive for dysphoric mood. Negative for behavioral problems (Depression), sleep disturbance and suicidal ideas. The patient is nervous/anxious.     Today's Vitals   10/10/20 1152  BP: 99/66  Pulse: 99  Resp: 16  Temp: 98.6 F (37 C)  SpO2: 98%  Weight: 208 lb (94.3 kg)  Height: 5\' 7"  (1.702 m)   Body mass index is 32.58 kg/m.  Physical Exam Vitals and nursing note reviewed.  Constitutional:      General: She is not in acute distress.    Appearance: Normal appearance. She is well-developed. She is  obese. She is not diaphoretic.  HENT:     Head: Normocephalic and atraumatic.     Nose: Nose normal.     Mouth/Throat:     Pharynx: No oropharyngeal exudate.  Eyes:     Pupils: Pupils are equal, round, and reactive to light.  Neck:     Thyroid: No thyromegaly.     Vascular: No JVD.     Trachea: No tracheal deviation.  Cardiovascular:     Rate and Rhythm: Normal rate and regular rhythm.     Heart sounds: Normal heart sounds. No murmur heard.  No friction rub. No gallop.   Pulmonary:     Effort: Pulmonary effort is normal. No respiratory distress.     Breath sounds: Normal breath sounds. No wheezing or rales.  Chest:     Chest wall: No tenderness.  Abdominal:     Palpations: Abdomen is soft.  Musculoskeletal:        General: Normal range of motion.     Cervical back: Normal range of motion and neck supple.  Lymphadenopathy:     Cervical: No cervical adenopathy.  Skin:    General: Skin is warm and dry.  Neurological:     General: No focal deficit present.     Mental Status: She is alert and oriented to person, place, and time.     Cranial Nerves: No cranial nerve deficit.  Psychiatric:        Attention and Perception: Attention and perception normal.        Mood and Affect: Mood is anxious and depressed.        Speech: Speech normal.        Behavior: Behavior normal. Behavior is cooperative.        Thought Content: Thought content normal.        Cognition and Memory: Cognition and memory normal.        Judgment: Judgment normal.    Assessment/Plan: 1. Migraine without aura and without status migrainosus, not intractable Improved control. Continue topamax 25mg , taking two tablets at bedtime. Will adjust dosing as indicated . - topiramate (TOPAMAX) 25 MG tablet; Take 2 tablets po QHS for migraine prevention  Dispense: 60  tablet; Refill: 2  2. Chronic bilateral low back pain with bilateral sciatica May take meloxicam 15mg  daily as needed for pain/inflammation.  -  meloxicam (MOBIC) 15 MG tablet; TAKE 1 TABLET(15 MG) BY MOUTH DAILY  Dispense: 30 tablet; Refill: 3  3. Moderate episode of recurrent major depressive disorder (HCC) Continue paxil 40mg  daily in the evening.  - PARoxetine (PAXIL) 40 MG tablet; Take 1 tablet (40 mg total) by mouth at bedtime.  Dispense: 30 tablet; Refill: 3  4. Generalized anxiety disorder Continue duloxetine 60mg  daily. Take buspirone 30mg  twice daily as needed for acute anxiety.  - busPIRone (BUSPAR) 30 MG tablet; Take 1 tablet (30 mg total) by mouth 2 (two) times daily as needed.  Dispense: 60 tablet; Refill: 2 - DULoxetine (CYMBALTA) 60 MG capsule; Take 1 capsule (60 mg total) by mouth daily.  Dispense: 30 capsule; Refill: 3  General Counseling: Kya verbalizes understanding of the findings of todays visit and agrees with plan of treatment. I have discussed any further diagnostic evaluation that may be needed or ordered today. We also reviewed her medications today. she has been encouraged to call the office with any questions or concerns that should arise related to todays visit.  This patient was seen by FNP Collaboration with Dr as a part of collaborative care agreement  Meds ordered this encounter  Medications  . PARoxetine (PAXIL) 40 MG tablet    Sig: Take 1 tablet (40 mg total) by mouth at bedtime.    Dispense:  30 tablet    Refill:  3    Order Specific Question:   Supervising Provider    Answer:   [[id number]]  . busPIRone (BUSPAR) 30 MG tablet    Sig: Take 1 tablet (30 mg total) by mouth 2 (two) times daily as needed.    Dispense:  60 tablet    Refill:  2    Order Specific Question:   Supervising Provider    Answer:   [[id number]]  . meloxicam (MOBIC) 15 MG tablet    Sig: TAKE 1 TABLET(15 MG) BY MOUTH DAILY    Dispense:  30 tablet    Refill:  3    Order Specific Question:   Supervising Provider    Answer:   Vincent Gros [[id number]]  . topiramate (TOPAMAX) 25 MG  tablet    Sig: Take 2 tablets po QHS for migraine prevention    Dispense:  60 tablet    Refill:  2    Order Specific Question:   Supervising Provider    Answer:   Lyndon Code [[id number]]  . DULoxetine (CYMBALTA) 60 MG capsule    Sig: Take 1 capsule (60 mg total) by mouth daily.    Dispense:  30 capsule    Refill:  3    Update prescription as patient taking only once daily.    Order Specific Question:   Supervising Provider    Answer:   Lyndon Code [[id number]]    Total time spent: 30 Minutes   Time spent includes review of chart, medications, test results, and follow up plan with the patient.      Dr Lyndon Code Internal medicine

## 2020-10-16 ENCOUNTER — Ambulatory Visit: Payer: Medicaid Other | Admitting: Nurse Practitioner

## 2020-10-29 DIAGNOSIS — M5442 Lumbago with sciatica, left side: Secondary | ICD-10-CM | POA: Insufficient documentation

## 2020-10-29 DIAGNOSIS — G8929 Other chronic pain: Secondary | ICD-10-CM | POA: Insufficient documentation

## 2020-10-30 ENCOUNTER — Other Ambulatory Visit: Payer: Self-pay

## 2020-10-30 DIAGNOSIS — F32A Depression, unspecified: Secondary | ICD-10-CM

## 2020-10-30 MED ORDER — TRAZODONE HCL 100 MG PO TABS: ORAL_TABLET | ORAL | 2 refills | Status: DC

## 2020-11-01 ENCOUNTER — Other Ambulatory Visit: Payer: Self-pay

## 2020-11-01 DIAGNOSIS — G8929 Other chronic pain: Secondary | ICD-10-CM

## 2020-11-01 DIAGNOSIS — M5442 Lumbago with sciatica, left side: Secondary | ICD-10-CM

## 2020-11-01 DIAGNOSIS — F32A Depression, unspecified: Secondary | ICD-10-CM

## 2020-11-01 MED ORDER — CYCLOBENZAPRINE HCL 10 MG PO TABS: ORAL_TABLET | ORAL | 1 refills | Status: DC

## 2020-11-28 ENCOUNTER — Telehealth: Payer: Self-pay

## 2020-11-28 NOTE — Telephone Encounter (Signed)
Completed medical record request from [facility] and handed records directly to facility by [facility].

## 2020-11-29 ENCOUNTER — Encounter: Payer: Self-pay | Admitting: Neurology

## 2020-11-29 NOTE — Progress Notes (Signed)
Andrea [name] (Key: Norm Salt) Aimovig 140MG /ML auto-injectors   Form [employer] of Prior Authorization Request Form [phone] NCPDP) Created 2 hours ago Sent to Plan 2 hours ago Plan Response 1 hour ago Submit Clinical Questions 1 hour ago Determination Favorable 5 minutes ago Message from Plan Approved. This drug has been approved. Approved quantity: 1 <> per 30 day(s). You may fill up to a 34 day supply at a retail pharmacy. You may fill up to a 90 day supply for maintenance drugs, please refer to the formulary for details. Please call the pharmacy to process your prescription claim.

## 2020-12-08 ENCOUNTER — Encounter: Payer: Self-pay | Admitting: Neurology

## 2020-12-08 NOTE — Progress Notes (Signed)
Shanda Bumps [place] (Key: [id number]) Nurtec 75MG  dispersible tablets   Form [employer] of Prior Authorization Request Form [phone] NCPDP) Created 38 minutes ago Sent to Plan 38 minutes ago Plan Response 37 minutes ago Submit Clinical Questions 34 minutes ago Determination Favorable 7 minutes ago Message from Plan Approved. This drug has been approved. Approved quantity: 8 <> per 30 day(s). You may fill up to a 34 day supply at a retail pharmacy. You may fill up to a 90 day supply for maintenance drugs, please refer to the formulary for details. Please call the pharmacy to process your prescription claim.

## 2020-12-18 NOTE — Progress Notes (Signed)
Patient not seen.  She did not respond to phone calls or the visit link.   Andrea [name] is a 40 year old right-handed female with with migraines, psoriasis and anxiety and depression who follows up for migraines.  UPDATE: Routine awake and drowsy EEG from 06/26/2020 was normal.  She was referred to cardiology for syncope.  Echocardiogram on 07/26/2020 was normal.  20 day cardiac event monitor showed no findings to explain syncope. MRI was ordered but not performed.    Aimovig was increased to 140mg  in July.  Current NSAIDS:  Mobic Current analgesics:  Excedrin Migraine Current triptans:  Maxalt MLT 10mg  Current ergotamine:  none Current anti-emetic:  none Current muscle relaxants:  Cyclobenzaprine 10mg  Current anti-anxiolytic:  Hydroxyzine, buspirone Current sleep aide:  trazodone Current Antihypertensive medications:  none Current Antidepressant medications:  Cymbalta 60mg  BID Current Anticonvulsant medications:  Gabapentin 600mg  TID Current anti-CGRP:  Aimovig 140mg , Nurtec (rescue) Current Vitamins/Herbal/Supplements:  none Current Antihistamines/Decongestants:  none Other therapy:  none   HISTORY: She has persistent daily headache.  She has had migraines since age 52.  They are severe holocephalic pounding headache with nausea, vomiting, photophobia, phonophobia, osmophobia, blurred vision and extreme fatigue.  They have been lasting 2 to 3 days.  Nurtec sometimes knocks it down to 15 to 20 minutes.   She is extremely drained for the next day.  They occur 4 to 5 a month.    She reports intermittent dizziness since teenager.  If she bends down to pick something up, she gets dizzy.  She explains that she feels like she will pass out.  She has associated diaphoresis, palpitations and tunnel vision.  Sometimes she passes out due to it.  Looking up or reaching up for something may cause it.  She feels fatigue for the rest of that day. She has never had a cardiac workup.  When she  was in her early 68s, she had a brain MRI to evaluate dizziness, which demonstrated nonspecific white matter lesions.  She did not follow up with neurology.  She had history of bilateral hand numbness which she had been told was carpal tunnel syndrome.  Around 2016-2017, she started having increased migraines.  She had a repeat MRI of the brain on 11/24/2016 which showed scattered nonspecific white matter lesions in the bilateral cerebral hemisphere and left cerebellum.  She saw neurology.  Visual evoked potentials were normal.  MRI of cervical spine with and without contrast on 03/17/2017 was unremarkable.  She subsequently had a repeat MRI of brain with and without contrast on 06/03/2018 which was stable compared to prior imaging in December 2017.   She subsequently moved [place] 38[place] in early 2020.  She established care with a neurologist there. 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  She was also diagnosed with fibromyalgia.  She was experiencing numbness and tingling and found to have B1 and B12 deficiency.  She started supplements but later stopped because she started eating better.  Labs from April showed B12 721, folate 10.4 and B1 265.5.  Thyroid panel was normal.   Past NSAIDS:  ibuprofen Past analgesics:  BC powder Past abortive triptans:  Sumatriptan tablet Past abortive ergotamine:  none Past muscle relaxants:  none Past anti-emetic:  Zofran (variable), promethazine, Compazine Past antihypertensive medications:  propranolol Past antidepressant medications:  Lexapro, Paxil, venlafaxine Past anticonvulsant medications:  Topiramate, Depakote Past anti-CGRP:  none  Imaging (personally reviewed): 11/23/2016 MRI BRAIN WO:  1.  No acute intracranial abnormality or mass.  2. Scattered small white matter lesions in both cerebral hemispheres and left cerebellum. These are nonspecific, however demyelinating disease should be considered. Other potential  etiologies include prior infection/inflammation, vasculitis, chronic small vessel ischemia, and sequelae of migraines.  03/16/2017 MRI CERVICAL SPINE W WO:  This is a normal MRI of the cervical spine with and without contrast. Incidental note is made of a mildly prominent central canal from C5-C7 that is unlikely to be clinically significant. 06/03/2018 MRI BRAIN W WO:  1. No acute intracranial abnormality. Stable MRI appearance of the brain since 2017.  2. Unchanged scattered (approximately 7) small nonspecific, nonenhancing T2/FLAIR hyperintense lesions in the cerebral white matter and also the left cerebellum. No new abnormality.    Assessment and Plan:   1.  Migraine without aura, without status migrainosus, not intractable 2.  Chronic daily headache 3.  Abnormal brain MRI.   4.  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  Not seen on previous MRIs performed here in [place].  May be from trauma during a fall when she passed out. 5.  Recurrent syncope.

## 2020-12-19 ENCOUNTER — Telehealth (INDEPENDENT_AMBULATORY_CARE_PROVIDER_SITE_OTHER): Payer: Medicaid Other | Admitting: Neurology

## 2020-12-19 DIAGNOSIS — G43009 Migraine without aura, not intractable, without status migrainosus: Secondary | ICD-10-CM

## 2021-01-11 ENCOUNTER — Encounter: Payer: Self-pay | Admitting: Hospice and Palliative Medicine

## 2021-01-11 ENCOUNTER — Ambulatory Visit (INDEPENDENT_AMBULATORY_CARE_PROVIDER_SITE_OTHER): Payer: Medicaid Other | Admitting: Hospice and Palliative Medicine

## 2021-01-11 ENCOUNTER — Other Ambulatory Visit: Payer: Self-pay

## 2021-01-11 VITALS — BP 104/69 | HR 88 | Temp 97.6°F | Resp 16 | Ht 67.0 in | Wt 201.4 lb

## 2021-01-11 DIAGNOSIS — Z124 Encounter for screening for malignant neoplasm of cervix: Secondary | ICD-10-CM

## 2021-01-11 DIAGNOSIS — R300 Dysuria: Secondary | ICD-10-CM

## 2021-01-11 DIAGNOSIS — G8929 Other chronic pain: Secondary | ICD-10-CM

## 2021-01-11 DIAGNOSIS — F32A Depression, unspecified: Secondary | ICD-10-CM

## 2021-01-11 DIAGNOSIS — M5442 Lumbago with sciatica, left side: Secondary | ICD-10-CM

## 2021-01-11 DIAGNOSIS — F331 Major depressive disorder, recurrent, moderate: Secondary | ICD-10-CM

## 2021-01-11 DIAGNOSIS — H6690 Otitis media, unspecified, unspecified ear: Secondary | ICD-10-CM | POA: Diagnosis not present

## 2021-01-11 DIAGNOSIS — M5441 Lumbago with sciatica, right side: Secondary | ICD-10-CM

## 2021-01-11 DIAGNOSIS — Z7251 High risk heterosexual behavior: Secondary | ICD-10-CM | POA: Diagnosis not present

## 2021-01-11 DIAGNOSIS — G43009 Migraine without aura, not intractable, without status migrainosus: Secondary | ICD-10-CM

## 2021-01-11 DIAGNOSIS — N926 Irregular menstruation, unspecified: Secondary | ICD-10-CM

## 2021-01-11 DIAGNOSIS — F5101 Primary insomnia: Secondary | ICD-10-CM

## 2021-01-11 DIAGNOSIS — Z202 Contact with and (suspected) exposure to infections with a predominantly sexual mode of transmission: Secondary | ICD-10-CM

## 2021-01-11 DIAGNOSIS — F411 Generalized anxiety disorder: Secondary | ICD-10-CM

## 2021-01-11 LAB — POCT PREGNANCY, URINE

## 2021-01-11 MED ORDER — BUSPIRONE HCL 30 MG PO TABS: 30.0000 mg | ORAL_TABLET | Freq: Two times a day (BID) | ORAL | 2 refills | Status: DC | PRN

## 2021-01-11 MED ORDER — DULOXETINE HCL 60 MG PO CPEP: 60.0000 mg | ORAL_CAPSULE | Freq: Every day | ORAL | 3 refills | Status: DC

## 2021-01-11 MED ORDER — AIMOVIG 140 MG/ML ~~LOC~~ SOAJ: 140.0000 mg | SUBCUTANEOUS | 5 refills | Status: DC

## 2021-01-11 MED ORDER — NURTEC 75 MG PO TBDP: 1.0000 | ORAL_TABLET | Freq: Every day | ORAL | 5 refills | Status: DC | PRN

## 2021-01-11 MED ORDER — TOPIRAMATE 25 MG PO TABS: ORAL_TABLET | ORAL | 2 refills | Status: DC

## 2021-01-11 MED ORDER — PAROXETINE HCL 40 MG PO TABS: 40.0000 mg | ORAL_TABLET | Freq: Every day | ORAL | 3 refills | Status: DC

## 2021-01-11 MED ORDER — TRAZODONE HCL 100 MG PO TABS: ORAL_TABLET | ORAL | 2 refills | Status: DC

## 2021-01-11 MED ORDER — GABAPENTIN 600 MG PO TABS: ORAL_TABLET | ORAL | 0 refills | Status: DC

## 2021-01-11 MED ORDER — CIPROFLOXACIN-DEXAMETHASONE 0.3-0.1 % OT SUSP: 4.0000 [drp] | Freq: Two times a day (BID) | OTIC | 0 refills | Status: DC
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
## 2021-01-14 LAB — NUSWAB VAGINITIS PLUS (VG+)
Candida albicans, NAA: NEGATIVE
Candida glabrata, NAA: NEGATIVE
Chlamydia trachomatis, NAA: NEGATIVE
Neisseria gonorrhoeae, NAA: NEGATIVE
Trich vag by NAA: NEGATIVE

## 2021-01-15 ENCOUNTER — Telehealth: Payer: Self-pay

## 2021-01-15 ENCOUNTER — Other Ambulatory Visit: Payer: Self-pay

## 2021-01-15 DIAGNOSIS — F32A Depression, unspecified: Secondary | ICD-10-CM

## 2021-01-15 DIAGNOSIS — G8929 Other chronic pain: Secondary | ICD-10-CM

## 2021-01-15 LAB — IGP, APTIMA HPV: HPV Aptima: POSITIVE — AB

## 2021-01-15 MED ORDER — CYCLOBENZAPRINE HCL 10 MG PO TABS: ORAL_TABLET | ORAL | 1 refills | Status: DC

## 2021-01-15 NOTE — Telephone Encounter (Signed)
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 STD testing negative.

## 2021-01-15 NOTE — Progress Notes (Signed)
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 STD testing negative.

## 2021-01-15 NOTE — Telephone Encounter (Signed)
PT notified

## 2021-01-19 ENCOUNTER — Encounter: Payer: Self-pay | Admitting: Hospice and Palliative Medicine

## 2021-01-25 ENCOUNTER — Other Ambulatory Visit: Payer: Self-pay | Admitting: Hospice and Palliative Medicine

## 2021-01-25 MED ORDER — FLUCONAZOLE 150 MG PO TABS
ORAL_TABLET | ORAL | 0 refills | Status: DC
Start: 2021-01-25 — End: 2021-04-11

## 2021-02-02 ENCOUNTER — Other Ambulatory Visit: Payer: Self-pay | Admitting: Nurse Practitioner

## 2021-02-02 DIAGNOSIS — R059 Cough, unspecified: Secondary | ICD-10-CM

## 2021-02-07 ENCOUNTER — Other Ambulatory Visit: Payer: Self-pay | Admitting: Nurse Practitioner

## 2021-02-07 DIAGNOSIS — G8929 Other chronic pain: Secondary | ICD-10-CM

## 2021-02-07 DIAGNOSIS — M5442 Lumbago with sciatica, left side: Secondary | ICD-10-CM

## 2021-02-08 ENCOUNTER — Other Ambulatory Visit: Payer: Self-pay | Admitting: Nurse Practitioner

## 2021-02-08 DIAGNOSIS — F331 Major depressive disorder, recurrent, moderate: Secondary | ICD-10-CM

## 2021-02-25 ENCOUNTER — Other Ambulatory Visit: Payer: Self-pay | Admitting: Nurse Practitioner

## 2021-02-25 DIAGNOSIS — M5442 Lumbago with sciatica, left side: Secondary | ICD-10-CM

## 2021-02-25 DIAGNOSIS — G8929 Other chronic pain: Secondary | ICD-10-CM

## 2021-03-01 ENCOUNTER — Other Ambulatory Visit: Payer: Self-pay | Admitting: Hospice and Palliative Medicine

## 2021-03-01 DIAGNOSIS — M5441 Lumbago with sciatica, right side: Secondary | ICD-10-CM

## 2021-03-01 DIAGNOSIS — G8929 Other chronic pain: Secondary | ICD-10-CM

## 2021-03-01 MED ORDER — MELOXICAM 15 MG PO TABS: ORAL_TABLET | ORAL | 3 refills | Status: DC

## 2021-03-09 ENCOUNTER — Other Ambulatory Visit: Payer: Self-pay | Admitting: Hospice and Palliative Medicine

## 2021-03-09 DIAGNOSIS — M5441 Lumbago with sciatica, right side: Secondary | ICD-10-CM

## 2021-03-09 DIAGNOSIS — G8929 Other chronic pain: Secondary | ICD-10-CM

## 2021-03-09 DIAGNOSIS — F331 Major depressive disorder, recurrent, moderate: Secondary | ICD-10-CM

## 2021-03-22 ENCOUNTER — Other Ambulatory Visit: Payer: Self-pay | Admitting: Hospice and Palliative Medicine

## 2021-03-22 DIAGNOSIS — G8929 Other chronic pain: Secondary | ICD-10-CM

## 2021-03-22 DIAGNOSIS — M5442 Lumbago with sciatica, left side: Secondary | ICD-10-CM

## 2021-03-22 DIAGNOSIS — F32A Depression, unspecified: Secondary | ICD-10-CM

## 2021-04-11 ENCOUNTER — Ambulatory Visit: Payer: Medicaid Other | Admitting: Nurse Practitioner

## 2021-04-11 ENCOUNTER — Encounter: Payer: Self-pay | Admitting: Nurse Practitioner

## 2021-04-11 ENCOUNTER — Other Ambulatory Visit: Payer: Self-pay

## 2021-04-11 DIAGNOSIS — Z202 Contact with and (suspected) exposure to infections with a predominantly sexual mode of transmission: Secondary | ICD-10-CM

## 2021-04-11 DIAGNOSIS — M5442 Lumbago with sciatica, left side: Secondary | ICD-10-CM

## 2021-04-11 DIAGNOSIS — F3341 Major depressive disorder, recurrent, in partial remission: Secondary | ICD-10-CM

## 2021-04-11 DIAGNOSIS — R059 Cough, unspecified: Secondary | ICD-10-CM

## 2021-04-11 DIAGNOSIS — Z114 Encounter for screening for human immunodeficiency virus [HIV]: Secondary | ICD-10-CM

## 2021-04-11 DIAGNOSIS — F411 Generalized anxiety disorder: Secondary | ICD-10-CM

## 2021-04-11 DIAGNOSIS — B3731 Acute candidiasis of vulva and vagina: Secondary | ICD-10-CM

## 2021-04-11 DIAGNOSIS — F331 Major depressive disorder, recurrent, moderate: Secondary | ICD-10-CM | POA: Diagnosis not present

## 2021-04-11 DIAGNOSIS — F5101 Primary insomnia: Secondary | ICD-10-CM | POA: Diagnosis not present

## 2021-04-11 DIAGNOSIS — B373 Candidiasis of vulva and vagina: Secondary | ICD-10-CM

## 2021-04-11 DIAGNOSIS — M5441 Lumbago with sciatica, right side: Secondary | ICD-10-CM

## 2021-04-11 DIAGNOSIS — F32A Depression, unspecified: Secondary | ICD-10-CM

## 2021-04-11 DIAGNOSIS — G8929 Other chronic pain: Secondary | ICD-10-CM

## 2021-04-11 DIAGNOSIS — G43009 Migraine without aura, not intractable, without status migrainosus: Secondary | ICD-10-CM

## 2021-04-11 MED ORDER — HYDROXYZINE PAMOATE 50 MG PO CAPS: ORAL_CAPSULE | ORAL | 2 refills | Status: DC

## 2021-04-11 MED ORDER — CLONIDINE HCL 0.1 MG PO TABS: 0.1000 mg | ORAL_TABLET | Freq: Every day | ORAL | 0 refills | Status: DC

## 2021-04-11 MED ORDER — MELOXICAM 15 MG PO TABS
ORAL_TABLET | ORAL | 3 refills | Status: DC
Start: 2021-04-11 — End: 2021-08-24

## 2021-04-11 MED ORDER — GABAPENTIN 600 MG PO TABS: ORAL_TABLET | ORAL | 0 refills | Status: DC

## 2021-04-11 MED ORDER — TOPIRAMATE 25 MG PO TABS: ORAL_TABLET | ORAL | 2 refills | Status: DC

## 2021-04-11 MED ORDER — TRAZODONE HCL 100 MG PO TABS: ORAL_TABLET | ORAL | 2 refills | Status: DC

## 2021-04-11 MED ORDER — FLUCONAZOLE 150 MG PO TABS
ORAL_TABLET | ORAL | 0 refills | Status: DC
Start: 2021-04-11 — End: 2021-08-24

## 2021-04-11 MED ORDER — BUSPIRONE HCL 30 MG PO TABS: 30.0000 mg | ORAL_TABLET | Freq: Two times a day (BID) | ORAL | 2 refills | Status: DC | PRN

## 2021-04-11 MED ORDER — DULOXETINE HCL 60 MG PO CPEP: 60.0000 mg | ORAL_CAPSULE | Freq: Every day | ORAL | 3 refills | Status: DC

## 2021-04-11 MED ORDER — ALBUTEROL SULFATE HFA 108 (90 BASE) MCG/ACT IN AERS: 2.0000 | INHALATION_SPRAY | Freq: Four times a day (QID) | RESPIRATORY_TRACT | 3 refills | Status: DC | PRN

## 2021-04-11 MED ORDER — PAROXETINE HCL 40 MG PO TABS: 40.0000 mg | ORAL_TABLET | Freq: Every day | ORAL | 3 refills | Status: DC

## 2021-04-11 MED ORDER — AIMOVIG 140 MG/ML ~~LOC~~ SOAJ: 140.0000 mg | SUBCUTANEOUS | 5 refills | Status: DC

## 2021-04-11 MED ORDER — CYCLOBENZAPRINE HCL 10 MG PO TABS: ORAL_TABLET | ORAL | 2 refills | Status: DC
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
## 2021-04-12 LAB — HEPB+HEPC+HIV PANEL
HIV Screen 4th Generation wRfx: NONREACTIVE
Hep B C IgM: NEGATIVE
Hep B Core Total Ab: NEGATIVE
Hep B E Ab: NEGATIVE
Hep B E Ag: NEGATIVE
Hep B Surface Ab, Qual: NONREACTIVE
Hep C Virus Ab: <0.1 s/co ratio (ref 0.0–0.9)
Hepatitis B Surface Ag: NEGATIVE

## 2021-04-19 ENCOUNTER — Other Ambulatory Visit: Payer: Self-pay | Admitting: Nurse Practitioner

## 2021-04-19 DIAGNOSIS — F331 Major depressive disorder, recurrent, moderate: Secondary | ICD-10-CM

## 2021-04-19 DIAGNOSIS — F5101 Primary insomnia: Secondary | ICD-10-CM

## 2021-04-19 MED ORDER — TRAZODONE HCL 100 MG PO TABS: ORAL_TABLET | ORAL | 2 refills | Status: DC

## 2021-04-20 ENCOUNTER — Other Ambulatory Visit: Payer: Self-pay

## 2021-04-20 DIAGNOSIS — F5101 Primary insomnia: Secondary | ICD-10-CM

## 2021-04-20 MED ORDER — TRAZODONE HCL 100 MG PO TABS: ORAL_TABLET | ORAL | 2 refills | Status: DC

## 2021-04-27 ENCOUNTER — Ambulatory Visit: Payer: Medicaid Other | Admitting: Nurse Practitioner

## 2021-05-23 ENCOUNTER — Other Ambulatory Visit: Payer: Self-pay

## 2021-05-23 DIAGNOSIS — G8929 Other chronic pain: Secondary | ICD-10-CM

## 2021-05-23 MED ORDER — GABAPENTIN 600 MG PO TABS: ORAL_TABLET | ORAL | 0 refills | Status: DC

## 2021-05-24 ENCOUNTER — Encounter: Payer: Self-pay | Admitting: Nurse Practitioner

## 2021-05-24 ENCOUNTER — Ambulatory Visit: Payer: Medicaid Other | Admitting: Nurse Practitioner

## 2021-05-24 ENCOUNTER — Other Ambulatory Visit: Payer: Self-pay

## 2021-05-24 VITALS — BP 120/76 | HR 90 | Temp 98.6°F | Resp 16 | Ht 68.0 in | Wt 216.0 lb

## 2021-05-24 DIAGNOSIS — F411 Generalized anxiety disorder: Secondary | ICD-10-CM | POA: Diagnosis not present

## 2021-05-24 DIAGNOSIS — R87810 Cervical high risk human papillomavirus (HPV) DNA test positive: Secondary | ICD-10-CM | POA: Diagnosis not present

## 2021-05-24 DIAGNOSIS — F331 Major depressive disorder, recurrent, moderate: Secondary | ICD-10-CM | POA: Diagnosis not present

## 2021-05-24 DIAGNOSIS — G43009 Migraine without aura, not intractable, without status migrainosus: Secondary | ICD-10-CM | POA: Diagnosis not present

## 2021-05-24 MED ORDER — HPV 9-VALENT RECOMB VACCINE IM SUSP: 0.5000 mL | Freq: Once | INTRAMUSCULAR | 0 refills | Status: AC
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
## 2021-05-30 ENCOUNTER — Other Ambulatory Visit: Payer: Self-pay

## 2021-05-30 DIAGNOSIS — M5442 Lumbago with sciatica, left side: Secondary | ICD-10-CM

## 2021-05-30 MED ORDER — CYCLOBENZAPRINE HCL 10 MG PO TABS: ORAL_TABLET | ORAL | 2 refills | Status: DC

## 2021-05-30 NOTE — Telephone Encounter (Signed)
Pt called we need resend her flexeril due to [facility]

## 2021-06-01 DIAGNOSIS — R87810 Cervical high risk human papillomavirus (HPV) DNA test positive: Secondary | ICD-10-CM | POA: Insufficient documentation

## 2021-07-15 ENCOUNTER — Other Ambulatory Visit: Payer: Self-pay | Admitting: Internal Medicine

## 2021-07-15 DIAGNOSIS — F411 Generalized anxiety disorder: Secondary | ICD-10-CM

## 2021-07-17 ENCOUNTER — Telehealth: Payer: Self-pay

## 2021-07-17 NOTE — Telephone Encounter (Signed)
Spoke to pt and asked if she was on clonidine anymore.  Pt informed me that she no longer is taking medication and doesn't like it.

## 2021-07-26 ENCOUNTER — Other Ambulatory Visit: Payer: Self-pay | Admitting: Internal Medicine

## 2021-07-26 DIAGNOSIS — F5101 Primary insomnia: Secondary | ICD-10-CM

## 2021-07-26 DIAGNOSIS — G43009 Migraine without aura, not intractable, without status migrainosus: Secondary | ICD-10-CM

## 2021-07-26 DIAGNOSIS — F411 Generalized anxiety disorder: Secondary | ICD-10-CM

## 2021-07-27 ENCOUNTER — Other Ambulatory Visit: Payer: Self-pay

## 2021-07-27 DIAGNOSIS — F5101 Primary insomnia: Secondary | ICD-10-CM

## 2021-07-27 DIAGNOSIS — G43009 Migraine without aura, not intractable, without status migrainosus: Secondary | ICD-10-CM

## 2021-07-27 DIAGNOSIS — F411 Generalized anxiety disorder: Secondary | ICD-10-CM

## 2021-07-27 MED ORDER — TOPIRAMATE 25 MG PO TABS: ORAL_TABLET | ORAL | 0 refills | Status: DC

## 2021-07-27 MED ORDER — BUSPIRONE HCL 30 MG PO TABS: ORAL_TABLET | ORAL | 0 refills | Status: DC

## 2021-07-27 MED ORDER — TRAZODONE HCL 100 MG PO TABS: 100.0000 mg | ORAL_TABLET | Freq: Every day | ORAL | 0 refills | Status: DC

## 2021-08-03 ENCOUNTER — Other Ambulatory Visit: Payer: Self-pay | Admitting: Internal Medicine

## 2021-08-03 DIAGNOSIS — M5442 Lumbago with sciatica, left side: Secondary | ICD-10-CM

## 2021-08-03 DIAGNOSIS — G8929 Other chronic pain: Secondary | ICD-10-CM

## 2021-08-24 ENCOUNTER — Ambulatory Visit: Payer: Medicaid Other | Admitting: Nurse Practitioner

## 2021-08-24 ENCOUNTER — Other Ambulatory Visit: Payer: Self-pay

## 2021-08-24 ENCOUNTER — Encounter: Payer: Self-pay | Admitting: Nurse Practitioner

## 2021-08-24 VITALS — BP 92/74 | HR 100 | Temp 97.8°F | Resp 16 | Ht 67.5 in | Wt 233.6 lb

## 2021-08-24 DIAGNOSIS — F5101 Primary insomnia: Secondary | ICD-10-CM

## 2021-08-24 DIAGNOSIS — Z1231 Encounter for screening mammogram for malignant neoplasm of breast: Secondary | ICD-10-CM

## 2021-08-24 DIAGNOSIS — E876 Hypokalemia: Secondary | ICD-10-CM

## 2021-08-24 DIAGNOSIS — G8929 Other chronic pain: Secondary | ICD-10-CM

## 2021-08-24 DIAGNOSIS — E519 Thiamine deficiency, unspecified: Secondary | ICD-10-CM

## 2021-08-24 DIAGNOSIS — E559 Vitamin D deficiency, unspecified: Secondary | ICD-10-CM

## 2021-08-24 DIAGNOSIS — B373 Candidiasis of vulva and vagina: Secondary | ICD-10-CM

## 2021-08-24 DIAGNOSIS — Z6836 Body mass index (BMI) 36.0-36.9, adult: Secondary | ICD-10-CM

## 2021-08-24 DIAGNOSIS — E538 Deficiency of other specified B group vitamins: Secondary | ICD-10-CM

## 2021-08-24 DIAGNOSIS — F411 Generalized anxiety disorder: Secondary | ICD-10-CM

## 2021-08-24 DIAGNOSIS — Z1211 Encounter for screening for malignant neoplasm of colon: Secondary | ICD-10-CM

## 2021-08-24 DIAGNOSIS — Z1212 Encounter for screening for malignant neoplasm of rectum: Secondary | ICD-10-CM

## 2021-08-24 DIAGNOSIS — R300 Dysuria: Secondary | ICD-10-CM

## 2021-08-24 DIAGNOSIS — M5441 Lumbago with sciatica, right side: Secondary | ICD-10-CM

## 2021-08-24 DIAGNOSIS — B3731 Acute candidiasis of vulva and vagina: Secondary | ICD-10-CM

## 2021-08-24 DIAGNOSIS — D72828 Other elevated white blood cell count: Secondary | ICD-10-CM

## 2021-08-24 DIAGNOSIS — M5442 Lumbago with sciatica, left side: Secondary | ICD-10-CM

## 2021-08-24 DIAGNOSIS — Z0001 Encounter for general adult medical examination with abnormal findings: Secondary | ICD-10-CM | POA: Diagnosis not present

## 2021-08-24 DIAGNOSIS — E782 Mixed hyperlipidemia: Secondary | ICD-10-CM | POA: Diagnosis not present

## 2021-08-24 DIAGNOSIS — F331 Major depressive disorder, recurrent, moderate: Secondary | ICD-10-CM

## 2021-08-24 DIAGNOSIS — G43009 Migraine without aura, not intractable, without status migrainosus: Secondary | ICD-10-CM

## 2021-08-24 MED ORDER — BUSPIRONE HCL 30 MG PO TABS: ORAL_TABLET | ORAL | 2 refills | Status: DC

## 2021-08-24 MED ORDER — PHENTERMINE HCL 37.5 MG PO CAPS: 37.5000 mg | ORAL_CAPSULE | ORAL | 0 refills | Status: DC

## 2021-08-24 MED ORDER — DULOXETINE HCL 60 MG PO CPEP: 60.0000 mg | ORAL_CAPSULE | Freq: Every day | ORAL | 1 refills | Status: DC

## 2021-08-24 MED ORDER — TRAZODONE HCL 100 MG PO TABS: 200.0000 mg | ORAL_TABLET | Freq: Every day | ORAL | 2 refills | Status: DC

## 2021-08-24 MED ORDER — TOPIRAMATE 25 MG PO TABS: ORAL_TABLET | ORAL | 2 refills | Status: DC

## 2021-08-24 MED ORDER — FLUCONAZOLE 150 MG PO TABS: ORAL_TABLET | ORAL | 0 refills | Status: DC

## 2021-08-24 MED ORDER — GABAPENTIN 600 MG PO TABS: 600.0000 mg | ORAL_TABLET | Freq: Three times a day (TID) | ORAL | 1 refills | Status: DC

## 2021-08-24 MED ORDER — MELOXICAM 15 MG PO TABS: ORAL_TABLET | ORAL | 3 refills | Status: DC

## 2021-08-24 MED ORDER — HPV 9-VALENT RECOMB VACCINE IM SUSP: 0.5000 mL | Freq: Once | INTRAMUSCULAR | 0 refills | Status: AC

## 2021-08-24 MED ORDER — HYDROXYZINE PAMOATE 50 MG PO CAPS: ORAL_CAPSULE | ORAL | 2 refills | Status: DC

## 2021-08-24 MED ORDER — CYCLOBENZAPRINE HCL 10 MG PO TABS: ORAL_TABLET | ORAL | 2 refills | Status: DC

## 2021-08-24 MED ORDER — NURTEC 75 MG PO TBDP: 1.0000 | ORAL_TABLET | Freq: Every day | ORAL | 5 refills | Status: DC | PRN

## 2021-08-24 NOTE — Progress Notes (Signed)
[facility] [place], [place] [address]  Internal MEDICINE  Office Visit Note  Patient Name: Andrea [name]  [id number]  [id number]  Date of Service: 08/24/2021  Chief Complaint  Patient presents with   Annual Exam    Discuss cancer, it runs in her family, discuss vaccine    Weight Loss   Anxiety   Depression    HPI Andrea [name] presents for an annual well visit and physical exam. she has a history of anxiety, depression, seizures, PCOS, migraines, cholecystectomy.  She has an extensive family history of cancer.  Her mom had brain cancer and breast cancer.  She also has skin cancer and bone cancer on her mother side.  Her father was diagnosed with colon cancer at age 43 and had a large amount of his colon removed.  There is also prostate and liver cancer on her father side. She is due for her mammogram and routine colonoscopy.  She is in need of refills of most of her medications.  She has had 3 doses of the COVID-vaccine. -She is interested in losing weight and wants to get back on phentermine.  -She has a history of multiple vitamin deficiencies.   Current Medication: Outpatient Encounter Medications as of 08/24/2021  Medication Sig   albuterol (VENTOLIN HFA) 108 (90 Base) MCG/ACT inhaler Inhale 2 puffs into the lungs every 6 (six) hours as needed for wheezing or shortness of breath.   Erenumab-aooe (AIMOVIG) 140 MG/ML SOAJ Inject 140 mg into the skin every 28 (twenty-eight) days.   hpv 9-valent vaccine (GARDASIL 9) SUSP injection Inject 0.5 mLs into the muscle once for 1 dose.   ondansetron (ZOFRAN ODT) 8 MG disintegrating tablet Take 1 tablet (8 mg total) by mouth every 8 (eight) hours as needed for nausea or vomiting.   phentermine 37.5 MG capsule Take 1 capsule (37.5 mg total) by mouth every morning.   [DISCONTINUED] busPIRone (BUSPAR) 30 MG tablet TAKE 1 TABLET(30 MG) BY MOUTH TWICE DAILY AS NEEDED   [DISCONTINUED] cyclobenzaprine (FLEXERIL) 10 MG tablet  Take 1 tablet by mouth 3 times a day   [DISCONTINUED] DULoxetine (CYMBALTA) 60 MG capsule Take 1 capsule (60 mg total) by mouth daily.   [DISCONTINUED] fluconazole (DIFLUCAN) 150 MG tablet Take one tablet once, may repeat dosing in 3 days for recurring symptoms up to 2 doses.   [DISCONTINUED] gabapentin (NEURONTIN) 600 MG tablet TAKE 1 TABLET BY MOUTH THREE TIMES DAILY   [DISCONTINUED] hydrOXYzine (VISTARIL) 50 MG capsule Take one tab po qhs prn   [DISCONTINUED] meloxicam (MOBIC) 15 MG tablet Take 1 tab po daily   [DISCONTINUED] Rimegepant Sulfate (NURTEC) 75 MG TBDP Take 1 tablet by mouth daily as needed (Maximum 1 tablet in 24 hours).   [DISCONTINUED] topiramate (TOPAMAX) 25 MG tablet TAKE 2 TABLETS BY MOUTH EVERY NIGHT AT BEDTIME FOR MIGRAINE PREVENTION   [DISCONTINUED] traZODone (DESYREL) 100 MG tablet Take 1 tablet (100 mg total) by mouth at bedtime. (Patient taking differently: Take 100 mg by mouth at bedtime. Pt takes 2 at bedtime $RemoveBe'200MG'NlAdvHtqt$  total)   busPIRone (BUSPAR) 30 MG tablet TAKE 1 TABLET(30 MG) BY MOUTH TWICE DAILY AS NEEDED   cyclobenzaprine (FLEXERIL) 10 MG tablet Take 1 tablet by mouth 3 times a day   DULoxetine (CYMBALTA) 60 MG capsule Take 1 capsule (60 mg total) by mouth daily.   fluconazole (DIFLUCAN) 150 MG tablet Take one tablet once, may repeat dosing in 3 days for recurring symptoms up to 2 doses.   gabapentin (  NEURONTIN) 600 MG tablet Take 1 tablet (600 mg total) by mouth 3 (three) times daily.   hydrOXYzine (VISTARIL) 50 MG capsule Take one tab po qhs prn   meloxicam (MOBIC) 15 MG tablet Take 1 tab po daily   Rimegepant Sulfate (NURTEC) 75 MG TBDP Take 1 tablet by mouth daily as needed (Maximum 1 tablet in 24 hours).   topiramate (TOPAMAX) 25 MG tablet TAKE 2 TABLETS BY MOUTH EVERY NIGHT AT BEDTIME FOR MIGRAINE PREVENTION   traZODone (DESYREL) 100 MG tablet Take 2 tablets (200 mg total) by mouth at bedtime.   No facility-administered encounter medications on file as of  08/24/2021.    Surgical History: Past Surgical History:  Procedure Laterality Date   CESAREAN SECTION N/A 04/24/2016   Procedure: CESAREAN SECTION;  Surgeon: Shelly Bombard, MD;  Location: [facility];  Service: Obstetrics;  Laterality: N/A;   CHOLECYSTECTOMY     TONSILLECTOMY     TONSILLECTOMY AND ADENOIDECTOMY      Medical History: Past Medical History:  Diagnosis Date   Abnormal finding on MRI of brain 01/02/2017   Anxiety and depression    see progress notes -sw   Common migraine with intractable migraine 01/02/2017   Polycystic ovarian disease    Psoriasis    Rosacea    Seizure ([place])    as child from trauma; no meds and no further seizures    Family History: Family History  Problem Relation Age of Onset   Cancer Mother    Breast cancer Mother        early 54's   Bone cancer Mother    Migraines Mother    Brain cancer Mother    Cancer Father    Colon cancer Father    Prostate cancer Father    Liver cancer Father    Migraines Sister    Brain cancer Maternal Grandmother    Migraines Maternal Grandmother    Breast cancer Maternal Grandmother        <50   Breast cancer Maternal Great-grandmother    Brain cancer Maternal Great-grandmother     Social History   Socioeconomic History   Marital status: Divorced    Spouse name: Not on file   Number of children: 2   Years of education: GED   Highest education level: Not on file  Occupational History   Occupation: N/A  Tobacco Use   Smoking status: Every Day    Packs/day: 0.50    Years: 6.00    Pack years: 3.00    Types: Cigarettes, E-cigarettes    Last attempt to quit: 12/02/2020    Years since quitting: 0.7   Smokeless tobacco: Never  Vaping Use   Vaping Use: Every day  Substance and Sexual Activity   Alcohol use: No   Drug use: No   Sexual activity: Yes    Birth control/protection: None  Other Topics Concern   Not on file  Social History Narrative   Lives at home w/ her children   Right-handed    Caffeine: 1 cup coffee per day   One story home   Social Determinants of Health   Financial Resource Strain: Not on file  Food Insecurity: Not on file  Transportation Needs: Not on file  Physical Activity: Not on file  Stress: Not on file  Social Connections: Not on file  Intimate Partner Violence: Not on file      Review of Systems  Constitutional:  Negative for activity change, appetite change, chills, fatigue, fever  and unexpected weight change.  HENT: Negative.  Negative for congestion, ear pain, rhinorrhea, sore throat and trouble swallowing.   Eyes: Negative.   Respiratory: Negative.  Negative for cough, chest tightness, shortness of breath and wheezing.   Cardiovascular: Negative.  Negative for chest pain.  Gastrointestinal: Negative.  Negative for abdominal pain, blood in stool, constipation, diarrhea, nausea and vomiting.  Endocrine: Negative.   Genitourinary: Negative.  Negative for difficulty urinating, dysuria, frequency, hematuria and urgency.  Musculoskeletal: Negative.  Negative for arthralgias, back pain, joint swelling, myalgias and neck pain.  Skin: Negative.  Negative for rash and wound.  Allergic/Immunologic: Negative.  Negative for immunocompromised state.  Neurological: Negative.  Negative for dizziness, seizures, numbness and headaches.  Hematological: Negative.   Psychiatric/Behavioral: Negative.  Negative for behavioral problems, self-injury and suicidal ideas. The patient is not nervous/anxious.    Vital Signs: BP 92/74   Pulse 100   Temp 97.8 F (36.6 C)   Resp 16   Ht 5' 7.5" (1.715 m)   Wt 233 lb 9.6 oz (106 kg)   SpO2 98%   BMI 36.05 kg/m    Physical Exam Vitals reviewed.  Constitutional:      General: She is awake. She is not in acute distress.    Appearance: Normal appearance. She is well-developed and well-groomed. She is obese. She is not ill-appearing or diaphoretic.  HENT:     Head: Normocephalic and atraumatic.     Right Ear:  Tympanic membrane, ear canal and external ear normal.     Left Ear: Tympanic membrane, ear canal and external ear normal.     Nose: Nose normal. No congestion or rhinorrhea.     Mouth/Throat:     Lips: Pink.     Mouth: Mucous membranes are moist.     Pharynx: Oropharynx is clear. Uvula midline. No oropharyngeal exudate or posterior oropharyngeal erythema.  Eyes:     General: Lids are normal. Vision grossly intact. Gaze aligned appropriately. No scleral icterus.       Right eye: No discharge.        Left eye: No discharge.     Extraocular Movements: Extraocular movements intact.     Conjunctiva/sclera: Conjunctivae normal.     Pupils: Pupils are equal, round, and reactive to light.     Funduscopic exam:    Right eye: Red reflex present.        Left eye: Red reflex present. Neck:     Thyroid: No thyromegaly.     Vascular: No JVD.     Trachea: No tracheal deviation.  Cardiovascular:     Rate and Rhythm: Normal rate and regular rhythm.     Pulses: Normal pulses.     Heart sounds: Normal heart sounds, S1 normal and S2 normal. No murmur heard.   No friction rub. No gallop.  Pulmonary:     Effort: Pulmonary effort is normal. No accessory muscle usage or respiratory distress.     Breath sounds: Normal breath sounds and air entry. No stridor. No wheezing or rales.  Chest:     Chest wall: No mass, lacerations, deformity, swelling, tenderness, crepitus or edema. There is no dullness to percussion.  Breasts:    Right: Normal. No swelling, bleeding, inverted nipple, mass, nipple discharge, skin change or tenderness.     Left: Normal. No swelling, bleeding, inverted nipple, mass, nipple discharge, skin change or tenderness.     Comments: Fibrocystic changes noted, soreness but no adenopathy in the left axilla.  Abdominal:  General: Bowel sounds are normal. There is no distension.     Palpations: Abdomen is soft. There is no shifting dullness, fluid wave, mass or pulsatile mass.      Tenderness: There is no abdominal tenderness. There is no guarding or rebound.  Musculoskeletal:        General: No tenderness or deformity. Normal range of motion.     Cervical back: Normal range of motion and neck supple.     Right lower leg: No edema.     Left lower leg: No edema.  Lymphadenopathy:     Cervical: No cervical adenopathy.     Upper Body:     Right upper body: No supraclavicular, axillary or pectoral adenopathy.     Left upper body: No supraclavicular, axillary or pectoral adenopathy.  Skin:    General: Skin is warm and dry.     Capillary Refill: Capillary refill takes less than 2 seconds.     Coloration: Skin is not pale.     Findings: No erythema or rash.  Neurological:     Mental Status: She is alert and oriented to person, place, and time.     Cranial Nerves: No cranial nerve deficit.     Motor: No abnormal muscle tone.     Coordination: Coordination normal.     Deep Tendon Reflexes: Reflexes are normal and symmetric.  Psychiatric:        Mood and Affect: Mood and affect normal.        Behavior: Behavior normal. Behavior is cooperative.        Thought Content: Thought content normal.        Judgment: Judgment normal.     Assessment/Plan: 1. Encounter for routine adult health examination with abnormal findings Age-appropriate preventive screenings and vaccinations discussed, annual physical exam completed. Routine labs for health maintenance ordered, see below. PHM updated. Mammogram and colonoscopy due, patienth as family history of colon and breast cancer  2. B12 deficiency Repeat lab.  - B12 and Folate Panel  3. Thiamine deficiency History of low vitamin B1, last level was high, repeat level - Vitamin B1  4. Vitamin D deficiency Repeat vitamin D level - Vitamin D (25 hydroxy)  5. Mixed hyperlipidemia Repeat lipid panel  - Lipid Profile  6. Other elevated white blood cell (WBC) count Lab ordered - CBC with Differential/Platelet  7. Migraine  without aura and without status migrainosus, not intractable Takes nurtec and topiramate - topiramate (TOPAMAX) 25 MG tablet; TAKE 2 TABLETS BY MOUTH EVERY NIGHT AT BEDTIME FOR MIGRAINE PREVENTION  Dispense: 60 tablet; Refill: 2 - Rimegepant Sulfate (NURTEC) 75 MG TBDP; Take 1 tablet by mouth daily as needed (Maximum 1 tablet in 24 hours).  Dispense: 8 tablet; Refill: 5  8. Hypokalemia Labs ordered - CMP14+EGFR - TSH + free T4  9. Generalized anxiety disorder Stable, refills ordered - busPIRone (BUSPAR) 30 MG tablet; TAKE 1 TABLET(30 MG) BY MOUTH TWICE DAILY AS NEEDED  Dispense: 60 tablet; Refill: 2 - hydrOXYzine (VISTARIL) 50 MG capsule; Take one tab po qhs prn  Dispense: 60 capsule; Refill: 2  10. Chronic bilateral low back pain with bilateral sciatica Refills ordered - cyclobenzaprine (FLEXERIL) 10 MG tablet; Take 1 tablet by mouth 3 times a day  Dispense: 90 tablet; Refill: 2 - gabapentin (NEURONTIN) 600 MG tablet; Take 1 tablet (600 mg total) by mouth 3 (three) times daily.  Dispense: 180 tablet; Refill: 1 - meloxicam (MOBIC) 15 MG tablet; Take 1 tab po daily  Dispense:  30 tablet; Refill: 3  11. Vaginal candidiasis Diflucan reordered so patient can have on hand just in case.  - fluconazole (DIFLUCAN) 150 MG tablet; Take one tablet once, may repeat dosing in 3 days for recurring symptoms up to 2 doses.  Dispense: 3 tablet; Refill: 0  12. Primary insomnia Prescription changed to reflect current use, discussed with patient.  - traZODone (DESYREL) 100 MG tablet; Take 2 tablets (200 mg total) by mouth at bedtime.  Dispense: 60 tablet; Refill: 2  13. Moderate episode of recurrent major depressive disorder (HCC) Continue duloxetine as prescribed - DULoxetine (CYMBALTA) 60 MG capsule; Take 1 capsule (60 mg total) by mouth daily.  Dispense: 90 capsule; Refill: 1  14. Encounter for screening mammogram for malignant neoplasm of breast Screening mammogram ordered.  - MM DIGITAL SCREENING  BILATERAL; Future  15. Screening for colorectal cancer Refer to GI in [place], patient has been to this provider before - Ambulatory referral to Gastroenterology  16. Dysuria Routine urinalysis done - UA/M w/rflx Culture, Routine  17. Class 2 severe obesity due to excess calories with serious comorbidity and body mass index (BMI) of 36.0 to 36.9 in adult [facility]) Phentermine prescribed. Follow up in 4 weeks Obesity Counseling: Risk Assessment: An assessment of behavioral risk factors was made today and includes lack of exercise sedentary lifestyle, lack of portion control and poor dietary habits.  Risk Modification Advice: She was counseled on portion control guidelines. Discussed decreasing high carb and high sugar foods in diet, discussed increasing lean protein. The detrimental long term effects of obesity on her health and ongoing poor compliance was also discussed with the patient.   - phentermine 37.5 MG capsule; Take 1 capsule (37.5 mg total) by mouth every morning.  Dispense: 30 capsule; Refill: 0      General Counseling: Channie verbalizes understanding of the findings of todays visit and agrees with plan of treatment. I have discussed any further diagnostic evaluation that may be needed or ordered today. We also reviewed her medications today. she has been encouraged to call the office with any questions or concerns that should arise related to todays visit.    Orders Placed This Encounter  Procedures   MM DIGITAL SCREENING BILATERAL   UA/M w/rflx Culture, Routine   Vitamin B1   B12 and Folate Panel   CBC with Differential/Platelet   CMP14+EGFR   Lipid Profile   Vitamin D (25 hydroxy)   TSH + free T4   Ambulatory referral to Gastroenterology    Meds ordered this encounter  Medications   busPIRone (BUSPAR) 30 MG tablet    Sig: TAKE 1 TABLET(30 MG) BY MOUTH TWICE DAILY AS NEEDED    Dispense:  60 tablet    Refill:  2   cyclobenzaprine (FLEXERIL) 10 MG tablet     Sig: Take 1 tablet by mouth 3 times a day    Dispense:  90 tablet    Refill:  2   fluconazole (DIFLUCAN) 150 MG tablet    Sig: Take one tablet once, may repeat dosing in 3 days for recurring symptoms up to 2 doses.    Dispense:  3 tablet    Refill:  0   gabapentin (NEURONTIN) 600 MG tablet    Sig: Take 1 tablet (600 mg total) by mouth 3 (three) times daily.    Dispense:  180 tablet    Refill:  1   hydrOXYzine (VISTARIL) 50 MG capsule    Sig: Take one tab po qhs prn  Dispense:  60 capsule    Refill:  2   meloxicam (MOBIC) 15 MG tablet    Sig: Take 1 tab po daily    Dispense:  30 tablet    Refill:  3   topiramate (TOPAMAX) 25 MG tablet    Sig: TAKE 2 TABLETS BY MOUTH EVERY NIGHT AT BEDTIME FOR MIGRAINE PREVENTION    Dispense:  60 tablet    Refill:  2   Rimegepant Sulfate (NURTEC) 75 MG TBDP    Sig: Take 1 tablet by mouth daily as needed (Maximum 1 tablet in 24 hours).    Dispense:  8 tablet    Refill:  5   traZODone (DESYREL) 100 MG tablet    Sig: Take 2 tablets (200 mg total) by mouth at bedtime.    Dispense:  60 tablet    Refill:  2   DULoxetine (CYMBALTA) 60 MG capsule    Sig: Take 1 capsule (60 mg total) by mouth daily.    Dispense:  90 capsule    Refill:  1    Update prescription as patient taking only once daily.   hpv 9-valent vaccine (GARDASIL 9) SUSP injection    Sig: Inject 0.5 mLs into the muscle once for 1 dose.    Dispense:  0.5 mL    Refill:  0   phentermine 37.5 MG capsule    Sig: Take 1 capsule (37.5 mg total) by mouth every morning.    Dispense:  30 capsule    Refill:  0    Return in about 4 weeks (around 09/21/2021) for F/U, Weight loss, Flora Parks PCP.   Total time spent:30 Minutes Time spent includes review of chart, medications, test results, and follow up plan with the patient.   [place] Controlled Substance Database was reviewed by me.  This patient was seen by Jonetta Osgood, FNP-C in collaboration with Dr. Clayborn Bigness as a part of collaborative  care agreement.  Tate Jerkins R. Valetta Fuller, MSN, FNP-C Internal medicine

## 2021-08-25 LAB — UA/M W/RFLX CULTURE, ROUTINE
Bilirubin, UA: NEGATIVE
Glucose, UA: NEGATIVE
Ketones, UA: NEGATIVE
Leukocytes,UA: NEGATIVE
Nitrite, UA: NEGATIVE
Protein,UA: NEGATIVE
RBC, UA: NEGATIVE
Specific Gravity, UA: 1.006 (ref 1.005–1.030)
Urobilinogen, Ur: 0.2 mg/dL (ref 0.2–1.0)
pH, UA: 6.5 (ref 5.0–7.5)

## 2021-08-25 LAB — MICROSCOPIC EXAMINATION
Bacteria, UA: NONE SEEN
Casts: NONE SEEN /lpf
Epithelial Cells (non renal): NONE SEEN /hpf (ref 0–10)
RBC, Urine: NONE SEEN /hpf (ref 0–2)
WBC, UA: NONE SEEN /hpf (ref 0–5)

## 2021-08-26 ENCOUNTER — Other Ambulatory Visit: Payer: Self-pay | Admitting: Internal Medicine

## 2021-08-26 DIAGNOSIS — M5442 Lumbago with sciatica, left side: Secondary | ICD-10-CM

## 2021-08-26 DIAGNOSIS — F331 Major depressive disorder, recurrent, moderate: Secondary | ICD-10-CM

## 2021-08-26 DIAGNOSIS — G8929 Other chronic pain: Secondary | ICD-10-CM

## 2021-08-27 ENCOUNTER — Other Ambulatory Visit: Payer: Self-pay | Admitting: Nurse Practitioner

## 2021-08-27 ENCOUNTER — Encounter (INDEPENDENT_AMBULATORY_CARE_PROVIDER_SITE_OTHER): Payer: Self-pay | Admitting: *Deleted

## 2021-08-27 ENCOUNTER — Telehealth: Payer: Self-pay

## 2021-08-27 DIAGNOSIS — F331 Major depressive disorder, recurrent, moderate: Secondary | ICD-10-CM

## 2021-08-27 MED ORDER — PAROXETINE HCL 40 MG PO TABS: 40.0000 mg | ORAL_TABLET | Freq: Every day | ORAL | 3 refills | Status: DC

## 2021-08-28 LAB — CMP14+EGFR
ALT: 19 IU/L (ref 0–32)
AST: 21 IU/L (ref 0–40)
Albumin/Globulin Ratio: 2.2 (ref 1.2–2.2)
Albumin: 4.3 g/dL (ref 3.8–4.8)
Alkaline Phosphatase: 97 IU/L (ref 44–121)
BUN/Creatinine Ratio: 11 (ref 9–23)
BUN: 11 mg/dL (ref 6–20)
Bilirubin Total: 0.2 mg/dL (ref 0.0–1.2)
CO2: 25 mmol/L (ref 20–29)
Calcium: 8.9 mg/dL (ref 8.7–10.2)
Chloride: 100 mmol/L (ref 96–106)
Creatinine, Ser: 0.98 mg/dL (ref 0.57–1.00)
Globulin, Total: 2 g/dL (ref 1.5–4.5)
Glucose: 82 mg/dL (ref 65–99)
Potassium: 4.2 mmol/L (ref 3.5–5.2)
Sodium: 137 mmol/L (ref 134–144)
Total Protein: 6.3 g/dL (ref 6.0–8.5)
eGFR: 75 mL/min/{1.73_m2} (ref >59)

## 2021-08-28 LAB — CBC WITH DIFFERENTIAL/PLATELET
Basophils Absolute: 0 10*3/uL (ref 0.0–0.2)
Basos: 0 %
EOS (ABSOLUTE): 0.1 10*3/uL (ref 0.0–0.4)
Eos: 1 %
Hematocrit: 40.6 % (ref 34.0–46.6)
Hemoglobin: 13.9 g/dL (ref 11.1–15.9)
Immature Grans (Abs): 0 10*3/uL (ref 0.0–0.1)
Immature Granulocytes: 0 %
Lymphocytes Absolute: 1.4 10*3/uL (ref 0.7–3.1)
Lymphs: 20 %
MCH: 30.3 pg (ref 26.6–33.0)
MCHC: 34.2 g/dL (ref 31.5–35.7)
MCV: 89 fL (ref 79–97)
Monocytes Absolute: 0.5 10*3/uL (ref 0.1–0.9)
Monocytes: 7 %
Neutrophils Absolute: 4.9 10*3/uL (ref 1.4–7.0)
Neutrophils: 72 %
Platelets: 238 10*3/uL (ref 150–450)
RBC: 4.59 x10E6/uL (ref 3.77–5.28)
RDW: 12.5 % (ref 11.7–15.4)
WBC: 6.9 10*3/uL (ref 3.4–10.8)

## 2021-08-28 LAB — TSH+FREE T4
Free T4: 0.84 ng/dL (ref 0.82–1.77)
TSH: 1.18 u[IU]/mL (ref 0.450–4.500)

## 2021-08-28 LAB — VITAMIN D 25 HYDROXY (VIT D DEFICIENCY, FRACTURES): Vit D, 25-Hydroxy: 24.4 ng/mL — ABNORMAL LOW (ref 30.0–100.0)

## 2021-08-28 LAB — LIPID PANEL
Chol/HDL Ratio: 3.7 ratio (ref 0.0–4.4)
Cholesterol, Total: 217 mg/dL — ABNORMAL HIGH (ref 100–199)
HDL: 58 mg/dL (ref >39)
LDL Chol Calc (NIH): 124 mg/dL — ABNORMAL HIGH (ref 0–99)
Triglycerides: 202 mg/dL — ABNORMAL HIGH (ref 0–149)
VLDL Cholesterol Cal: 35 mg/dL (ref 5–40)

## 2021-08-28 LAB — B12 AND FOLATE PANEL
Folate: 17.3 ng/mL (ref >3.0)
Vitamin B-12: 434 pg/mL (ref 232–1245)

## 2021-08-28 LAB — VITAMIN B1: Thiamine: 169.1 nmol/L (ref 66.5–200.0)

## 2021-08-28 NOTE — Telephone Encounter (Signed)
LMOM that med was sent in and Alyssa resent the HPV vaccine last Friday

## 2021-08-29 NOTE — Progress Notes (Signed)
Please call the patient and let her know. I have reviewed the lab results, they are grossly normal. There are no abnormal values that require immediate intervention but there are some recommendations for diet and lifestyle modifications. Vitamin D level was slightly low and an OTC supplement of 2000-5000 units of vitamin D daily would be an appropriate intervention. Cholesterol levels are significantly abnormal. She is not a candidate for statin therapy at this time. Recommendation is a daily fish oil supplement of 1000 mg daily and diet and lifestyle modifications including physical activity and decreasing foods in diet that are high in saturated or trans fats. Polyunsaturated and monounsaturated fats are good. Recommended amount of physical activity is 30 minutes per day 5 days a week or 150 minutes per week total of moderate intensity activity.

## 2021-09-03 ENCOUNTER — Telehealth: Payer: Self-pay

## 2021-09-03 NOTE — Telephone Encounter (Signed)
LMOM to talk about her lab results

## 2021-09-03 NOTE — Telephone Encounter (Signed)
-----   Message from Sallyanne Kuster, NP sent at 08/29/2021  6:22 AM EDT ----- Please call the patient and let her know. I have reviewed the lab results, they are grossly normal. There are no abnormal values that require immediate intervention but there are some recommendations for diet and lifestyle modifications. Vitamin D level was slightly low and an OTC supplement of 2000-5000 units of vitamin D daily would be an appropriate intervention. Cholesterol levels are significantly abnormal. She is not a candidate for statin therapy at this time. Recommendation is a daily fish oil supplement of 1000 mg daily and diet and lifestyle modifications including physical activity and decreasing foods in diet that are high in saturated or trans fats. Polyunsaturated and monounsaturated fats are good. Recommended amount of physical activity is 30 minutes per day 5 days a week or 150 minutes per week total of moderate intensity activity.

## 2021-09-05 ENCOUNTER — Other Ambulatory Visit: Payer: Self-pay | Admitting: Nurse Practitioner

## 2021-09-05 ENCOUNTER — Telehealth: Payer: Self-pay

## 2021-09-05 DIAGNOSIS — F331 Major depressive disorder, recurrent, moderate: Secondary | ICD-10-CM

## 2021-09-05 NOTE — Telephone Encounter (Signed)
Spoke to pt about her lab results and gave her all information from alyssa about her life style changes that need to be done due to her lab abnormalities.  Pt understood and also I sent her refill on her Cymbalta 60 mg.  Pt asked about the HPV vaccine and I advised that if she is having issues getting the vaccine at her pharmacy, that she can check at the [facility]

## 2021-09-21 ENCOUNTER — Ambulatory Visit: Payer: Medicaid Other | Admitting: Nurse Practitioner

## 2021-09-24 ENCOUNTER — Encounter: Payer: Self-pay | Admitting: Nurse Practitioner

## 2021-09-24 ENCOUNTER — Other Ambulatory Visit: Payer: Self-pay

## 2021-09-24 ENCOUNTER — Ambulatory Visit: Payer: Medicaid Other | Admitting: Nurse Practitioner

## 2021-09-24 VITALS — BP 122/80 | HR 102 | Temp 98.3°F | Resp 16 | Ht 67.0 in | Wt 236.0 lb

## 2021-09-24 DIAGNOSIS — Z6836 Body mass index (BMI) 36.0-36.9, adult: Secondary | ICD-10-CM

## 2021-09-24 DIAGNOSIS — K5909 Other constipation: Secondary | ICD-10-CM

## 2021-09-24 DIAGNOSIS — Z91199 Patient's noncompliance with other medical treatment and regimen due to unspecified reason: Secondary | ICD-10-CM

## 2021-09-24 MED ORDER — PHENTERMINE HCL 37.5 MG PO CAPS: 37.5000 mg | ORAL_CAPSULE | ORAL | 0 refills | Status: DC

## 2021-09-24 NOTE — Progress Notes (Signed)
[facility] [address] [place], [place] [address]  Internal MEDICINE  Office Visit Note  Patient Name: Andrea [name]  [id number]  [id number]  Date of Service: 10/13/2021  Chief Complaint  Patient presents with   Weight Loss    4 week f/u    HPI [name] presents for a follow up visit for weight loss management and constipation. [name] reportsbeing very forgetful. She states that she forgets to take the miralax to help with her constipation. She was also started on phentermine to help her lose weight but she keeps forgetting to take the phentermine as well. She reports that she has been dealing with increased stress and she states that she quit smoking earlier this year. Her kids have been sick recently which increases her stress too.      Current Medication: Outpatient Encounter Medications as of 09/24/2021  Medication Sig   albuterol (VENTOLIN HFA) 108 (90 Base) MCG/ACT inhaler Inhale 2 puffs into the lungs every 6 (six) hours as needed for wheezing or shortness of breath.   busPIRone (BUSPAR) 30 MG tablet TAKE 1 TABLET(30 MG) BY MOUTH TWICE DAILY AS NEEDED   cyclobenzaprine (FLEXERIL) 10 MG tablet Take 1 tablet by mouth 3 times a day   DULoxetine (CYMBALTA) 60 MG capsule Take 1 capsule (60 mg total) by mouth daily.   Erenumab-aooe (AIMOVIG) 140 MG/ML SOAJ Inject 140 mg into the skin every 28 (twenty-eight) days.   fluconazole (DIFLUCAN) 150 MG tablet Take one tablet once, may repeat dosing in 3 days for recurring symptoms up to 2 doses.   gabapentin (NEURONTIN) 600 MG tablet Take 1 tablet (600 mg total) by mouth 3 (three) times daily.   hydrOXYzine (VISTARIL) 50 MG capsule Take one tab po qhs prn   meloxicam (MOBIC) 15 MG tablet Take 1 tab po daily   ondansetron (ZOFRAN ODT) 8 MG disintegrating tablet Take 1 tablet (8 mg total) by mouth every 8 (eight) hours as needed for nausea or vomiting.   PARoxetine (PAXIL) 40 MG tablet Take 1 tablet (40 mg total) by mouth at  bedtime.   Rimegepant Sulfate (NURTEC) 75 MG TBDP Take 1 tablet by mouth daily as needed (Maximum 1 tablet in 24 hours).   topiramate (TOPAMAX) 25 MG tablet TAKE 2 TABLETS BY MOUTH EVERY NIGHT AT BEDTIME FOR MIGRAINE PREVENTION   traZODone (DESYREL) 100 MG tablet Take 2 tablets (200 mg total) by mouth at bedtime.   [DISCONTINUED] phentermine 37.5 MG capsule Take 1 capsule (37.5 mg total) by mouth every morning.   phentermine 37.5 MG capsule Take 1 capsule (37.5 mg total) by mouth every morning.   No facility-administered encounter medications on file as of 09/24/2021.    Surgical History: Past Surgical History:  Procedure Laterality Date   CESAREAN SECTION N/A 04/24/2016   Procedure: CESAREAN SECTION;  Surgeon: Brock Bad, MD;  Location: [facility] BIRTHING SUITES;  Service: Obstetrics;  Laterality: N/A;   CHOLECYSTECTOMY     TONSILLECTOMY     TONSILLECTOMY AND ADENOIDECTOMY      Medical History: Past Medical History:  Diagnosis Date   Abnormal finding on MRI of brain 01/02/2017   Anxiety and depression    see progress notes -sw   Common migraine with intractable migraine 01/02/2017   Polycystic ovarian disease    Psoriasis    Rosacea    Seizure (HCC)    as child from trauma; no meds and no further seizures    Family History: Family History  Problem Relation Age of  Onset   Cancer Mother    Breast cancer Mother        early 73's   Bone cancer Mother    Migraines Mother    Brain cancer Mother    Cancer Father    Colon cancer Father    Prostate cancer Father    Liver cancer Father    Migraines Sister    Brain cancer Maternal Grandmother    Migraines Maternal Grandmother    Breast cancer Maternal Grandmother        <50   Breast cancer Maternal Great-grandmother    Brain cancer Maternal Great-grandmother     Social History   Socioeconomic History   Marital status: Divorced    Spouse name: Not on file   Number of children: 2   Years of education: GED   Highest  education level: Not on file  Occupational History   Occupation: N/A  Tobacco Use   Smoking status: Former    Types: Cigarettes, E-cigarettes    Start date: 12/2020    Quit date: 12/2020    Years since quitting: 0.8   Smokeless tobacco: Never   Tobacco comments:    Pt stopped cigs 12/2020  Vaping Use   Vaping Use: Every day  Substance and Sexual Activity   Alcohol use: No   Drug use: No   Sexual activity: Yes    Birth control/protection: None  Other Topics Concern   Not on file  Social History Narrative   Lives at home w/ her children   Right-handed   Caffeine: 1 cup coffee per day   One story home   Social Determinants of Health   Financial Resource Strain: Not on file  Food Insecurity: Not on file  Transportation Needs: Not on file  Physical Activity: Not on file  Stress: Not on file  Social Connections: Not on file  Intimate Partner Violence: Not on file      Review of Systems  Constitutional:  Negative for chills, fatigue and unexpected weight change.  HENT:  Negative for congestion, rhinorrhea, sneezing and sore throat.   Eyes:  Negative for redness.  Respiratory:  Negative for cough, chest tightness and shortness of breath.   Cardiovascular:  Negative for chest pain and palpitations.  Gastrointestinal:  Negative for abdominal pain, constipation, diarrhea, nausea and vomiting.  Genitourinary:  Negative for dysuria and frequency.  Musculoskeletal:  Negative for arthralgias, back pain, joint swelling and neck pain.  Skin:  Negative for rash.  Neurological: Negative.  Negative for tremors and numbness.  Hematological:  Negative for adenopathy. Does not bruise/bleed easily.  Psychiatric/Behavioral:  Negative for behavioral problems (Depression), sleep disturbance and suicidal ideas. The patient is not nervous/anxious.    Vital Signs: BP 122/80   Pulse (!) 102   Temp 98.3 F (36.8 C)   Resp 16   Ht 5\' 7"  (1.702 m)   Wt 236 lb (107 kg)   SpO2 98%   BMI  36.96 kg/m    Physical Exam Vitals reviewed.  Constitutional:      Appearance: Normal appearance.  HENT:     Head: Normocephalic and atraumatic.  Eyes:     Pupils: Pupils are equal, round, and reactive to light.  Cardiovascular:     Rate and Rhythm: Normal rate and regular rhythm.  Pulmonary:     Effort: Pulmonary effort is normal. No respiratory distress.  Neurological:     Mental Status: She is alert and oriented to person, place, and time.  Cranial Nerves: No cranial nerve deficit.     Coordination: Coordination normal.     Gait: Gait normal.  Psychiatric:        Mood and Affect: Mood normal.        Behavior: Behavior normal.       Assessment/Plan: 1. Other constipation Encouraged patient to take miralex once daily for constipation as previously discussed.   2. Class 2 severe obesity due to excess calories with serious comorbidity and body mass index (BMI) of 36.0 to 36.9 in adult [facility]) Will try 1 more month of phentermine, if she does not lose any weight, will discuss other options and do a metabolic test if not already done.  - phentermine 37.5 MG capsule; Take 1 capsule (37.5 mg total) by mouth every morning.  Dispense: 30 capsule; Refill: 0  3. Current non-adherence to medical treatment Encourage patient to set reminders and alarms on her smartphone and to set her miralax and other medications in a spot near something she uses on  daily basis such as the coffee maker   General Counseling: Kody verbalizes understanding of the findings of todays visit and agrees with plan of treatment. I have discussed any further diagnostic evaluation that may be needed or ordered today. We also reviewed her medications today. she has been encouraged to call the office with any questions or concerns that should arise related to todays visit.    No orders of the defined types were placed in this encounter.   Meds ordered this encounter  Medications   phentermine 37.5 MG  capsule    Sig: Take 1 capsule (37.5 mg total) by mouth every morning.    Dispense:  30 capsule    Refill:  0     Return in about 4 weeks (around 10/22/2021) for F/U, Weight loss, Rosslyn Pasion PCP.   Total time spent:20 Minutes Time spent includes review of chart, medications, test results, and follow up plan with the patient.   [place] Controlled Substance Database was reviewed by me.  This patient was seen by Sallyanne Kuster, FNP-C in collaboration with Dr. Beverely Risen as a part of collaborative care agreement.   Fareeha Evon R. Tedd Sias, MSN, FNP-C Internal medicine

## 2021-10-23 ENCOUNTER — Other Ambulatory Visit: Payer: Self-pay

## 2021-10-23 ENCOUNTER — Ambulatory Visit: Payer: Medicaid Other | Admitting: Nurse Practitioner

## 2021-10-23 ENCOUNTER — Encounter: Payer: Self-pay | Admitting: Nurse Practitioner

## 2021-10-23 VITALS — BP 113/81 | HR 90 | Temp 98.4°F | Resp 16 | Ht 67.0 in | Wt 238.6 lb

## 2021-10-23 DIAGNOSIS — K5909 Other constipation: Secondary | ICD-10-CM | POA: Diagnosis not present

## 2021-10-23 DIAGNOSIS — N907 Vulvar cyst: Secondary | ICD-10-CM

## 2021-10-23 DIAGNOSIS — M25512 Pain in left shoulder: Secondary | ICD-10-CM

## 2021-10-23 DIAGNOSIS — Z9119 Patient's noncompliance with other medical treatment and regimen due to financial hardship: Secondary | ICD-10-CM | POA: Diagnosis not present

## 2021-10-23 DIAGNOSIS — G8929 Other chronic pain: Secondary | ICD-10-CM

## 2021-10-23 NOTE — Progress Notes (Signed)
[facility] [address] [place], [place] [address]  Internal MEDICINE  Office Visit Note  Patient Name: Andrea [name]  [id number]  [id number]  Date of Service: 10/23/2021  Chief Complaint  Patient presents with   Follow-up   Weight Loss   Anxiety   Depression    HPI Andrea [name] presents for a follow up visit for weight loss management. She has been taking phentermine and has gained 2 lbs depsite remembering to take the medication over the past month. She is having financial issues and is unable to afford basic needs.  Patient has been experiencing persistent constipation. She reports that she is constantly bloated and has early satiety and decreased appetite. Her last BM was 5 days ago. She cannot even afford miralax right now. She has a history of small bowel obstruction in the past.  She needs a GI referral but cannot afford the referral or the previously recommended endoscopy procedure that she states she will need. Her financial situation should improve in the new year. She reports having recurrent respiratory infections that her children bring home from school include an episode of the flu. She has an untreated boil/cyst or abscess that has been there for a few months and has been oozing blood and purulent drainage. It is located on the lip of her vagina. She states that she needs a referral to gynecology but cannot afford it right now.  Her left shoulder is also bothering her and she is worried that she has torn something in her left shoulder. She states that she will need a referral to orthopedic specialist but cannot afford the referral or imaging at this time.      Current Medication: Outpatient Encounter Medications as of 10/23/2021  Medication Sig   albuterol (VENTOLIN HFA) 108 (90 Base) MCG/ACT inhaler Inhale 2 puffs into the lungs every 6 (six) hours as needed for wheezing or shortness of breath.   busPIRone (BUSPAR) 30 MG tablet TAKE 1 TABLET(30 MG) BY MOUTH  TWICE DAILY AS NEEDED   cyclobenzaprine (FLEXERIL) 10 MG tablet Take 1 tablet by mouth 3 times a day   DULoxetine (CYMBALTA) 60 MG capsule Take 1 capsule (60 mg total) by mouth daily.   Erenumab-aooe (AIMOVIG) 140 MG/ML SOAJ Inject 140 mg into the skin every 28 (twenty-eight) days.   fluconazole (DIFLUCAN) 150 MG tablet Take one tablet once, may repeat dosing in 3 days for recurring symptoms up to 2 doses.   gabapentin (NEURONTIN) 600 MG tablet Take 1 tablet (600 mg total) by mouth 3 (three) times daily.   hydrOXYzine (VISTARIL) 50 MG capsule Take one tab po qhs prn   meloxicam (MOBIC) 15 MG tablet Take 1 tab po daily   ondansetron (ZOFRAN ODT) 8 MG disintegrating tablet Take 1 tablet (8 mg total) by mouth every 8 (eight) hours as needed for nausea or vomiting.   PARoxetine (PAXIL) 40 MG tablet Take 1 tablet (40 mg total) by mouth at bedtime.   phentermine 37.5 MG capsule Take 1 capsule (37.5 mg total) by mouth every morning.   Rimegepant Sulfate (NURTEC) 75 MG TBDP Take 1 tablet by mouth daily as needed (Maximum 1 tablet in 24 hours).   topiramate (TOPAMAX) 25 MG tablet TAKE 2 TABLETS BY MOUTH EVERY NIGHT AT BEDTIME FOR MIGRAINE PREVENTION   traZODone (DESYREL) 100 MG tablet Take 2 tablets (200 mg total) by mouth at bedtime.   No facility-administered encounter medications on file as of 10/23/2021.    Surgical History: Past Surgical History:  Procedure Laterality Date   CESAREAN SECTION N/A 04/24/2016   Procedure: CESAREAN SECTION;  Surgeon: Brock Bad, MD;  Location: [facility] BIRTHING SUITES;  Service: Obstetrics;  Laterality: N/A;   CHOLECYSTECTOMY     TONSILLECTOMY     TONSILLECTOMY AND ADENOIDECTOMY      Medical History: Past Medical History:  Diagnosis Date   Abnormal finding on MRI of brain 01/02/2017   Anxiety and depression    see progress notes -sw   Common migraine with intractable migraine 01/02/2017   Polycystic ovarian disease    Psoriasis    Rosacea    Seizure (HCC)     as child from trauma; no meds and no further seizures    Family History: Family History  Problem Relation Age of Onset   Cancer Mother    Breast cancer Mother        early 20's   Bone cancer Mother    Migraines Mother    Brain cancer Mother    Cancer Father    Colon cancer Father    Prostate cancer Father    Liver cancer Father    Migraines Sister    Brain cancer Maternal Grandmother    Migraines Maternal Grandmother    Breast cancer Maternal Grandmother        <50   Breast cancer Maternal Great-grandmother    Brain cancer Maternal Great-grandmother     Social History   Socioeconomic History   Marital status: Divorced    Spouse name: Not on file   Number of children: 2   Years of education: GED   Highest education level: Not on file  Occupational History   Occupation: N/A  Tobacco Use   Smoking status: Former    Types: Cigarettes, E-cigarettes    Start date: 12/2020    Quit date: 12/2020    Years since quitting: 0.8   Smokeless tobacco: Never   Tobacco comments:    Pt stopped cigs 12/2020  Vaping Use   Vaping Use: Every day  Substance and Sexual Activity   Alcohol use: No   Drug use: No   Sexual activity: Yes    Birth control/protection: None  Other Topics Concern   Not on file  Social History Narrative   Lives at home w/ her children   Right-handed   Caffeine: 1 cup coffee per day   One story home   Social Determinants of Health   Financial Resource Strain: Not on file  Food Insecurity: Not on file  Transportation Needs: Not on file  Physical Activity: Not on file  Stress: Not on file  Social Connections: Not on file  Intimate Partner Violence: Not on file      Review of Systems  Constitutional:  Negative for chills, fatigue and unexpected weight change.  HENT:  Negative for congestion, rhinorrhea, sneezing and sore throat.   Eyes:  Negative for redness.  Respiratory:  Negative for cough, chest tightness and shortness of breath.    Cardiovascular: Negative.  Negative for chest pain and palpitations.  Gastrointestinal:  Positive for abdominal distention, constipation and rectal pain. Negative for abdominal pain, diarrhea, nausea and vomiting.  Genitourinary:  Positive for vaginal pain (abscess/boil oozing and draining). Negative for dysuria and frequency.  Musculoskeletal:  Positive for arthralgias (left shoulder pain chronic). Negative for back pain, joint swelling and neck pain.  Skin:  Negative for rash.  Neurological:  Positive for headaches. Negative for dizziness, tremors and numbness.  Hematological:  Negative for adenopathy. Does not bruise/bleed  easily.  Psychiatric/Behavioral:  Positive for behavioral problems (Depression) and decreased concentration. Negative for self-injury, sleep disturbance and suicidal ideas. The patient is nervous/anxious.    Vital Signs: BP 113/81   Pulse 90   Temp 98.4 F (36.9 C)   Resp 16   Ht 5\' 7"  (1.702 m)   Wt 238 lb 9.6 oz (108.2 kg)   SpO2 99%   BMI 37.37 kg/m    Physical Exam Vitals reviewed.  Constitutional:      General: She is not in acute distress.    Appearance: Normal appearance. She is obese. She is not ill-appearing.  HENT:     Head: Normocephalic and atraumatic.  Eyes:     Pupils: Pupils are equal, round, and reactive to light.  Cardiovascular:     Rate and Rhythm: Normal rate and regular rhythm.  Pulmonary:     Effort: Pulmonary effort is normal. No respiratory distress.  Abdominal:     Tenderness: There is abdominal tenderness.  Neurological:     Mental Status: She is alert and oriented to person, place, and time.     Cranial Nerves: No cranial nerve deficit.     Coordination: Coordination normal.     Gait: Gait normal.  Psychiatric:        Mood and Affect: Mood normal.        Behavior: Behavior normal.       Assessment/Plan: 1. Other constipation Samples of miralax, align probiotic and linzess provided to patient to help alleviate  constipation. Patient is having financial difficulties. Need referral to gastroenterologist but cannot afford until January or February next year.   2. Chronic left shoulder pain Has had left shoulder pain the past few months, difficulty with dressing self, and decreased range of motion. Will need orthopedic surgery referral but cannot afford until January or February next year.   3. Epidermoid cyst of labia majora Patient wants a referral to gynecology but cannot afford until January or February next year.   4. Patient's noncompliance with other medical treatment and regimen due to financial hardship Patient having financial hardship. Samples provided as noted in problem #1 due to financial issues. Will follow up in 3 months with patient to discuss ordering referrals for gynecology, orthopedic and GI and will also discuss resuming weight loss management. Will discuss metabolic test at next office visit.    General Counseling: jamariya davidoff understanding of the findings of todays visit and agrees with plan of treatment. I have discussed any further diagnostic evaluation that may be needed or ordered today. We also reviewed her medications today. she has been encouraged to call the office with any questions or concerns that should arise related to todays visit.    No orders of the defined types were placed in this encounter.   No orders of the defined types were placed in this encounter.   Return in about 3 months (around 01/23/2022) for F/U, Deona Novitski PCP.   Total time spent:30 Minutes Time spent includes review of chart, medications, test results, and follow up plan with the patient.   [place] Controlled Substance Database was reviewed by me.  This patient was seen by 01/25/2022, FNP-C in collaboration with Dr. Sallyanne Kuster as a part of collaborative care agreement.   Tyreque Finken R. Beverely Risen, MSN, FNP-C Internal medicine

## 2021-10-29 ENCOUNTER — Other Ambulatory Visit: Payer: Self-pay | Admitting: Internal Medicine

## 2021-10-29 ENCOUNTER — Telehealth: Payer: Self-pay

## 2021-10-29 DIAGNOSIS — G43009 Migraine without aura, not intractable, without status migrainosus: Secondary | ICD-10-CM

## 2021-10-29 NOTE — Telephone Encounter (Signed)
Patient was referred to GI for colonoscopy. She is wanting to see them for difficulty swallowing, heartburn and constipation. They told her she will need new referral with these diagnoses. She was last seen 10/23/21.

## 2021-11-05 ENCOUNTER — Other Ambulatory Visit: Payer: Self-pay | Admitting: Nurse Practitioner

## 2021-11-05 ENCOUNTER — Encounter (INDEPENDENT_AMBULATORY_CARE_PROVIDER_SITE_OTHER): Payer: Self-pay | Admitting: *Deleted

## 2021-11-05 DIAGNOSIS — R1310 Dysphagia, unspecified: Secondary | ICD-10-CM

## 2021-11-05 DIAGNOSIS — K219 Gastro-esophageal reflux disease without esophagitis: Secondary | ICD-10-CM

## 2021-11-05 DIAGNOSIS — Z800 Family history of malignant neoplasm of digestive organs: Secondary | ICD-10-CM

## 2021-11-05 DIAGNOSIS — K5909 Other constipation: Secondary | ICD-10-CM

## 2021-11-05 NOTE — Telephone Encounter (Signed)
Updated GI referral manually faxed to Dr. Karilyn Cota [phone]. Sent mychart message to let patient know-Toni

## 2021-11-08 ENCOUNTER — Other Ambulatory Visit: Payer: Self-pay | Admitting: Nurse Practitioner

## 2021-11-08 DIAGNOSIS — F5101 Primary insomnia: Secondary | ICD-10-CM

## 2021-11-21 ENCOUNTER — Telehealth: Payer: Self-pay

## 2021-11-21 NOTE — Telephone Encounter (Signed)
New message   [facility] is unable to respond with clinical questions. Please see more information at the bottom of the page for next steps.  [employer] (Key: [id number]) Aimovig 140MG /ML auto-injectors   Form [employer] [facility] [employer] Form [phone] NCPDP) Created 4 days ago Sent to Plan 1 minute ago Plan Response less than a minute ago Submit Clinical Questions Determination Message from Plan Available without authorization.

## 2021-11-28 ENCOUNTER — Telehealth: Payer: Self-pay

## 2021-11-28 NOTE — Telephone Encounter (Signed)
GI appointment> 02/25/22 @ 9:45-Andrea [name]

## 2021-11-28 NOTE — Telephone Encounter (Signed)
Left vm for Dr. Patty Sermons office to return my call for update on referral-Toni

## 2021-12-04 ENCOUNTER — Other Ambulatory Visit: Payer: Self-pay | Admitting: Nurse Practitioner

## 2021-12-04 DIAGNOSIS — G43009 Migraine without aura, not intractable, without status migrainosus: Secondary | ICD-10-CM

## 2021-12-07 ENCOUNTER — Other Ambulatory Visit: Payer: Self-pay | Admitting: Nurse Practitioner

## 2021-12-07 DIAGNOSIS — N644 Mastodynia: Secondary | ICD-10-CM

## 2021-12-31 ENCOUNTER — Other Ambulatory Visit: Payer: Self-pay | Admitting: Nurse Practitioner

## 2021-12-31 ENCOUNTER — Other Ambulatory Visit: Payer: Self-pay | Admitting: Internal Medicine

## 2021-12-31 DIAGNOSIS — G43009 Migraine without aura, not intractable, without status migrainosus: Secondary | ICD-10-CM

## 2021-12-31 DIAGNOSIS — F331 Major depressive disorder, recurrent, moderate: Secondary | ICD-10-CM

## 2022-01-23 ENCOUNTER — Ambulatory Visit
Admission: RE | Admit: 2022-01-23 | Discharge: 2022-01-23 | Disposition: A | Discharge status: Home / Self Care | Payer: Medicaid Other | Source: Ambulatory Visit | Attending: Nurse Practitioner | Admitting: Nurse Practitioner

## 2022-01-23 ENCOUNTER — Encounter: Payer: Self-pay | Admitting: Nurse Practitioner

## 2022-01-23 ENCOUNTER — Other Ambulatory Visit: Payer: Self-pay

## 2022-01-23 ENCOUNTER — Ambulatory Visit
Admission: RE | Admit: 2022-01-23 | Discharge: 2022-01-23 | Disposition: A | Discharge status: Home / Self Care | Payer: Medicaid Other | Attending: Nurse Practitioner | Admitting: Nurse Practitioner

## 2022-01-23 ENCOUNTER — Ambulatory Visit: Payer: Medicaid Other | Admitting: Nurse Practitioner

## 2022-01-23 VITALS — BP 140/80 | HR 90 | Temp 98.0°F | Resp 16 | Ht 67.5 in | Wt 238.4 lb

## 2022-01-23 DIAGNOSIS — M25512 Pain in left shoulder: Secondary | ICD-10-CM

## 2022-01-23 DIAGNOSIS — M533 Sacrococcygeal disorders, not elsewhere classified: Secondary | ICD-10-CM | POA: Diagnosis not present

## 2022-01-23 DIAGNOSIS — K219 Gastro-esophageal reflux disease without esophagitis: Secondary | ICD-10-CM

## 2022-01-23 DIAGNOSIS — M5442 Lumbago with sciatica, left side: Secondary | ICD-10-CM

## 2022-01-23 DIAGNOSIS — F5101 Primary insomnia: Secondary | ICD-10-CM

## 2022-01-23 DIAGNOSIS — M25551 Pain in right hip: Secondary | ICD-10-CM | POA: Diagnosis not present

## 2022-01-23 DIAGNOSIS — K5909 Other constipation: Secondary | ICD-10-CM

## 2022-01-23 DIAGNOSIS — Z6836 Body mass index (BMI) 36.0-36.9, adult: Secondary | ICD-10-CM

## 2022-01-23 DIAGNOSIS — M5441 Lumbago with sciatica, right side: Secondary | ICD-10-CM

## 2022-01-23 DIAGNOSIS — F331 Major depressive disorder, recurrent, moderate: Secondary | ICD-10-CM

## 2022-01-23 DIAGNOSIS — F411 Generalized anxiety disorder: Secondary | ICD-10-CM

## 2022-01-23 DIAGNOSIS — G8929 Other chronic pain: Secondary | ICD-10-CM

## 2022-01-23 MED ORDER — BUSPIRONE HCL 30 MG PO TABS
ORAL_TABLET | ORAL | 1 refills | Status: DC
Start: 2022-01-23 — End: 2023-10-17

## 2022-01-23 MED ORDER — MELOXICAM 15 MG PO TABS: ORAL_TABLET | ORAL | 3 refills | Status: DC

## 2022-01-23 MED ORDER — DULOXETINE HCL 60 MG PO CPEP: 60.0000 mg | ORAL_CAPSULE | Freq: Every day | ORAL | 1 refills | Status: DC

## 2022-01-23 MED ORDER — GABAPENTIN 600 MG PO TABS: 600.0000 mg | ORAL_TABLET | Freq: Three times a day (TID) | ORAL | 1 refills | Status: DC

## 2022-01-23 MED ORDER — TRAZODONE HCL 100 MG PO TABS: 200.0000 mg | ORAL_TABLET | Freq: Every day | ORAL | 1 refills | Status: DC

## 2022-01-23 MED ORDER — CYCLOBENZAPRINE HCL 10 MG PO TABS: ORAL_TABLET | ORAL | 2 refills | Status: DC

## 2022-01-23 MED ORDER — METHYLPREDNISOLONE ACETATE 80 MG/ML IJ SUSP
80.0000 mg | Freq: Once | INTRAMUSCULAR | Status: AC
  Administered 2022-01-23: 60 mg via INTRAMUSCULAR

## 2022-01-23 MED ORDER — HYDROXYZINE PAMOATE 50 MG PO CAPS: 50.0000 mg | ORAL_CAPSULE | Freq: Two times a day (BID) | ORAL | 2 refills | Status: DC | PRN

## 2022-01-23 NOTE — Progress Notes (Unsigned)
[facility] [address] [place], [place] [address]  Internal MEDICINE  Office Visit Note  Patient Name: Andrea [name]  [id number]  [id number]  Date of Service: 01/23/2022  Chief Complaint  Patient presents with   Follow-up   Depression   Anxiety    Depression        Associated symptoms include no fatigue and no suicidal ideas.  Past medical history includes anxiety.   Anxiety Patient reports no chest pain, nausea, nervous/anxious behavior, palpitations, shortness of breath or suicidal ideas.    Andrea [name] presents for a follow-up visit for  GI next month Refills Fell right side tailbone and right hip painful xray 140/80    Current Medication: Outpatient Encounter Medications as of 01/23/2022  Medication Sig   AIMOVIG 140 MG/ML SOAJ ADMINISTER 1 ML UNDER THE SKIN EVERY 28 DAYS   albuterol (VENTOLIN HFA) 108 (90 Base) MCG/ACT inhaler Inhale 2 puffs into the lungs every 6 (six) hours as needed for wheezing or shortness of breath.   fluconazole (DIFLUCAN) 150 MG tablet Take one tablet once, may repeat dosing in 3 days for recurring symptoms up to 2 doses.   ondansetron (ZOFRAN ODT) 8 MG disintegrating tablet Take 1 tablet (8 mg total) by mouth every 8 (eight) hours as needed for nausea or vomiting.   PARoxetine (PAXIL) 40 MG tablet TAKE 1 TABLET(40 MG) BY MOUTH AT BEDTIME   phentermine 37.5 MG capsule Take 1 capsule (37.5 mg total) by mouth every morning.   Rimegepant Sulfate (NURTEC) 75 MG TBDP Take 1 tablet by mouth daily as needed (Maximum 1 tablet in 24 hours).   topiramate (TOPAMAX) 25 MG tablet TAKE 2 TABLETS BY MOUTH EVERY NIGHT AT BEDTIME FOR MIGRAINE PREVENTION   [DISCONTINUED] busPIRone (BUSPAR) 30 MG tablet TAKE 1 TABLET(30 MG) BY MOUTH TWICE DAILY AS NEEDED   [DISCONTINUED] cyclobenzaprine (FLEXERIL) 10 MG tablet Take 1 tablet by mouth 3 times a day   [DISCONTINUED] DULoxetine (CYMBALTA) 60 MG capsule Take 1 capsule (60 mg total) by mouth daily.    [DISCONTINUED] gabapentin (NEURONTIN) 600 MG tablet Take 1 tablet (600 mg total) by mouth 3 (three) times daily.   [DISCONTINUED] hydrOXYzine (VISTARIL) 50 MG capsule Take one tab po qhs prn   [DISCONTINUED] meloxicam (MOBIC) 15 MG tablet Take 1 tab po daily   [DISCONTINUED] traZODone (DESYREL) 100 MG tablet TAKE 2 TABLETS(200 MG) BY MOUTH AT BEDTIME   busPIRone (BUSPAR) 30 MG tablet TAKE 1 TABLET(30 MG) BY MOUTH TWICE DAILY AS NEEDED   cyclobenzaprine (FLEXERIL) 10 MG tablet Take 1 tablet by mouth 3 times a day   DULoxetine (CYMBALTA) 60 MG capsule Take 1 capsule (60 mg total) by mouth daily.   gabapentin (NEURONTIN) 600 MG tablet Take 1 tablet (600 mg total) by mouth 3 (three) times daily.   hydrOXYzine (VISTARIL) 50 MG capsule Take 1 capsule (50 mg total) by mouth 2 (two) times daily as needed for anxiety.   meloxicam (MOBIC) 15 MG tablet Take 1 tab po daily   traZODone (DESYREL) 100 MG tablet Take 2 tablets (200 mg total) by mouth at bedtime.   Facility-Administered Encounter Medications as of 01/23/2022  Medication   methylPREDNISolone acetate (DEPO-MEDROL) injection 80 mg    Surgical History: Past Surgical History:  Procedure Laterality Date   CESAREAN SECTION N/A 04/24/2016   Procedure: CESAREAN SECTION;  Surgeon: Brock Bad, MD;  Location: [facility] BIRTHING SUIT[facility];  Service: Obstetrics;  Laterality: N/A;   CHOLECYSTECTOMY     TONSILLECTOMY  TONSILLECTOMY AND ADENOIDECTOMY      Medical History: Past Medical History:  Diagnosis Date   Abnormal finding on MRI of brain 01/02/2017   Anxiety and depression    see progress notes -sw   Common migraine with intractable migraine 01/02/2017   Polycystic ovarian disease    Psoriasis    Rosacea    Seizure (HCC)    as child from trauma; no meds and no further seizures    Family History: Family History  Problem Relation Age of Onset   Cancer Mother    Breast cancer Mother        early 10's   Bone cancer Mother    Migraines  Mother    Brain cancer Mother    Cancer Father    Colon cancer Father    Prostate cancer Father    Liver cancer Father    Migraines Sister    Brain cancer Maternal Grandmother    Migraines Maternal Grandmother    Breast cancer Maternal Grandmother        <50   Breast cancer Maternal Great-grandmother    Brain cancer Maternal Great-grandmother     Social History   Socioeconomic History   Marital status: Divorced    Spouse name: Not on file   Number of children: 2   Years of education: GED   Highest education level: Not on file  Occupational History   Occupation: N/A  Tobacco Use   Smoking status: Former    Types: Cigarettes, E-cigarettes    Start date: 12/2020    Quit date: 12/2020    Years since quitting: 1.1   Smokeless tobacco: Never   Tobacco comments:    Pt stopped cigs 12/2020  Vaping Use   Vaping Use: Every day  Substance and Sexual Activity   Alcohol use: No   Drug use: No   Sexual activity: Yes    Birth control/protection: None  Other Topics Concern   Not on file  Social History Narrative   Lives at home w/ her children   Right-handed   Caffeine: 1 cup coffee per day   One story home   Social Determinants of Health   Financial Resource Strain: Not on file  Food Insecurity: Not on file  Transportation Needs: Not on file  Physical Activity: Not on file  Stress: Not on file  Social Connections: Not on file  Intimate Partner Violence: Not on file      Review of Systems  Constitutional:  Negative for chills, fatigue and unexpected weight change.  HENT:  Negative for congestion, rhinorrhea, sneezing and sore throat.   Eyes:  Negative for redness.  Respiratory:  Negative for cough, chest tightness and shortness of breath.   Cardiovascular:  Negative for chest pain and palpitations.  Gastrointestinal:  Negative for abdominal pain, constipation, diarrhea, nausea and vomiting.  Genitourinary:  Negative for dysuria and frequency.  Musculoskeletal:   Negative for arthralgias, back pain, joint swelling and neck pain.  Skin:  Negative for rash.  Neurological: Negative.  Negative for tremors and numbness.  Hematological:  Negative for adenopathy. Does not bruise/bleed easily.  Psychiatric/Behavioral:  Positive for depression. Negative for behavioral problems (Depression), sleep disturbance and suicidal ideas. The patient is not nervous/anxious.    Vital Signs: BP (!) 140/100    Pulse 90    Temp 98 F (36.7 C)    Resp 16    Ht 5' 7.5" (1.715 m)    Wt 238 lb 6.4 oz (108.1 kg)  SpO2 98%    BMI 36.79 kg/m    Physical Exam Vitals reviewed.  Constitutional:      General: She is not in acute distress.    Appearance: Normal appearance. She is obese. She is not ill-appearing.  HENT:     Head: Normocephalic and atraumatic.  Eyes:     Pupils: Pupils are equal, round, and reactive to light.  Cardiovascular:     Rate and Rhythm: Normal rate and regular rhythm.  Pulmonary:     Effort: Pulmonary effort is normal. No respiratory distress.  Abdominal:     Tenderness: There is abdominal tenderness.  Neurological:     Mental Status: She is alert and oriented to person, place, and time.     Cranial Nerves: No cranial nerve deficit.     Coordination: Coordination normal.     Gait: Gait normal.  Psychiatric:        Mood and Affect: Mood normal.        Behavior: Behavior normal.       Assessment/Plan:   General Counseling: Andrea [name] verbalizes understanding of the findings of todays visit and agrees with plan of treatment. I have discussed any further diagnostic evaluation that may be needed or ordered today. We also reviewed her medications today. she has been encouraged to call the office with any questions or concerns that should arise related to todays visit.    Orders Placed This Encounter  Procedures   DG Hip Unilat W OR W/O Pelvis 2-3 Views Right    Meds ordered this encounter  Medications   DULoxetine (CYMBALTA) 60 MG capsule     Sig: Take 1 capsule (60 mg total) by mouth daily.    Dispense:  90 capsule    Refill:  1   gabapentin (NEURONTIN) 600 MG tablet    Sig: Take 1 tablet (600 mg total) by mouth 3 (three) times daily.    Dispense:  180 tablet    Refill:  1   traZODone (DESYREL) 100 MG tablet    Sig: Take 2 tablets (200 mg total) by mouth at bedtime.    Dispense:  180 tablet    Refill:  1    ZERO refills remain on this prescription. Your patient is requesting advance approval of refills for this medication to PREVENT ANY MISSED DOSES   meloxicam (MOBIC) 15 MG tablet    Sig: Take 1 tab po daily    Dispense:  30 tablet    Refill:  3   hydrOXYzine (VISTARIL) 50 MG capsule    Sig: Take 1 capsule (50 mg total) by mouth 2 (two) times daily as needed for anxiety.    Dispense:  60 capsule    Refill:  2   cyclobenzaprine (FLEXERIL) 10 MG tablet    Sig: Take 1 tablet by mouth 3 times a day    Dispense:  90 tablet    Refill:  2   busPIRone (BUSPAR) 30 MG tablet    Sig: TAKE 1 TABLET(30 MG) BY MOUTH TWICE DAILY AS NEEDED    Dispense:  180 tablet    Refill:  1   methylPREDNISolone acetate (DEPO-MEDROL) injection 80 mg    Return in about 3 months (around 04/22/2022) for F/U, Andrea [name] PCP.   Total time spent:30 Minutes Time spent includes review of chart, medications, test results, and follow up plan with the patient.   [place] Controlled Substance Database was reviewed by me.  This patient was seen by Sallyanne Kuster, FNP-C in collaboration with Dr.  Beverely Risen as a part of collaborative care agreement.   Raad Clayson R. Tedd Sias, MSN, FNP-C Internal medicine

## 2022-01-25 ENCOUNTER — Telehealth: Payer: Self-pay

## 2022-01-25 NOTE — Telephone Encounter (Signed)
Pt notified for  res se is going to call back if she is not feeling better for orthopedic referral

## 2022-01-25 NOTE — Progress Notes (Signed)
Please call patient and let her know that her hip and pelvis x-ray are negative for any fracture or acute skeletal abnormality related to her fall.  If the pain is not improving or is worsening, I would be happy to put in an orthopedic referral if she wants me to.

## 2022-01-25 NOTE — Telephone Encounter (Signed)
-----   Message from Sallyanne Kuster, NP sent at 01/25/2022  1:42 PM EST ----- Please call patient and let her know that her hip and pelvis x-ray are negative for any fracture or acute skeletal abnormality related to her fall.  If the pain is not improving or is worsening, I would be happy to put in an orthopedic referral if she wants me to.

## 2022-02-04 ENCOUNTER — Telehealth: Payer: Self-pay

## 2022-02-04 NOTE — Telephone Encounter (Signed)
New message   Your information has been sent to [employer] [facility].  Andrea [name] (Key: [id number]) Aimovig 140MG /ML auto-injectors   Form [employer] Form [phone] NCPDP) Created 24 hours ago Sent to Plan 4 minutes ago Plan Response 4 minutes ago Submit Clinical Questions 1 minute ago Determination Wait for Determination Please wait for ([facility] to return a determination.

## 2022-02-25 ENCOUNTER — Ambulatory Visit (INDEPENDENT_AMBULATORY_CARE_PROVIDER_SITE_OTHER): Payer: Medicaid Other | Admitting: Gastroenterology

## 2022-02-25 ENCOUNTER — Encounter (INDEPENDENT_AMBULATORY_CARE_PROVIDER_SITE_OTHER): Payer: Self-pay | Admitting: Gastroenterology

## 2022-02-27 ENCOUNTER — Encounter: Payer: Self-pay | Admitting: Nurse Practitioner

## 2022-03-02 ENCOUNTER — Other Ambulatory Visit: Payer: Self-pay | Admitting: Nurse Practitioner

## 2022-03-02 DIAGNOSIS — F331 Major depressive disorder, recurrent, moderate: Secondary | ICD-10-CM

## 2022-03-10 ENCOUNTER — Telehealth: Payer: Self-pay

## 2022-03-10 DIAGNOSIS — G43009 Migraine without aura, not intractable, without status migrainosus: Secondary | ICD-10-CM

## 2022-03-10 MED ORDER — AIMOVIG 140 MG/ML ~~LOC~~ SOAJ: SUBCUTANEOUS | 5 refills | Status: DC

## 2022-03-10 NOTE — Telephone Encounter (Signed)
PA was sent 03/10/22 @ 8 pm and came back approved for AIMOVIG 140 mg injection 03/10/22 to 03/10/23.  Sent new rx to pharmacy  ?

## 2022-03-13 ENCOUNTER — Telehealth: Payer: Self-pay

## 2022-03-13 NOTE — Telephone Encounter (Signed)
Patient's prior authorization for Aimovig Sureclick 140mg /ml was approved on 03/10/2022 ?

## 2022-04-25 ENCOUNTER — Ambulatory Visit: Payer: Medicaid Other | Admitting: Nurse Practitioner

## 2022-08-09 ENCOUNTER — Encounter: Payer: Medicaid Other | Admitting: Nurse Practitioner

## 2022-08-26 ENCOUNTER — Encounter: Payer: Medicaid Other | Admitting: Nurse Practitioner

## 2022-09-17 ENCOUNTER — Emergency Department: Payer: Medicaid Other

## 2022-09-17 ENCOUNTER — Ambulatory Visit: Payer: Medicaid Other | Admitting: Nurse Practitioner

## 2022-09-17 ENCOUNTER — Other Ambulatory Visit: Payer: Self-pay

## 2022-09-17 ENCOUNTER — Encounter: Payer: Self-pay | Admitting: Emergency Medicine

## 2022-09-17 ENCOUNTER — Emergency Department
Admission: EM | Admit: 2022-09-17 | Discharge: 2022-09-17 | Disposition: A | Discharge status: Home / Self Care | Payer: Medicaid Other | Attending: Emergency Medicine | Admitting: Emergency Medicine

## 2022-09-17 DIAGNOSIS — M797 Fibromyalgia: Secondary | ICD-10-CM | POA: Diagnosis not present

## 2022-09-17 DIAGNOSIS — R202 Paresthesia of skin: Secondary | ICD-10-CM

## 2022-09-17 DIAGNOSIS — R531 Weakness: Secondary | ICD-10-CM

## 2022-09-17 LAB — BASIC METABOLIC PANEL
Anion gap: 9 (ref 5–15)
BUN: 11 mg/dL (ref 6–20)
CO2: 23 mmol/L (ref 22–32)
Calcium: 9.4 mg/dL (ref 8.9–10.3)
Chloride: 108 mmol/L (ref 98–111)
Creatinine, Ser: 0.73 mg/dL (ref 0.44–1.00)
GFR, Estimated: >60 mL/min (ref >60)
Glucose, Bld: 83 mg/dL (ref 70–99)
Potassium: 3.8 mmol/L (ref 3.5–5.1)
Sodium: 140 mmol/L (ref 135–145)

## 2022-09-17 LAB — CBC WITH DIFFERENTIAL/PLATELET
Abs Immature Granulocytes: 0.01 10*3/uL (ref 0.00–0.07)
Basophils Absolute: 0 10*3/uL (ref 0.0–0.1)
Basophils Relative: 0 %
Eosinophils Absolute: 0.1 10*3/uL (ref 0.0–0.5)
Eosinophils Relative: 2 %
HCT: 42.1 % (ref 36.0–46.0)
Hemoglobin: 14 g/dL (ref 12.0–15.0)
Immature Granulocytes: 0 %
Lymphocytes Relative: 30 %
Lymphs Abs: 2.2 10*3/uL (ref 0.7–4.0)
MCH: 28.9 pg (ref 26.0–34.0)
MCHC: 33.3 g/dL (ref 30.0–36.0)
MCV: 86.8 fL (ref 80.0–100.0)
Monocytes Absolute: 0.5 10*3/uL (ref 0.1–1.0)
Monocytes Relative: 6 %
Neutro Abs: 4.5 10*3/uL (ref 1.7–7.7)
Neutrophils Relative %: 62 %
Platelets: 308 10*3/uL (ref 150–400)
RBC: 4.85 MIL/uL (ref 3.87–5.11)
RDW: 13.1 % (ref 11.5–15.5)
WBC: 7.3 10*3/uL (ref 4.0–10.5)
nRBC: 0 % (ref 0.0–0.2)

## 2022-09-17 LAB — CBG MONITORING, ED: Glucose-Capillary: 79 mg/dL (ref 70–99)

## 2022-09-17 LAB — TROPONIN I (HIGH SENSITIVITY): Troponin I (High Sensitivity): 3 ng/L (ref <18)

## 2022-09-17 MED ORDER — GADOBUTROL 1 MMOL/ML IV SOLN
10.0000 mL | Freq: Once | INTRAVENOUS | Status: AC | PRN
  Administered 2022-09-17: 10 mL via INTRAVENOUS

## 2022-09-17 MED ORDER — HYDROMORPHONE HCL 1 MG/ML IJ SOLN
INTRAMUSCULAR | Status: AC
  Filled 2022-09-17: qty 0.5

## 2022-09-17 MED ORDER — HYDROMORPHONE HCL 1 MG/ML IJ SOLN
0.5000 mg | Freq: Once | INTRAMUSCULAR | Status: AC
  Administered 2022-09-17: 0.5 mg via INTRAVENOUS
  Filled 2022-09-17: qty 0.5

## 2022-09-17 MED ORDER — CYCLOBENZAPRINE HCL 10 MG PO TABS: 10.0000 mg | ORAL_TABLET | Freq: Three times a day (TID) | ORAL | 0 refills | Status: AC | PRN

## 2022-09-17 MED ORDER — CYCLOBENZAPRINE HCL 10 MG PO TABS
5.0000 mg | ORAL_TABLET | Freq: Once | ORAL | Status: DC
  Filled 2022-09-17: qty 1

## 2022-09-17 MED ORDER — HYDROMORPHONE HCL 1 MG/ML IJ SOLN
0.5000 mg | Freq: Once | INTRAMUSCULAR | Status: AC
  Administered 2022-09-17: 0.5 mg via INTRAVENOUS

## 2022-09-17 NOTE — ED Triage Notes (Signed)
Pt here with cp and numbness. Pt had a fall 2 months ago and states she felt something 'pop' in her neck but did not follow up on it. Pt now states her back is in severe pain radiating down her legs and now she states she cannot move her hands. Pt states cp that is centered but does nor radiate.

## 2022-09-17 NOTE — ED Provider Notes (Signed)
[facility] Provider Note    Event Date/Time   First MD Initiated Contact with Patient 09/17/22 1524     (approximate)   History   Chest Pain and Numbness   HPI  Andrea [name] is a 41 y.o. female  with pmh fibromyalgia, PTSD who presents with neck pain numbness back pain facial numbness.  Patient tells me that she had a fall about 3 months ago and since that time she has had entire neck and back pain.  In August she started having numbness in the right arm and about a week later the spread to the left arm.  Was coming and going but seems to be worse and now is having difficulty typing at work because of the numbness.  She has pain in her neck as well.  She also endorses numbness and weakness of the right lower extremity and low back pain.  Has urinated on herself sometimes last week but no more recent incontinence.  Has felt some mild constipation but no fecal incontinence.  She is still ambulating.  Patient denies diplopia difficulty speaking headache.  She has seen neurology in the past.  Last note I can see was from January 2022.  Patient has had multiple MRIs in the past.  In her 75s had an MRI that showed nonspecific white matter lesions.  Repeat MRI brain in 2017 showed scattered nonspecific white matter lesions in cerebral hemispheres bilaterally and left cerebellum had normal visual evoked potentials and MRI C-spine that was remarkable in 2018.  Repeat MRI brain in 2019 was stable to that from 2017.  Reportedly had MRI of her C-spine in 2020 that showed a syrinx and was diagnosed with fibromyalgia.  Again was having numbness and tingling and was found to have B1 and B12 deficiency.  Last MRI of the cervical spine in our system is from 2018 is normal.     Past Medical History:  Diagnosis Date   Abnormal finding on MRI of brain 01/02/2017   Anxiety and depression    see progress notes -sw   Common migraine with intractable migraine 01/02/2017   Polycystic  ovarian disease    Psoriasis    Rosacea    Seizure (HCC)    as child from trauma; no meds and no further seizures    Patient Active Problem List   Diagnosis Date Noted   Cervical high risk human papillomavirus (HPV) DNA test positive 06/01/2021   Chronic bilateral low back pain with bilateral sciatica 10/29/2020   Migraine without aura and without status migrainosus, not intractable 09/23/2020   BMI 34.0-34.9,adult 09/23/2020   Needs flu shot 09/23/2020   Encounter for screening mammogram for malignant neoplasm of breast 09/23/2020   Syrinx of spinal cord (HCC) 04/02/2020   Weakness 04/02/2020   Morbid obesity (HCC) 04/06/2019   Cough 03/26/2019   Acute non-recurrent pansinusitis 03/10/2019   Severe episode of recurrent major depressive disorder, without psychotic features (HCC) 03/10/2019   Generalized joint pain 03/10/2019   Primary insomnia 03/10/2019   Screening for malignant neoplasm of cervix 10/25/2018   Knee pain 10/23/2018   Triceps tendinitis 10/23/2018   Fracture of proximal phalanx of thumb 10/23/2018   Other constipation 08/11/2018   Family hx of colon cancer 08/11/2018   Episode of moderate major depression (HCC) 07/20/2018   Fatigue 07/20/2018   Fecal impaction (HCC)    Obstipation 06/17/2018   Acute retention of urine 06/17/2018   Constipation    Colitis 06/16/2018   Depression  06/16/2018   Hypokalemia 06/16/2018   Leukocytosis 06/16/2018   Acute upper respiratory infection 05/20/2018   Urinary tract infection with hematuria 05/20/2018   Dysuria 05/20/2018   Vaginal candida 05/20/2018   Bilateral lower extremity edema 05/20/2018   Other symptoms and signs involving the nervous system 05/20/2018   Generalized anxiety disorder 05/20/2018   Encounter for general adult medical examination with abnormal findings 05/20/2018   Family history of breast cancer in mother 05/20/2018   Osteoarthritis of knee 02/28/2017   Common migraine with intractable migraine  01/02/2017   White matter 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 01/02/2017   Postoperative wound cellulitis 05/08/2016   S/P cesarean section 04/24/2016   Status post primary low transverse cesarean section 04/24/2016   Oligohydramnios 04/19/2016   Supervision of pregnancy with history of pre-term labor in first trimester 10/31/2015     Physical Exam  Triage Vital Signs: ED Triage Vitals  Enc Vitals Group     BP 09/17/22 1436 (!) 160/100     Pulse Rate 09/17/22 1434 94     Resp 09/17/22 1434 18     Temp 09/17/22 1434 98.4 F (36.9 C)     Temp Source 09/17/22 1434 Oral     SpO2 09/17/22 1434 98 %     Weight 09/17/22 1435 238 lb 5.1 oz (108.1 kg)     Height 09/17/22 1435 5' 7.5" (1.715 m)     Head Circumference --      Peak Flow --      Pain Score 09/17/22 1435 10     Pain Loc --      Pain Edu? --      Excl. in GC? --     Most recent vital signs: Vitals:   09/17/22 1537 09/17/22 1830  BP: (!) 137/101 (!) 127/91  Pulse:  90  Resp:    Temp:    SpO2:  99%     General: Awake, no distress.  CV:  Good peripheral perfusion.  Resp:  Normal effort.  Abd:  No distention.  Neuro:             Awake, Alert, Oriented x 3  Other:  Aox3, nml speech  PERRL, EOMI, face symmetric, nml tongue movement  Subjective decreased sensation to light touch on the right side of the face compared to left No pronator drift in bilateral upper extremities 4-5 strength with elbow flexion extension on the right compared to 5 out of 5 on the left 3-5 with grip strength on the right, 4-5 on the left 5/5 with wrist extension bilaterally 4-5 strength with finger abduction bilaterally Sensation grossly intact in the BL upper and lower extremities  Strength testing on the right lower extremity with hip flexion is limited secondary to pain 5/5 strength with plantarflexion dorsiflexion in bilateral lower extremities    ED Results / Procedures / Treatments  Labs (all labs ordered are listed, but only abnormal  results are displayed) Labs Reviewed  BASIC METABOLIC PANEL  CBC WITH DIFFERENTIAL/PLATELET  CBG MONITORING, ED  TROPONIN I (HIGH SENSITIVITY)     EKG  EKG interpretation performed by myself: NSR, nml axis, nml intervals, no acute ischemic changes    RADIOLOGY    PROCEDURES:  Critical Care performed: No  Procedures    MEDICATIONS ORDERED IN ED: Medications  cyclobenzaprine (FLEXERIL) tablet 5 mg (has no administration in time range)  HYDROmorphone (DILAUDID) 1 MG/ML injection (has no administration in time range)  HYDROmorphone (DILAUDID) injection 0.5 mg (0.5 mg Intravenous  Given 09/17/22 1715)  gadobutrol (GADAVIST) 1 MMOL/ML injection 10 mL (10 mLs Intravenous Contrast Given 09/17/22 2000)  HYDROmorphone (DILAUDID) injection 0.5 mg (0.5 mg Intravenous Given 09/17/22 2118)     IMPRESSION / MDM / ASSESSMENT AND PLAN / ED COURSE  I reviewed the triage vital signs and the nursing notes.                              Patient's presentation is most consistent with acute complicated illness / injury requiring diagnostic workup.  Differential diagnosis includes, but is not limited to, demyelinating disease, cervical radiculopathy, cervical myelopathy, central cord syndrome related to progression of syrinx, functional neurologic disorder  Patient is a 41 year old female with long history of neurologic symptoms including bilateral hand numbness for which she has seen neurology multiple times and has had multiple MRIs of her brain and cervical spine presenting today because of worsening numbness and weakness in her bilateral arms and right lower extremity.  Patient tells me that the numbness in her arms started several months ago but I do see documented that she has had numbness in her hands for several years.  She attributes it to a fall that she had.  She also complains of intermittent entire facial numbness and right lower extremity numbness and weakness.  Her exam is notable  for decreased grip strength on the right compared to left there is no pronator drift hip flexion strength on the right is somewhat limited secondary to pain but she has normal strength with plantarflexion and dorsiflexion cranial nerves are intact.  I see documented that patient had a syrinx on [place] but MRI C-spine done from 2019 in our system does not show this.  She has had multiple MRIs that showed nonspecific white matter lesions that have been stable.  She does not carry a diagnosis of MS and has seen neurology in the past.  Her distribution of symptoms does really not fit any vascular territory.  Given the bilateral arm numbness and weakness.  Will obtain MRI of the brain as well to rule out acute demyelinating disease.  We will treat her pain with IV opiates.    Patients MRI of the brain is stable, no new white matter lesions. CT C spine shows small stable syrinx, no change. No other acute findings. Suspect that patient's neck pain is largely due to cervical muscle spasm. No imaging findings to suggest central etiology. I have referred her to Neurology and recommended PCP f/u for referral for PT.     FINAL CLINICAL IMPRESSION(S) / ED DIAGNOSES   Final diagnoses:  Paresthesia  Weakness     Rx / DC Orders   ED Discharge Orders          Ordered    cyclobenzaprine (FLEXERIL) 10 MG tablet  3 times daily PRN        09/17/22 2028             Note:  This document was prepared using Dragon voice recognition software and may include unintentional dictation errors.   Rada Hay, MD 09/17/22 2325

## 2022-09-17 NOTE — ED Provider Triage Note (Signed)
Emergency Medicine Provider Triage Evaluation Note  Andrea [name] , a 41 y.o. female  was evaluated in triage.  Pt complains of neck pain, chest pain.  Patient reports that she has had neck pain for the past 4 weeks after she had a slip and fall.  She reports that she felt a pop in her neck and has had radiation down her right arm.  She reports that over the past 3 weeks she has also developed radiation down her left arm.  She reports that she has pain with any movement of her arms though she is able to move her arms.  She reports that this morning she began to have pain in her chest as well.  She reports that she woke up with this.  She denies shortness of breath.  She denies abdominal pain, nausea, vomiting.  No constitutional symptoms.  Review of Systems  Positive: Neck pain, chest pain Negative: Abdominal pain, fever  Physical Exam  BP (!) 160/100   Pulse 94   Temp 98.4 F (36.9 C) (Oral)   Resp 18   Ht 5' 7.5" (1.715 m)   Wt 108.1 kg   LMP 09/17/2022   SpO2 98%   BMI 36.77 kg/m  Gen:   Awake, no distress   Resp:  Normal effort  MSK:   Moves extremities without difficulty  Other:  Tenderness to palpation to cervical area, trapezius, and into shoulders.  Patient is able to actively forward flex and abduct arms to 90 degrees.  She is able to make okay sign and hold closed against resistance.  Reports pain with movement of her neck.  She is tearful  Medical Decision Making  Medically screening exam initiated at 2:38 PM.  Appropriate orders placed.  Andrea [name] was informed that the remainder of the evaluation will be completed by another provider, this initial triage assessment does not replace that evaluation, and the importance of remaining in the ED until their evaluation is complete.     Marquette Old, PA-C 09/17/22 1446

## 2022-09-17 NOTE — Discharge Instructions (Addendum)
Your MRI of your spine and brain did not show any new changes.  You do have a small syrinx in your spine but I do not think is causing your weakness.  Please follow-up with neurology.  You should take ibuprofen for your neck pain.  You can also take the Flexeril which I have sent to your pharmacy.  Please do not drive when you are taking this medication as it can make you sleepy.

## 2022-09-17 NOTE — ED Notes (Signed)
Pt called for EKG and triage, pt was in the bathroom and needed time to use restroom.

## 2022-10-03 ENCOUNTER — Encounter: Payer: Medicaid Other | Admitting: Nurse Practitioner

## 2023-01-21 LAB — HM HIV SCREENING LAB: HM HIV Screening: NEGATIVE

## 2023-01-21 LAB — CMP 10231: EGFR (Non-African Amer.): 112

## 2023-01-21 LAB — HEMOGLOBIN A1C: A1c: 5.1

## 2023-01-22 ENCOUNTER — Telehealth: Payer: Self-pay | Admitting: Nurse Practitioner

## 2023-01-22 NOTE — Telephone Encounter (Signed)
MR (305 pgs) mailed to to [facility]: [place], [place]

## 2023-02-11 ENCOUNTER — Telehealth: Payer: Self-pay

## 2023-02-11 NOTE — Telephone Encounter (Signed)
Completed P.A. for patient's Aimovig and it was approved.

## 2023-03-18 ENCOUNTER — Encounter: Payer: Self-pay | Admitting: Internal Medicine

## 2023-03-18 ENCOUNTER — Ambulatory Visit (INDEPENDENT_AMBULATORY_CARE_PROVIDER_SITE_OTHER): Payer: Medicaid Other

## 2023-03-18 ENCOUNTER — Ambulatory Visit: Payer: Medicaid Other | Attending: Internal Medicine | Admitting: Internal Medicine

## 2023-03-18 ENCOUNTER — Ambulatory Visit: Payer: Medicaid Other

## 2023-03-18 VITALS — BP 115/75 | HR 63 | Resp 16 | Ht 66.5 in | Wt 169.0 lb

## 2023-03-18 DIAGNOSIS — M5442 Lumbago with sciatica, left side: Secondary | ICD-10-CM

## 2023-03-18 DIAGNOSIS — M170 Bilateral primary osteoarthritis of knee: Secondary | ICD-10-CM

## 2023-03-18 DIAGNOSIS — M130 Polyarthritis, unspecified: Secondary | ICD-10-CM | POA: Diagnosis not present

## 2023-03-18 DIAGNOSIS — M79641 Pain in right hand: Secondary | ICD-10-CM | POA: Diagnosis not present

## 2023-03-18 DIAGNOSIS — R768 Other specified abnormal immunological findings in serum: Secondary | ICD-10-CM

## 2023-03-18 DIAGNOSIS — G43009 Migraine without aura, not intractable, without status migrainosus: Secondary | ICD-10-CM | POA: Diagnosis not present

## 2023-03-18 DIAGNOSIS — R9082 White matter disease, unspecified: Secondary | ICD-10-CM

## 2023-03-18 DIAGNOSIS — M5441 Lumbago with sciatica, right side: Secondary | ICD-10-CM

## 2023-03-18 DIAGNOSIS — E559 Vitamin D deficiency, unspecified: Secondary | ICD-10-CM

## 2023-03-18 DIAGNOSIS — M79642 Pain in left hand: Secondary | ICD-10-CM

## 2023-03-18 DIAGNOSIS — L408 Other psoriasis: Secondary | ICD-10-CM

## 2023-03-18 DIAGNOSIS — R5383 Other fatigue: Secondary | ICD-10-CM

## 2023-03-18 DIAGNOSIS — G8929 Other chronic pain: Secondary | ICD-10-CM

## 2023-03-18 DIAGNOSIS — R634 Abnormal weight loss: Secondary | ICD-10-CM

## 2023-03-18 NOTE — Progress Notes (Unsigned)
Office Visit Note  Patient: Andrea [name]             Date of Birth: 1981/06/30           MRN: [id number]             PCP: Sallyanne Kuster, NP Referring: Shelby Dubin, FNP Visit Date: 03/18/2023 Occupation: @  Subjective:  New Patient (Initial Visit) (Patient states she has joint pain in shoulders, elbows, wrists, fingers, knees, ankles, and toes. Patient states she gets tingling in her toes and fingers. Patient states she lost feeling on the end of her right big toe. Patient states her pain is feels as though it is deep in the bone and she feels her whole body is inflamed. )   History of Present Illness: Andrea [name] is a 42 y.o. female ***   Migraines and MRI findings since 13 FMS long time Fatigue worse 5-6 years  Current events since about August last year Pain feels more acute, like joint coming out of socket and cnanot tolerate ROM Sees visible swleling on tendons on back of hands DIP nodules right 2nd left 3rd right 4th pip Right big toe numbness Numbness and tingling coming and going in fingers and toes 70 lb weight loss (!?) ***   Activities of Daily Living:  Patient reports morning stiffness for 24 hours.   Patient Reports nocturnal pain.  Difficulty dressing/grooming: Reports Difficulty climbing stairs: Reports Difficulty getting out of chair: Reports Difficulty using hands for taps, buttons, cutlery, and/or writing: Reports  Review of Systems  Constitutional:  Positive for fatigue.  HENT:  Positive for mouth dryness. Negative for mouth sores.   Eyes:  Positive for dryness.  Respiratory:  Positive for shortness of breath.   Cardiovascular:  Negative for chest pain and palpitations.  Gastrointestinal:  Positive for blood in stool. Negative for constipation and diarrhea.  Endocrine: Positive for increased urination.  Genitourinary:  Negative for involuntary urination.  Musculoskeletal:  Positive for joint pain, gait problem, joint pain, joint  swelling, myalgias, muscle weakness, morning stiffness, muscle tenderness and myalgias.  Skin:  Positive for color change, rash, hair loss and sensitivity to sunlight.  Allergic/Immunologic: Positive for susceptible to infections.  Neurological:  Positive for dizziness and headaches.  Hematological:  Negative for swollen glands.  Psychiatric/Behavioral:  Positive for depressed mood and sleep disturbance. The patient is nervous/anxious.     PMFS History:  Patient Active Problem List   Diagnosis Date Noted   Positive ANA (antinuclear antibody) 03/18/2023   Other psoriasis 03/18/2023   Cervical high risk human papillomavirus (HPV) DNA test positive 06/01/2021   Chronic bilateral low back pain with bilateral sciatica 10/29/2020   Migraine without aura and without status migrainosus, not intractable 09/23/2020   BMI 34.0-34.9,adult 09/23/2020   Needs flu shot 09/23/2020   Encounter for screening mammogram for malignant neoplasm of breast 09/23/2020   Syrinx of spinal cord 04/02/2020   Weakness 04/02/2020   Morbid obesity 04/06/2019   Cough 03/26/2019   Acute non-recurrent pansinusitis 03/10/2019   Severe episode of recurrent major depressive disorder, without psychotic features 03/10/2019   Generalized joint pain 03/10/2019   Primary insomnia 03/10/2019   Screening for malignant neoplasm of cervix 10/25/2018   Knee pain 10/23/2018   Triceps tendinitis 10/23/2018   Fracture of proximal phalanx of thumb 10/23/2018   Other constipation 08/11/2018   Family hx of colon cancer 08/11/2018   Episode of moderate major depression 07/20/2018   Fatigue 07/20/2018   Fecal  impaction    Obstipation 06/17/2018   Acute retention of urine 06/17/2018   Constipation    Colitis 06/16/2018   Depression 06/16/2018   Hypokalemia 06/16/2018   Leukocytosis 06/16/2018   Acute upper respiratory infection 05/20/2018   Urinary tract infection with hematuria 05/20/2018   Dysuria 05/20/2018   Vaginal  candida 05/20/2018   Bilateral lower extremity edema 05/20/2018   Other symptoms and signs involving the nervous system 05/20/2018   Generalized anxiety disorder 05/20/2018   Encounter for general adult medical examination with abnormal findings 05/20/2018   Family history of breast cancer in mother 05/20/2018   Osteoarthritis of knee 02/28/2017   Common migraine with intractable migraine 01/02/2017   White matter 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 01/02/2017   Postoperative wound cellulitis 05/08/2016   S/P cesarean section 04/24/2016   Status post primary low transverse cesarean section 04/24/2016   Oligohydramnios 04/19/2016   Supervision of pregnancy with history of pre-term labor in first trimester 10/31/2015    Past Medical History:  Diagnosis Date   Abnormal finding on MRI of brain 01/02/2017   Anxiety and depression    see progress notes -sw   Common migraine with intractable migraine 01/02/2017   Fibromyalgia    Polycystic ovarian disease    Psoriasis    Rosacea    Seizure    as child from trauma; no meds and no further seizures    Family History  Problem Relation Age of Onset   Cancer Mother    Breast cancer Mother        early 36's   Bone cancer Mother    Migraines Mother    Brain cancer Mother    Cancer Father    Colon cancer Father    Prostate cancer Father    Liver cancer Father    Migraines Sister    Brain cancer Maternal Grandmother    Migraines Maternal Grandmother    Breast cancer Maternal Grandmother        <50   Breast cancer Maternal Great-grandmother    Brain cancer Maternal Great-grandmother    Past Surgical History:  Procedure Laterality Date   CESAREAN SECTION N/A 04/24/2016   Procedure: CESAREAN SECTION;  Surgeon: Brock Bad, MD;  Location: [facility] BIRTHING SUIT[facility];  Service: Obstetrics;  Laterality: N/A;   CHOLECYSTECTOMY     TONSILLECTOMY     TONSILLECTOMY AND ADENOIDECTOMY     Social History   Social History Narrative   Lives at home w/  her children   Right-handed   Caffeine: 1 cup coffee per day   One story home   Immunization History  Administered Date(s) Administered   Influenza Inj Mdck Quad Pf 12/18/2018, 09/05/2020   Influenza,inj,Quad PF,6+ Mos 10/31/2015, 10/15/2017   Influenza-Unspecified 08/23/2021   Moderna SARS-COV2 Booster Vaccination 08/23/2021   PFIZER(Purple Top)SARS-COV-2 Vaccination 07/03/2020, 07/24/2020   Tdap 04/27/2016     Objective: Vital Signs: BP 115/75 (BP Location: Right Arm, Patient Position: Sitting, Cuff Size: Normal)   Pulse 63   Resp 16   Ht 5' 6.5" (1.689 m)   Wt 169 lb (76.7 kg)   LMP 02/27/2023   BMI 26.87 kg/m    Physical Exam   Musculoskeletal Exam: ***  CDAI Exam: CDAI Score: -- Patient Global: --; Provider Global: -- Swollen: --; Tender: -- Joint Exam 03/18/2023   No joint exam has been documented for this visit   There is currently no information documented on the homunculus. Go to the Rheumatology activity and complete the homunculus  joint exam.  Investigation: No additional findings.  Imaging: XR Hand 2 View Right  Result Date: 03/18/2023 X-ray right hand 2 views Radiocarpal joints.  Normal.  Large cyst in capitate otherwise unremarkable carpal bones.  MCP PIP and DIP joints appear normal.  No erosions or abnormal calcifications seen.  Bone mineralization appears normal. Impression Few cystic changes in carpal bones otherwise normal-appearing hand x-ray  XR Hand 2 View Left  Result Date: 03/18/2023 X-ray left hand 2 views Radiocarpal joint space appears normal.  Mild degenerative appearing changes at first [facility] and MCP joint with CMC joint calcification present.  Other MCP PIP and DIP joints appear normal.  No erosions or abnormal calcifications seen.  Bone mineralization appears normal. Impression Mild proximal thumb degenerative changes otherwise normal x-ray   Recent Labs: Lab Results  Component Value Date   WBC 7.3 09/17/2022   HGB 14.0 09/17/2022    PLT 308 09/17/2022   NA 140 09/17/2022   K 3.8 09/17/2022   CL 108 09/17/2022   CO2 23 09/17/2022   GLUCOSE 83 09/17/2022   BUN 11 09/17/2022   CREATININE 0.73 09/17/2022   BILITOT 0.2 08/24/2021   ALKPHOS 97 08/24/2021   AST 21 08/24/2021   ALT 19 08/24/2021   PROT 6.3 08/24/2021   ALBUMIN 4.3 08/24/2021   CALCIUM 9.4 09/17/2022   GFRAA 112 03/14/2020    Speciality Comments: No specialty comments available.  Procedures:  No procedures performed Allergies: Codeine, Hydrocodone, Metronidazole, and Morphine and related   Assessment / Plan:     Visit Diagnoses: Positive ANA (antinuclear antibody) - Plan: Anti-scleroderma antibody, RNP Antibody, Anti-Smith antibody, Sjogrens syndrome-A extractable nuclear antibody, Sjogrens syndrome-B extractable nuclear antibody, Anti-DNA antibody, double-stranded, C3 and C4  Migraine without aura and without status migrainosus, not intractable  Chronic bilateral low back pain with bilateral sciatica  Primary osteoarthritis of both knees  White matter 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  Other psoriasis - Plan: Sedimentation rate, C-reactive protein  Polyarthritis - Plan: XR Hand 2 View Right, XR Hand 2 View Left  Vitamin D deficiency  Other fatigue - Plan: B12 and Folate Panel, Vitamin B6, Vitamin B1  Unintentional weight loss - Plan: B12 and Folate Panel, Vitamin B6, Vitamin B1  Orders: Orders Placed This Encounter  Procedures   XR Hand 2 View Right   XR Hand 2 View Left   Anti-scleroderma antibody   RNP Antibody   Anti-Smith antibody   Sjogrens syndrome-A extractable nuclear antibody   Sjogrens syndrome-B extractable nuclear antibody   Anti-DNA antibody, double-stranded   C3 and C4   Sedimentation rate   C-reactive protein   B12 and Folate Panel   Vitamin B6   Vitamin B1   No orders of the defined types were placed in this encounter.   Face-to-face time spent with patient was *** minutes. Greater than 50% of time was spent in  counseling and coordination of care.  Follow-Up Instructions: Return in about 4 weeks (around 04/15/2023) for New pt +ANA/FMS/?OA/?PsA f/u 58mo.   Fuller Plan, MD  Note - This record has been created using [employer].  Chart creation errors have been sought, but may not always  have been located. Such creation errors do not reflect on  the standard of medical care.

## 2023-03-22 LAB — B12 AND FOLATE PANEL
Folate: 17.5 ng/mL
Vitamin B-12: 435 pg/mL (ref 200–1100)

## 2023-03-22 LAB — VITAMIN B1: Vitamin B1 (Thiamine): 8 nmol/L (ref 8–30)

## 2023-03-22 LAB — ANTI-SCLERODERMA ANTIBODY: Scleroderma (Scl-70) (ENA) Antibody, IgG: <1 AI

## 2023-03-22 LAB — SEDIMENTATION RATE: Sed Rate: 6 mm/h (ref 0–20)

## 2023-03-22 LAB — RNP ANTIBODY: Ribonucleic Protein(ENA) Antibody, IgG: <1 AI

## 2023-03-22 LAB — SJOGRENS SYNDROME-A EXTRACTABLE NUCLEAR ANTIBODY: SSA (Ro) (ENA) Antibody, IgG: <1 AI

## 2023-03-22 LAB — VITAMIN B6: Vitamin B6: 13.8 ng/mL (ref 2.1–21.7)

## 2023-03-22 LAB — C3 AND C4
C3 Complement: 131 mg/dL (ref 83–193)
C4 Complement: 24 mg/dL (ref 15–57)

## 2023-03-22 LAB — ANTI-DNA ANTIBODY, DOUBLE-STRANDED: ds DNA Ab: <1 IU/mL

## 2023-03-22 LAB — C-REACTIVE PROTEIN: CRP: <3 mg/L (ref <8.0)

## 2023-03-22 LAB — SJOGRENS SYNDROME-B EXTRACTABLE NUCLEAR ANTIBODY: SSB (La) (ENA) Antibody, IgG: <1 AI

## 2023-03-22 LAB — ANTI-SMITH ANTIBODY: ENA SM Ab Ser-aCnc: <1 AI

## 2023-04-15 NOTE — Progress Notes (Signed)
Office Visit Note  Patient: Andrea [name]             Date of Birth: [birth year]           MRN: [id number]             PCP: Sallyanne Kuster, NP Referring: Sallyanne Kuster, NP Visit Date: 04/16/2023   Subjective:  Follow-up   History of Present Illness: Andrea [name] is a 42 y.o. female here for follow up for joint pain in multiple areas with positive ANA and psoriasis.  Antibody testing at initial visit was entirely unremarkable.  X-ray of bilateral hands did not show significant extent of degenerative arthritis.  She still has joint pain and stiffness and intermittent swelling ongoing as before.  Currently taking meloxicam and some Tylenol arthritis in the mornings which alleviates symptoms she notices a lot more swelling and stiffness without it.  Has rashes most often at the back of the neck and sometimes occurring on her elbows and inside the ears which she treats with lotion during active symptoms.  Previous HPI 03/18/23 Andrea [name] is a 42 y.o. female here for evaluation of joint pain in multiple areas worsening for about the past 6 or 7 months.  She has a longstanding history of fatigue body aches and migraine headaches.  Initially developed severe migraines with some nonspecific MRI white matter changes identified at age 24.  She was found to have fibromyalgia syndrome characterized by pain and sensitivity that is been usually persistent with some varying intensity.  Fatigue has been a problem worse for about the past 5 to 6 years is not associated with any specific change in sleep pattern or other focal symptoms that she can recall.  Current issue with increased pain since around August of last year with a much more acute or severe painful sensation in multiple areas throughout her arms and legs gets a feeling like there is swelling or joint trying to come out of its socket.  There is not as much pain and sensitivity to touch versus weightbearing and range of motion.  She mostly  does not notice a lot of visible joint swelling erythema or warmth occasionally notices swelling or the tendons standing out on the backs of her hands.  She has noticed new joint nodules developing on multiple finger joints.  She experiences numbness and tingling sensations in the tips of the fingers and the toes this is coming and going.  Not associated with particular time of day activity or position.  Right great toe numbness is more persistent.  Of note she did discontinue several medications that she felt were not improving symptoms much.  Also had some unintentional weight loss up to 70 pounds throughout last year earlier this year.    Review of Systems  Constitutional:  Positive for fatigue.  HENT:  Negative for mouth sores and mouth dryness.   Eyes:  Negative for dryness.  Respiratory:  Positive for shortness of breath.   Cardiovascular:  Negative for chest pain and palpitations.  Gastrointestinal:  Positive for blood in stool. Negative for constipation and diarrhea.  Endocrine: Positive for increased urination.  Genitourinary:  Negative for involuntary urination.  Musculoskeletal:  Positive for joint pain, gait problem, joint pain, joint swelling, myalgias, muscle weakness, morning stiffness, muscle tenderness and myalgias.  Skin:  Positive for hair loss. Negative for color change, rash and sensitivity to sunlight.  Allergic/Immunologic: Positive for susceptible to infections.  Neurological:  Positive for dizziness. Negative for headaches.  Hematological:  Negative for swollen glands.  Psychiatric/Behavioral:  Negative for depressed mood and sleep disturbance. The patient is not nervous/anxious.     PMFS History:  Patient Active Problem List   Diagnosis Date Noted   Finger swelling 04/16/2023   Positive ANA (antinuclear antibody) 03/18/2023   Other psoriasis 03/18/2023   Cervical high risk human papillomavirus (HPV) DNA test positive 06/01/2021   Chronic bilateral low back pain  with bilateral sciatica 10/29/2020   Migraine without aura and without status migrainosus, not intractable 09/23/2020   BMI 34.0-34.9,adult 09/23/2020   Needs flu shot 09/23/2020   Encounter for screening mammogram for malignant neoplasm of breast 09/23/2020   Syrinx of spinal cord (HCC) 04/02/2020   Weakness 04/02/2020   Morbid obesity (HCC) 04/06/2019   Cough 03/26/2019   Acute non-recurrent pansinusitis 03/10/2019   Severe episode of recurrent major depressive disorder, without psychotic features (HCC) 03/10/2019   Generalized joint pain 03/10/2019   Primary insomnia 03/10/2019   Screening for malignant neoplasm of cervix 10/25/2018   Knee pain 10/23/2018   Triceps tendinitis 10/23/2018   Fracture of proximal phalanx of thumb 10/23/2018   Other constipation 08/11/2018   Family hx of colon cancer 08/11/2018   Episode of moderate major depression (HCC) 07/20/2018   Fatigue 07/20/2018   Fecal impaction (HCC)    Obstipation 06/17/2018   Acute retention of urine 06/17/2018   Constipation    Colitis 06/16/2018   Depression 06/16/2018   Hypokalemia 06/16/2018   Leukocytosis 06/16/2018   Acute upper respiratory infection 05/20/2018   Urinary tract infection with hematuria 05/20/2018   Dysuria 05/20/2018   Vaginal candida 05/20/2018   Bilateral lower extremity edema 05/20/2018   Other symptoms and signs involving the nervous system 05/20/2018   Generalized anxiety disorder 05/20/2018   Encounter for general adult medical examination with abnormal findings 05/20/2018   Family history of breast cancer in mother 05/20/2018   Osteoarthritis of knee 02/28/2017   Common migraine with intractable migraine 01/02/2017   White matter 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 01/02/2017   Postoperative wound cellulitis 05/08/2016   S/P cesarean section 04/24/2016   Status post primary low transverse cesarean section 04/24/2016   Oligohydramnios 04/19/2016   Supervision of pregnancy with history of  pre-term labor in first trimester 10/31/2015    Past Medical History:  Diagnosis Date   Abnormal finding on MRI of brain 01/02/2017   Anxiety and depression    see progress notes -sw   Common migraine with intractable migraine 01/02/2017   Fibromyalgia    Polycystic ovarian disease    Psoriasis    Rosacea    Seizure (HCC)    as child from trauma; no meds and no further seizures    Family History  Problem Relation Age of Onset   Cancer Mother    Breast cancer Mother        early 64's   Bone cancer Mother    Migraines Mother    Brain cancer Mother    Cancer Father    Colon cancer Father    Prostate cancer Father    Liver cancer Father    Migraines Sister    Brain cancer Maternal Grandmother    Migraines Maternal Grandmother    Breast cancer Maternal Grandmother        <50   Breast cancer Maternal Great-grandmother    Brain cancer Maternal Great-grandmother    Past Surgical History:  Procedure Laterality Date   CESAREAN SECTION N/A 04/24/2016   Procedure: CESAREAN SECTION;  Surgeon: Brock Bad, MD;  Location: [facility] BIRTHING SUITES;  Service: Obstetrics;  Laterality: N/A;   CHOLECYSTECTOMY     TONSILLECTOMY     TONSILLECTOMY AND ADENOIDECTOMY     Social History   Social History Narrative   Lives at home w/ her children   Right-handed   Caffeine: 1 cup coffee per day   One story home   Immunization History  Administered Date(s) Administered   Influenza Inj Mdck Quad Pf 12/18/2018, 09/05/2020   Influenza,inj,Quad PF,6+ Mos 10/31/2015, 10/15/2017   Influenza-Unspecified 08/23/2021   Moderna SARS-COV2 Booster Vaccination 08/23/2021   PFIZER(Purple Top)SARS-COV-2 Vaccination 07/03/2020, 07/24/2020   Tdap 04/27/2016     Objective: Vital Signs: BP 117/76 (BP Location: Left Arm, Patient Position: Sitting, Cuff Size: Normal)   Pulse 79   Resp 16   Ht 5\' 7"  (1.702 m)   Wt 168 lb (76.2 kg)   LMP 03/22/2023   BMI 26.31 kg/m    Physical Exam Cardiovascular:      Rate and Rhythm: Normal rate and regular rhythm.  Pulmonary:     Effort: Pulmonary effort is normal.     Breath sounds: Normal breath sounds.  Skin:    General: Skin is warm and dry.     Findings: Rash present.     Comments: Erythematous rash base of occiput with overlying scale  Neurological:     Mental Status: She is alert.  Psychiatric:        Mood and Affect: Mood normal.      Musculoskeletal Exam:  Elbows full ROM no tenderness or swelling Wrists full ROM no tenderness or swelling Fingers full ROM throughout, DIP nodules right 2nd and left 3rd fingers, no synovitis Knees full ROM no tenderness or swelling Ankles full ROM no tenderness or swelling MTPs full ROM no tenderness or swelling  Investigation: No additional findings.  Imaging: No results found.  Recent Labs: Lab Results  Component Value Date   WBC 7.3 09/17/2022   HGB 14.0 09/17/2022   PLT 308 09/17/2022   NA 140 09/17/2022   K 3.8 09/17/2022   CL 108 09/17/2022   CO2 23 09/17/2022   GLUCOSE 83 09/17/2022   BUN 11 09/17/2022   CREATININE 0.73 09/17/2022   BILITOT 0.2 08/24/2021   ALKPHOS 97 08/24/2021   AST 21 08/24/2021   ALT 19 08/24/2021   PROT 6.3 08/24/2021   ALBUMIN 4.3 08/24/2021   CALCIUM 9.4 09/17/2022   GFRAA 112 03/14/2020    Speciality Comments: No specialty comments available.  Procedures:  No procedures performed Allergies: Codeine, Hydrocodone, Metronidazole, and Morphine and codeine   Assessment / Plan:     Visit Diagnoses: Generalized joint pain Finger swelling Primary osteoarthritis of both knees- Plan: DULoxetine (CYMBALTA) 30 MG capsule  Widespread body aches appears consistent for fibromyalgia syndrome may have some mild generalized osteoarthritis as well.  There could be some very low-grade joint inflammation but not enough to recommend systemic therapy.  Can continue the NSAIDs as needed.  Recommend starting on Cymbalta 30 mg daily for chronic pain related to  fibromyalgia.  If joint synovitis starts becoming more apparent in follow-up could revisit trial of systemic medication.  Other psoriasis - Plan: fluocinonide (LIDEX) 0.05 % external solution  Rash on scalp consistent with psoriasis but does not have very much extensive disease.  Sent prescription for scalp steroid solution for affected area.  Generalized anxiety disorder  Was previously on medicine for anxiety before take she took herself off all her medications  last year.  Discussed duloxetine may have some benefit or effect with mood symptoms though primary indication for prescription is pain syndrome.  Orders: No orders of the defined types were placed in this encounter.  Meds ordered this encounter  Medications   DULoxetine (CYMBALTA) 30 MG capsule    Sig: Take 1 capsule (30 mg total) by mouth daily.    Dispense:  30 capsule    Refill:  2   fluocinonide (LIDEX) 0.05 % external solution    Sig: Apply 1 Application topically 2 (two) times daily as needed.    Dispense:  60 mL    Refill:  0     Follow-Up Instructions: Return in about 2 months (around 06/16/2023) for OA/FMS/PsO NSAID/duloxetine f/u 2mos.   Fuller Plan, MD  Note - This record has been created using [employer].  Chart creation errors have been sought, but may not always  have been located. Such creation errors do not reflect on  the standard of medical care.

## 2023-04-16 ENCOUNTER — Ambulatory Visit: Payer: Medicaid Other | Attending: Internal Medicine | Admitting: Internal Medicine

## 2023-04-16 ENCOUNTER — Encounter: Payer: Self-pay | Admitting: Internal Medicine

## 2023-04-16 VITALS — BP 117/76 | HR 79 | Resp 16 | Ht 67.0 in | Wt 168.0 lb

## 2023-04-16 DIAGNOSIS — L408 Other psoriasis: Secondary | ICD-10-CM | POA: Diagnosis not present

## 2023-04-16 DIAGNOSIS — M7989 Other specified soft tissue disorders: Secondary | ICD-10-CM

## 2023-04-16 DIAGNOSIS — M2550 Pain in unspecified joint: Secondary | ICD-10-CM | POA: Diagnosis not present

## 2023-04-16 DIAGNOSIS — F411 Generalized anxiety disorder: Secondary | ICD-10-CM

## 2023-04-16 DIAGNOSIS — M170 Bilateral primary osteoarthritis of knee: Secondary | ICD-10-CM | POA: Diagnosis not present

## 2023-04-16 MED ORDER — FLUOCINONIDE 0.05 % EX SOLN
1.0000 | Freq: Two times a day (BID) | CUTANEOUS | 0 refills | Status: DC | PRN
Start: 2023-04-16 — End: 2023-09-24

## 2023-04-16 MED ORDER — DULOXETINE HCL 30 MG PO CPEP
30.0000 mg | ORAL_CAPSULE | Freq: Every day | ORAL | 2 refills | Status: DC
Start: 2023-04-16 — End: 2023-07-07

## 2023-05-26 ENCOUNTER — Encounter: Payer: Medicaid Other | Admitting: Internal Medicine

## 2023-06-15 NOTE — Progress Notes (Signed)
Office Visit Note  Patient: Andrea [name]             Date of Birth: [birth year]           MRN: [id number]             PCP: Shelby Dubin, FNP Referring: Sallyanne Kuster, NP Visit Date: 06/16/2023   Subjective:  Follow-up   History of Present Illness: Andrea [name] is a 43 y.o. female here for follow up for ongoing joint pain and stiffness of multiple areas with osteoarthritis fibromyalgia syndrome and possible psoriatic disease inflammation.  Resuming the Cymbalta at 30 mg daily since previous visit feels like this is partially helpful but still only making a small impact in her overall joint pain and stiffness and severe fatigue.  She is also concerned about seeing more pronounced swelling in her fingers especially early morning.  Previous HPI 04/16/23 Andrea [name] is a 42 y.o. female here for follow up for joint pain in multiple areas with positive ANA and psoriasis.  Antibody testing at initial visit was entirely unremarkable.  X-ray of bilateral hands did not show significant extent of degenerative arthritis.  She still has joint pain and stiffness and intermittent swelling ongoing as before.  Currently taking meloxicam and some Tylenol arthritis in the mornings which alleviates symptoms she notices a lot more swelling and stiffness without it.  Has rashes most often at the back of the neck and sometimes occurring on her elbows and inside the ears which she treats with lotion during active symptoms.   Previous HPI 03/18/23 Andrea [name] is a 42 y.o. female here for evaluation of joint pain in multiple areas worsening for about the past 6 or 7 months.  She has a longstanding history of fatigue body aches and migraine headaches.  Initially developed severe migraines with some nonspecific MRI white matter changes identified at age 66.  She was found to have fibromyalgia syndrome characterized by pain and sensitivity that is been usually persistent with some varying intensity.  Fatigue  has been a problem worse for about the past 5 to 6 years is not associated with any specific change in sleep pattern or other focal symptoms that she can recall.  Current issue with increased pain since around August of last year with a much more acute or severe painful sensation in multiple areas throughout her arms and legs gets a feeling like there is swelling or joint trying to come out of its socket.  There is not as much pain and sensitivity to touch versus weightbearing and range of motion.  She mostly does not notice a lot of visible joint swelling erythema or warmth occasionally notices swelling or the tendons standing out on the backs of her hands.  She has noticed new joint nodules developing on multiple finger joints.  She experiences numbness and tingling sensations in the tips of the fingers and the toes this is coming and going.  Not associated with particular time of day activity or position.  Right great toe numbness is more persistent.  Of note she did discontinue several medications that she felt were not improving symptoms much.  Also had some unintentional weight loss up to 70 pounds throughout last year earlier this year.    Review of Systems  Constitutional:  Positive for fatigue.  HENT:  Negative for mouth sores and mouth dryness.   Eyes:  Negative for dryness.  Respiratory:  Positive for shortness of breath.   Cardiovascular:  Negative for chest  pain and palpitations.  Gastrointestinal:  Negative for blood in stool, constipation and diarrhea.  Endocrine: Negative for increased urination.  Genitourinary:  Negative for involuntary urination.  Musculoskeletal:  Positive for joint pain, gait problem, joint pain, joint swelling, myalgias, muscle weakness, morning stiffness, muscle tenderness and myalgias.  Skin:  Positive for rash and hair loss. Negative for color change and sensitivity to sunlight.  Allergic/Immunologic: Negative for susceptible to infections.  Neurological:   Positive for dizziness. Negative for headaches.  Hematological:  Negative for swollen glands.  Psychiatric/Behavioral:  Negative for depressed mood and sleep disturbance. The patient is not nervous/anxious.     PMFS History:  Patient Active Problem List   Diagnosis Date Noted   High risk medication use 06/16/2023   Finger swelling 04/16/2023   Positive ANA (antinuclear antibody) 03/18/2023   Other psoriasis 03/18/2023   Cervical high risk human papillomavirus (HPV) DNA test positive 06/01/2021   Chronic bilateral low back pain with bilateral sciatica 10/29/2020   Migraine without aura and without status migrainosus, not intractable 09/23/2020   BMI 34.0-34.9,adult 09/23/2020   Needs flu shot 09/23/2020   Encounter for screening mammogram for malignant neoplasm of breast 09/23/2020   Syrinx of spinal cord (HCC) 04/02/2020   Weakness 04/02/2020   Morbid obesity (HCC) 04/06/2019   Cough 03/26/2019   Acute non-recurrent pansinusitis 03/10/2019   Severe episode of recurrent major depressive disorder, without psychotic features (HCC) 03/10/2019   Generalized joint pain 03/10/2019   Primary insomnia 03/10/2019   Screening for malignant neoplasm of cervix 10/25/2018   Knee pain 10/23/2018   Triceps tendinitis 10/23/2018   Fracture of proximal phalanx of thumb 10/23/2018   Other constipation 08/11/2018   Family hx of colon cancer 08/11/2018   Episode of moderate major depression (HCC) 07/20/2018   Fatigue 07/20/2018   Fecal impaction (HCC)    Obstipation 06/17/2018   Acute retention of urine 06/17/2018   Constipation    Colitis 06/16/2018   Depression 06/16/2018   Hypokalemia 06/16/2018   Leukocytosis 06/16/2018   Acute upper respiratory infection 05/20/2018   Urinary tract infection with hematuria 05/20/2018   Dysuria 05/20/2018   Vaginal candida 05/20/2018   Bilateral lower extremity edema 05/20/2018   Other symptoms and signs involving the nervous system 05/20/2018    Generalized anxiety disorder 05/20/2018   Encounter for general adult medical examination with abnormal findings 05/20/2018   Family history of breast cancer in mother 05/20/2018   Osteoarthritis of knee 02/28/2017   Common migraine with intractable migraine 01/02/2017   White matter 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 01/02/2017   Postoperative wound cellulitis 05/08/2016   S/P cesarean section 04/24/2016   Status post primary low transverse cesarean section 04/24/2016   Oligohydramnios 04/19/2016   Supervision of pregnancy with history of pre-term labor in first trimester 10/31/2015    Past Medical History:  Diagnosis Date   Abnormal finding on MRI of brain 01/02/2017   Anxiety and depression    see progress notes -sw   Common migraine with intractable migraine 01/02/2017   Fibromyalgia    Polycystic ovarian disease    Psoriasis    Rosacea    Seizure (HCC)    as child from trauma; no meds and no further seizures    Family History  Problem Relation Age of Onset   Cancer Mother    Breast cancer Mother        early 67's   Bone cancer Mother    Migraines Mother    Brain cancer  Mother    Cancer Father    Colon cancer Father    Prostate cancer Father    Liver cancer Father    Migraines Sister    Brain cancer Maternal Grandmother    Migraines Maternal Grandmother    Breast cancer Maternal Grandmother        <50   Breast cancer Maternal Great-grandmother    Brain cancer Maternal Great-grandmother    Past Surgical History:  Procedure Laterality Date   CESAREAN SECTION N/A 04/24/2016   Procedure: CESAREAN SECTION;  Surgeon: Brock Bad, MD;  Location: [facility] BIRTHING SU[facility];  Service: Obstetrics;  Laterality: N/A;   CHOLECYSTECTOMY     TONSILLECTOMY     TONSILLECTOMY AND ADENOIDECTOMY     Social History   Social History Narrative   Lives at home w/ her children   Right-handed   Caffeine: 1 cup coffee per day   One story home   Immunization History  Administered Date(s)  Administered   Influenza Inj Mdck Quad Pf 12/18/2018, 09/05/2020   Influenza,inj,Quad PF,6+ Mos 10/31/2015, 10/15/2017   Influenza-Unspecified 08/23/2021   Moderna SARS-COV2 Booster Vaccination 08/23/2021   PFIZER(Purple Top)SARS-COV-2 Vaccination 07/03/2020, 07/24/2020   Tdap 04/27/2016     Objective: Vital Signs: BP 104/67 (BP Location: Left Arm, Patient Position: Sitting, Cuff Size: Normal)   Pulse 89   Resp 14   Ht 5\' 6"  (1.676 m)   Wt 174 lb (78.9 kg)   LMP 06/13/2023   BMI 28.08 kg/m    Physical Exam Eyes:     Conjunctiva/sclera: Conjunctivae normal.  Cardiovascular:     Rate and Rhythm: Normal rate and regular rhythm.  Pulmonary:     Effort: Pulmonary effort is normal.     Breath sounds: Normal breath sounds.  Lymphadenopathy:     Cervical: No cervical adenopathy.  Skin:    General: Skin is warm and dry.     Findings: Rash present.     Comments: Faintly erythematous patch at base of occiput, no overlying scale  Neurological:     Mental Status: She is alert.  Psychiatric:        Mood and Affect: Mood normal.      Musculoskeletal Exam:  Elbows full ROM no tenderness or swelling Wrists full ROM no tenderness or swelling Fingers full ROM throughout, heberdon's nodes right 2nd-3rd DIPs no synovitis Knees full ROM no tenderness or swelling Ankles full ROM no tenderness or swelling MTPs full ROM no tenderness or swelling  Investigation: No additional findings.  Imaging: No results found.  Recent Labs: Lab Results  Component Value Date   WBC 7.3 09/17/2022   HGB 14.0 09/17/2022   PLT 308 09/17/2022   NA 140 09/17/2022   K 3.8 09/17/2022   CL 108 09/17/2022   CO2 23 09/17/2022   GLUCOSE 83 09/17/2022   BUN 11 09/17/2022   CREATININE 0.73 09/17/2022   BILITOT 0.2 08/24/2021   ALKPHOS 97 08/24/2021   AST 21 08/24/2021   ALT 19 08/24/2021   PROT 6.3 08/24/2021   ALBUMIN 4.3 08/24/2021   CALCIUM 9.4 09/17/2022   GFRAA 112 03/14/2020    Speciality  Comments: No specialty comments available.  Procedures:  No procedures performed Allergies: Codeine, Hydrocodone, Metronidazole, and Morphine and codeine   Assessment / Plan:     Visit Diagnoses: Finger swelling - Plan: Sedimentation rate, C-reactive protein  Reporting she definitely sees increase in joint swelling and stiffness especially in the morning symptoms.  I do not appreciate any objective synovitis  changes on exam today.  Will check sed rate and CRP for inflammatory disease activity monitoring.  Discussed trial of adding oral DMARD medication for both symptoms and therapeutic trial.  If baseline labs okay would plan to start sulfasalazine increased to 1000 mg twice daily or as tolerated.  Recommend she continue the 30 mg Cymbalta for partial symptom benefit.  Other psoriasis  Skin rash appears minimally active but cannot exclude possibility of psoriatic arthritis as cause of seronegative inflammatory joint pain.  High risk medication use - Plan: Glucose 6 phosphate dehydrogenase  Discussed risk of trying sulfasalazine including GI intolerance, allergic reaction, cytopenias, or hepatotoxicity.  Checking G6PD enzyme function baseline screening for any high risk of side effects.  Orders: Orders Placed This Encounter  Procedures   Sedimentation rate   C-reactive protein   Glucose 6 phosphate dehydrogenase   No orders of the defined types were placed in this encounter.    Follow-Up Instructions: Return in about 8 years (around 06/16/2031) for OA/FMS/?PsA/?ANA SSZ trial f/u 2mos.   Fuller Plan, MD  Note - This record has been created using [employer].  Chart creation errors have been sought, but may not always  have been located. Such creation errors do not reflect on  the standard of medical care.

## 2023-06-16 ENCOUNTER — Encounter: Payer: Self-pay | Admitting: Internal Medicine

## 2023-06-16 ENCOUNTER — Ambulatory Visit: Payer: Medicaid Other | Attending: Internal Medicine | Admitting: Internal Medicine

## 2023-06-16 VITALS — BP 104/67 | HR 89 | Resp 14 | Ht 66.0 in | Wt 174.0 lb

## 2023-06-16 DIAGNOSIS — Z79899 Other long term (current) drug therapy: Secondary | ICD-10-CM

## 2023-06-16 DIAGNOSIS — R768 Other specified abnormal immunological findings in serum: Secondary | ICD-10-CM

## 2023-06-16 DIAGNOSIS — L408 Other psoriasis: Secondary | ICD-10-CM

## 2023-06-16 DIAGNOSIS — R7689 Other specified abnormal immunological findings in serum: Secondary | ICD-10-CM

## 2023-06-16 DIAGNOSIS — M7989 Other specified soft tissue disorders: Secondary | ICD-10-CM | POA: Diagnosis not present

## 2023-06-16 NOTE — Patient Instructions (Signed)
Sulfasalazine Tablets What is this medication? SULFASALAZINE (sul fa SAL a zeen) treats ulcerative colitis. It works by decreasing inflammation. It belongs to a group of medications called salicylates. This medicine may be used for other purposes; ask your health care provider or pharmacist if you have questions. COMMON BRAND NAME(S): Azulfidine, Sulfazine What should I tell my care team before I take this medication? They need to know if you have any of these conditions: Asthma Blood disorders or anemia Glucose-6-phosphate dehydrogenase (G6PD) deficiency Intestinal obstruction Kidney disease Liver disease Porphyria Urinary tract obstruction An unusual reaction to sulfasalazine, sulfa medications, salicylates, or other medications, foods, dyes, or preservatives Pregnant or trying to get pregnant Breast-feeding How should I use this medication? Take this medication by mouth with a full glass of water. Take it as directed on the prescription label at the same time every day. You can take it with or without food. If it upsets your stomach, take it with food. Keep taking it unless your care team tells you to stop. Talk to your care team about the use of this medication in children. While this medication may be prescribed for children as young as 6 years for selected conditions, precautions do apply. Patients over 36 years old may have a stronger reaction and need a smaller dose. Overdosage: If you think you have taken too much of this medicine contact a poison control center or emergency room at once. NOTE: This medicine is only for you. Do not share this medicine with others. What if I miss a dose? If you miss a dose, take it as soon as you can. If it is almost time for your next dose, take only that dose. Do not take double or extra doses. What may interact with this medication? Digoxin Folic acid This list may not describe all possible interactions. Give your health care provider a list  of all the medicines, herbs, non-prescription drugs, or dietary supplements you use. Also tell them if you smoke, drink alcohol, or use illegal drugs. Some items may interact with your medicine. What should I watch for while using this medication? Visit your care team for regular checks on your progress. Tell your care team if your symptoms do not start to get better or if they get worse. You will need frequent blood and urine checks. This medication can make you more sensitive to the sun. Keep out of the sun. If you cannot avoid being in the sun, wear protective clothing and use sunscreen. Do not use sun lamps or tanning beds/booths. Drink plenty of water while taking this medication. What side effects may I notice from receiving this medication? Side effects that you should report to your care team as soon as possible: Allergic reactions--skin rash, itching, hives, swelling of the face, lips, tongue, or throat Aplastic anemia--unusual weakness or fatigue, dizziness, headache, trouble breathing, increased bleeding or bruising Dry cough, shortness of breath or trouble breathing Heart muscle inflammation--unusual weakness or fatigue, shortness of breath, chest pain, fast or irregular heartbeat, dizziness, swelling of the ankles, feet, or hands Infection--fever, chills, cough, sore throat, wounds that don't heal, pain or trouble when passing urine, general feeling of discomfort or being unwell Kidney injury--decrease in the amount of urine, swelling of the ankles, hands, or feet Liver injury--right upper belly pain, loss of appetite, nausea, light-colored stool, dark yellow or brown urine, yellowing skin or eyes, unusual weakness or fatigue Rash, fever, and swollen lymph nodes Redness, blistering, peeling, or loosening of the skin, including inside  the mouth Side effects that usually do not require medical attention (report to your care team if they continue or are bothersome): Dark yellow or orange  saliva, sweat, or urine Dizziness Headache Loss of appetite Nausea Upset stomach Vomiting This list may not describe all possible side effects. Call your doctor for medical advice about side effects. You may report side effects to FDA at 1-800-FDA-1088. Where should I keep my medication? Keep out of the reach of children and pets. Store at room temperature between 15 and 30 degrees C (59 and 86 degrees F). Get rid of any unused medication after the expiration date. To get rid of medications that are no longer needed or have expired: Take the medications to a medication take-back program. Check with your pharmacy or law enforcement to find a location. If you cannot return the medication, check the label or package insert to see if the medication should be thrown out in the garbage or flushed down the toilet. If you are not sure, ask your care team. If it is safe to put it in the trash, take the medication out of the container. Mix the medication with cat litter, dirt, coffee grounds, or other unwanted substance. Seal the mixture in a bag or container. Put it in the trash. NOTE: This sheet is a summary. It may not cover all possible information. If you have questions about this medicine, talk to your doctor, pharmacist, or health care provider.  2024 Elsevier/Gold Standard (2021-10-11 00:00:00)

## 2023-06-21 LAB — SEDIMENTATION RATE: Sed Rate: 6 mm/h (ref 0–20)

## 2023-06-21 LAB — C-REACTIVE PROTEIN: CRP: <3 mg/L (ref <8.0)

## 2023-06-21 LAB — GLUCOSE 6 PHOSPHATE DEHYDROGENASE: G-6PDH: 12.7 U/g Hgb (ref 7.0–20.5)

## 2023-07-04 ENCOUNTER — Telehealth: Payer: Self-pay

## 2023-07-04 DIAGNOSIS — M130 Polyarthritis, unspecified: Secondary | ICD-10-CM

## 2023-07-04 DIAGNOSIS — L408 Other psoriasis: Secondary | ICD-10-CM

## 2023-07-04 MED ORDER — SULFASALAZINE 500 MG PO TABS
ORAL_TABLET | ORAL | 0 refills | Status: DC
Start: 2023-07-04 — End: 2023-08-18

## 2023-07-04 NOTE — Telephone Encounter (Signed)
Patient contacted the office and states Dr. Dimple Casey was going to start her on another medication. Patient states she had to have labs done first and then someone was going to contact her the following week. Patient states she had labs done on 06/16/2023. Patient states she has not heard anything. Patient believes she is supposed to start sulfasalazine. Patient call back number is [phone]. Please advise.

## 2023-07-04 NOTE — Telephone Encounter (Signed)
Patient advised Her lab result is okay for starting the sulfasalazine as planned. Her inflammatory markers are normal but these are not always reliable for measuring psoriasis activity.   Dr. Dimple Casey sent the prescription today to start with 1 tablets twice daily and can go up to 2 tablets twice daily if tolerating. Patient verbalized understanding.

## 2023-07-04 NOTE — Addendum Note (Signed)
Addended by: Fuller Plan on: 07/04/2023 04:25 PM   Modules accepted: Orders

## 2023-07-04 NOTE — Telephone Encounter (Signed)
Her lab result is okay for starting the sulfasalazine as planned. Her inflammatory markers are normal but these are not always reliable for measuring psoriasis activity.  I sent the prescription today to start with 1 tablets twice daily and can go up to 2 tablets twice daily if tolerating.

## 2023-07-07 ENCOUNTER — Other Ambulatory Visit: Payer: Self-pay | Admitting: *Deleted

## 2023-07-07 DIAGNOSIS — M2550 Pain in unspecified joint: Secondary | ICD-10-CM

## 2023-07-07 MED ORDER — DULOXETINE HCL 30 MG PO CPEP
30.0000 mg | ORAL_CAPSULE | Freq: Every day | ORAL | 1 refills | Status: DC
Start: 2023-07-07 — End: 2023-09-09

## 2023-07-07 NOTE — Telephone Encounter (Signed)
Patient requested refill on Cymbalta.   Last Fill: 04/16/2023  Next Visit: 08/13/2023  Last Visit: 06/16/2023  Dx: Finger swelling   Current Dose per office note on 06/16/2023: continue the 30 mg Cymbalta for partial symptom benefit.   Okay to refill Cymbalta?

## 2023-07-19 ENCOUNTER — Other Ambulatory Visit: Payer: Self-pay | Admitting: Nurse Practitioner

## 2023-07-19 DIAGNOSIS — G8929 Other chronic pain: Secondary | ICD-10-CM

## 2023-08-13 ENCOUNTER — Ambulatory Visit: Payer: Medicaid Other | Admitting: Internal Medicine

## 2023-08-16 ENCOUNTER — Other Ambulatory Visit: Payer: Self-pay | Admitting: Internal Medicine

## 2023-08-16 DIAGNOSIS — M130 Polyarthritis, unspecified: Secondary | ICD-10-CM

## 2023-08-18 NOTE — Telephone Encounter (Signed)
Okay to refill we will send new prescription with plan to follow-up with labs next month. Usually we would expect to see a maximum response to a specific dose of sulfasalazine by about 3 months with that treatment.  So she may see additional benefit over time on the current dose.  I usually do not go significantly above 1000 mg twice daily as a maintenance dose.

## 2023-08-18 NOTE — Telephone Encounter (Signed)
Last Fill: 07/04/2023  Labs: 01/21/2023 normal  Next Visit: 09/24/2023  Last Visit: 06/16/2023  DX: Finger swelling   Current Dose per office note 06/16/2023: If baseline labs okay would plan to start sulfasalazine increased to 1000 mg twice daily or as tolerated.   Contacted the patient and advised her labs were well overdue. Patient inquires if she can just do them at her next visit. Advised the patient I wanted to let her know and that I would still send the prescription request to Dr. Dimple Casey because she has just started the medication.   Patient states she is tolerating the two pills twice a day. Patient states she has had some nausea and indigestion, but not severely enough to make her stop her medication. Patient would like to know if this is a medication where the dose needs to be increased or if it is a medication where you will see more results over time. Patient states she has noticed her swelling go down a little bit.   I changed the starter prescription to reflect two tablets twice daily, please change if needed.   Okay to refill Sulfasalazine?

## 2023-08-19 NOTE — Telephone Encounter (Signed)
Patient advised Okay to refill we will send new prescription with plan to follow-up with labs next month. Usually we would expect to see a maximum response to a specific dose of sulfasalazine by about 3 months with that treatment.  So she may see additional benefit over time on the current dose.  Dr. Dimple Casey usually does not go significantly above 1000 mg twice daily as a maintenance dose. Patient verbalized understanding.

## 2023-09-09 ENCOUNTER — Other Ambulatory Visit: Payer: Self-pay | Admitting: *Deleted

## 2023-09-09 DIAGNOSIS — G8929 Other chronic pain: Secondary | ICD-10-CM

## 2023-09-09 DIAGNOSIS — M2550 Pain in unspecified joint: Secondary | ICD-10-CM

## 2023-09-09 MED ORDER — DULOXETINE HCL 30 MG PO CPEP: 30.0000 mg | ORAL_CAPSULE | Freq: Every day | ORAL | 2 refills | Status: DC

## 2023-09-09 MED ORDER — CLOBETASOL PROPIONATE 0.05 % EX SOLN: 1.0000 | Freq: Two times a day (BID) | CUTANEOUS | 0 refills | Status: DC | PRN

## 2023-09-09 MED ORDER — MELOXICAM 15 MG PO TABS: 15.0000 mg | ORAL_TABLET | Freq: Every day | ORAL | 2 refills | Status: DC

## 2023-09-09 NOTE — Telephone Encounter (Signed)
Patient contacted the office requesting a refill on Cymbalta and Meloxicam. Patient states she is also having trouble with Psoriasis in  her scalp. Patient is requesting a prescription for Clobetasol solution to help with that. Please advise.  Last Fill: 07/07/2023 (Cymbalta)  Labs: no recent CBC and CMP on file  Next Visit: 09/24/2023  Last Visit: 06/16/2023  DX: Finger swelling   Current Dose per office note 06/16/2023: Recommend she continue the 30 mg Cymbalta. Meloxicam not mentioned.  Okay to refill Meloxicam and Cymbalta?

## 2023-09-10 NOTE — Progress Notes (Signed)
Office Visit Note  Patient: Andrea [name]             Date of Birth: November 27, 1981           MRN: [id number]             PCP: Shelby Dubin, FNP Referring: Shelby Dubin, FNP Visit Date: 09/24/2023   Subjective:  Follow-up (Patient states she believes the sulfasalazine has made a positive difference. )   History of Present Illness: Andrea [name] is a 42 y.o. female here for follow up for psoriatic arthritis as well as chronic pain associated with osteoarthritis and fibromyalgia syndrome.  She feels the sulfasalazine has bays noticeable difference in her symptoms.  He still has pain on a daily basis but is not experiencing the severe stiffness difficulty moving her joints and long duration every morning before being able to move.  She has not experienced any significant side effects from the medication.  Still taking the meloxicam which is helpful.  Skin rashes are doing well in most places keep some persistent itching especially at the back of her scalp.  Previous HPI 06/16/2023 Andrea [name] is a 42 y.o. female here for follow up for ongoing joint pain and stiffness of multiple areas with osteoarthritis fibromyalgia syndrome and possible psoriatic disease inflammation.  Resuming the Cymbalta at 30 mg daily since previous visit feels like this is partially helpful but still only making a small impact in her overall joint pain and stiffness and severe fatigue.  She is also concerned about seeing more pronounced swelling in her fingers especially early morning.   Previous HPI 04/16/23 Andrea [name] is a 42 y.o. female here for follow up for joint pain in multiple areas with positive ANA and psoriasis.  Antibody testing at initial visit was entirely unremarkable.  X-ray of bilateral hands did not show significant extent of degenerative arthritis.  She still has joint pain and stiffness and intermittent swelling ongoing as before.  Currently taking meloxicam and some Tylenol arthritis in the  mornings which alleviates symptoms she notices a lot more swelling and stiffness without it.  Has rashes most often at the back of the neck and sometimes occurring on her elbows and inside the ears which she treats with lotion during active symptoms.   Previous HPI 03/18/23 Andrea [name] is a 42 y.o. female here for evaluation of joint pain in multiple areas worsening for about the past 6 or 7 months.  She has a longstanding history of fatigue body aches and migraine headaches.  Initially developed severe migraines with some nonspecific MRI white matter changes identified at age 79.  She was found to have fibromyalgia syndrome characterized by pain and sensitivity that is been usually persistent with some varying intensity.  Fatigue has been a problem worse for about the past 5 to 6 years is not associated with any specific change in sleep pattern or other focal symptoms that she can recall.  Current issue with increased pain since around August of last year with a much more acute or severe painful sensation in multiple areas throughout her arms and legs gets a feeling like there is swelling or joint trying to come out of its socket.  There is not as much pain and sensitivity to touch versus weightbearing and range of motion.  She mostly does not notice a lot of visible joint swelling erythema or warmth occasionally notices swelling or the tendons standing out on the backs of her hands.  She has noticed  new joint nodules developing on multiple finger joints.  She experiences numbness and tingling sensations in the tips of the fingers and the toes this is coming and going.  Not associated with particular time of day activity or position.  Right great toe numbness is more persistent.  Of note she did discontinue several medications that she felt were not improving symptoms much.  Also had some unintentional weight loss up to 70 pounds throughout last year earlier this year.    Review of Systems   Constitutional:  Positive for fatigue.  HENT:  Negative for mouth sores and mouth dryness.   Eyes:  Positive for dryness.  Respiratory:  Negative for shortness of breath.   Cardiovascular:  Negative for chest pain and palpitations.  Gastrointestinal:  Negative for blood in stool, constipation and diarrhea.  Endocrine: Positive for increased urination.  Genitourinary:  Negative for involuntary urination.  Musculoskeletal:  Positive for joint pain, gait problem, joint pain, joint swelling, myalgias, muscle weakness, morning stiffness and myalgias. Negative for muscle tenderness.  Skin:  Positive for rash and hair loss. Negative for color change and sensitivity to sunlight.  Allergic/Immunologic: Positive for susceptible to infections.  Neurological:  Negative for dizziness and headaches.  Hematological:  Negative for swollen glands.  Psychiatric/Behavioral:  Negative for depressed mood and sleep disturbance. The patient is not nervous/anxious.     PMFS History:  Patient Active Problem List   Diagnosis Date Noted   Psoriatic arthritis (HCC) 09/24/2023   High risk medication use 06/16/2023   Finger swelling 04/16/2023   Positive ANA (antinuclear antibody) 03/18/2023   Other psoriasis 03/18/2023   Cervical high risk human papillomavirus (HPV) DNA test positive 06/01/2021   Chronic bilateral low back pain with bilateral sciatica 10/29/2020   Migraine without aura and without status migrainosus, not intractable 09/23/2020   BMI 34.0-34.9,adult 09/23/2020   Needs flu shot 09/23/2020   Encounter for screening mammogram for malignant neoplasm of breast 09/23/2020   Syrinx of spinal cord (HCC) 04/02/2020   Weakness 04/02/2020   Morbid obesity (HCC) 04/06/2019   Cough 03/26/2019   Acute non-recurrent pansinusitis 03/10/2019   Severe episode of recurrent major depressive disorder, without psychotic features (HCC) 03/10/2019   Generalized joint pain 03/10/2019   Primary insomnia 03/10/2019    Screening for malignant neoplasm of cervix 10/25/2018   Knee pain 10/23/2018   Triceps tendinitis 10/23/2018   Fracture of proximal phalanx of thumb 10/23/2018   Other constipation 08/11/2018   Family hx of colon cancer 08/11/2018   Episode of moderate major depression (HCC) 07/20/2018   Fatigue 07/20/2018   Fecal impaction (HCC)    Obstipation 06/17/2018   Acute retention of urine 06/17/2018   Constipation    Colitis 06/16/2018   Depression 06/16/2018   Hypokalemia 06/16/2018   Leukocytosis 06/16/2018   Acute upper respiratory infection 05/20/2018   Urinary tract infection with hematuria 05/20/2018   Dysuria 05/20/2018   Vaginal candida 05/20/2018   Bilateral lower extremity edema 05/20/2018   Other symptoms and signs involving the nervous system 05/20/2018   Generalized anxiety disorder 05/20/2018   Encounter for general adult medical examination with abnormal findings 05/20/2018   Family history of breast cancer in mother 05/20/2018   Osteoarthritis of knee 02/28/2017   Common migraine with intractable migraine 01/02/2017   White matter 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 01/02/2017   Postoperative wound cellulitis 05/08/2016   S/P cesarean section 04/24/2016   Status post primary low transverse cesarean section 04/24/2016   Oligohydramnios 04/19/2016  Supervision of pregnancy with history of pre-term labor in first trimester 10/31/2015    Past Medical History:  Diagnosis Date   Abnormal finding on MRI of brain 01/02/2017   Anxiety and depression    see progress notes -sw   Common migraine with intractable migraine 01/02/2017   Fibromyalgia    Polycystic ovarian disease    Psoriasis    Rosacea    Seizure (HCC)    as child from trauma; no meds and no further seizures    Family History  Problem Relation Age of Onset   Cancer Mother    Breast cancer Mother        early 85's   Bone cancer Mother    Migraines Mother    Brain cancer Mother    Cancer Father    Colon  cancer Father    Prostate cancer Father    Liver cancer Father    Migraines Sister    Brain cancer Maternal Grandmother    Migraines Maternal Grandmother    Breast cancer Maternal Grandmother        <50   Breast cancer Maternal Great-grandmother    Brain cancer Maternal Great-grandmother    Past Surgical History:  Procedure Laterality Date   CESAREAN SECTION N/A 04/24/2016   Procedure: CESAREAN SECTION;  Surgeon: Brock Bad, MD;  Location: [facility]TH[facility] SUIT[facility];  Service: Obstetrics;  Laterality: N/A;   CHOLECYSTECTOMY     TONSILLECTOMY     TONSILLECTOMY AND ADENOIDECTOMY     Social History   Social History Narrative   Lives at home w/ her children   Right-handed   Caffeine: 1 cup coffee per day   One story home   Immunization History  Administered Date(s) Administered   Influenza Inj Mdck Quad Pf 12/18/2018, 09/05/2020   Influenza,inj,Quad PF,6+ Mos 10/31/2015, 10/15/2017   Influenza-Unspecified 08/23/2021   Moderna SARS-COV2 Booster Vaccination 08/23/2021   PFIZER(Purple Top)SARS-COV-2 Vaccination 07/03/2020, 07/24/2020   Tdap 04/27/2016     Objective: Vital Signs: BP 109/74 (BP Location: Left Arm, Patient Position: Sitting, Cuff Size: Normal)   Pulse 73   Resp 12   Ht 5\' 6"  (1.676 m)   Wt 180 lb (81.6 kg)   LMP 09/17/2023   BMI 29.05 kg/m    Physical Exam Eyes:     Conjunctiva/sclera: Conjunctivae normal.  Cardiovascular:     Rate and Rhythm: Normal rate and regular rhythm.  Pulmonary:     Effort: Pulmonary effort is normal.     Breath sounds: Normal breath sounds.  Lymphadenopathy:     Cervical: No cervical adenopathy.  Skin:    General: Skin is warm and dry.     Findings: Rash present.     Comments:  Faintly erythematous patch at base of occiput, no overlying scale   Neurological:     Mental Status: She is alert.  Psychiatric:        Mood and Affect: Mood normal.      Musculoskeletal Exam: Elbows full ROM no tenderness or swelling Wrists  full ROM no tenderness or swelling Fingers full ROM throughout, heberdon's nodes right 2nd-3rd DIPs no synovitis Knees full ROM no tenderness or swelling Ankles full ROM no tenderness or swelling  Investigation: No additional findings.  Imaging: No results found.  Recent Labs: Lab Results  Component Value Date   WBC 6.5 09/24/2023   HGB 13.4 09/24/2023   PLT 235 09/24/2023   NA 140 09/24/2023   K 4.2 09/24/2023   CL 102 09/24/2023  CO2 28 09/24/2023   GLUCOSE 73 09/24/2023   BUN 11 09/24/2023   CREATININE 0.80 09/24/2023   BILITOT 0.3 09/24/2023   ALKPHOS 97 08/24/2021   AST 16 09/24/2023   ALT 15 09/24/2023   PROT 6.3 09/24/2023   ALBUMIN 4.3 08/24/2021   CALCIUM 9.5 09/24/2023   GFRAA 112 03/14/2020    Speciality Comments: No specialty comments available.  Procedures:  No procedures performed Allergies: Codeine, Hydrocodone, Metronidazole, and Morphine and codeine   Assessment / Plan:     Visit Diagnoses: Psoriatic arthritis (HCC) - Plan: Sedimentation rate  Inflammatory arthritis symptoms improved particularly the severe morning stiffness.  No peripheral synovitis appreciable on exam today but she never had much objective change.  Rechecking sedimentation rate for disease activity monitoring.  Plan to continue on sulfasalazine 1000 mg twice daily.  Can continue meloxicam 15 mg daily.  Other psoriasis - Plan: clobetasol (TEMOVATE) 0.05 % external solution  No active skin psoriasis visible except mild inflammation on the scalp.  Prescribed topical clobetasol solution for use on the affected area as needed.  High risk medication use - Plan: CBC with Differential/Platelet, COMPLETE METABOLIC PANEL WITH GFR  Checking CBC and CMP for medication monitoring on sulfasalazine dose after titration to 1000 mg twice daily.  Not reporting any intolerance to medicine.  No serious interval infections.  Polyarthritis Generalized joint pain  Some widespread joint and  myofascial pain.  Consistent with fibromyalgia syndrome.  She has partial symptom improvement with the Cymbalta 30 mg daily.  Orders: Orders Placed This Encounter  Procedures   Sedimentation rate   CBC with Differential/Platelet   COMPLETE METABOLIC PANEL WITH GFR   Meds ordered this encounter  Medications   clobetasol (TEMOVATE) 0.05 % external solution    Sig: Apply 1 Application topically 2 (two) times daily as needed.    Dispense:  50 mL    Refill:  0   sulfaSALAzine (AZULFIDINE) 500 MG tablet    Sig: Take 2 tablets (1,000 mg total) by mouth 2 (two) times daily.    Dispense:  360 tablet    Refill:  0     Follow-Up Instructions: Return in about 3 months (around 12/25/2023) for PsA on SSZ/NSAID/clobetasol f/u 3mos.   Fuller Plan, MD  Note - This record has been created using [employer].  Chart creation errors have been sought, but may not always  have been located. Such creation errors do not reflect on  the standard of medical care.

## 2023-09-24 ENCOUNTER — Encounter: Payer: Self-pay | Admitting: Internal Medicine

## 2023-09-24 ENCOUNTER — Ambulatory Visit: Payer: Medicaid Other | Attending: Internal Medicine | Admitting: Internal Medicine

## 2023-09-24 VITALS — BP 109/74 | HR 73 | Resp 12 | Ht 66.0 in | Wt 180.0 lb

## 2023-09-24 DIAGNOSIS — M7989 Other specified soft tissue disorders: Secondary | ICD-10-CM | POA: Diagnosis not present

## 2023-09-24 DIAGNOSIS — Z79899 Other long term (current) drug therapy: Secondary | ICD-10-CM | POA: Diagnosis not present

## 2023-09-24 DIAGNOSIS — L4050 Arthropathic psoriasis, unspecified: Secondary | ICD-10-CM | POA: Diagnosis not present

## 2023-09-24 DIAGNOSIS — M2550 Pain in unspecified joint: Secondary | ICD-10-CM

## 2023-09-24 DIAGNOSIS — M130 Polyarthritis, unspecified: Secondary | ICD-10-CM

## 2023-09-24 DIAGNOSIS — L408 Other psoriasis: Secondary | ICD-10-CM

## 2023-09-24 MED ORDER — CLOBETASOL PROPIONATE 0.05 % EX SOLN: 1.0000 | Freq: Two times a day (BID) | CUTANEOUS | 0 refills | Status: AC | PRN

## 2023-09-24 MED ORDER — SULFASALAZINE 500 MG PO TABS
1000.0000 mg | ORAL_TABLET | Freq: Two times a day (BID) | ORAL | 0 refills | Status: DC
Start: 2023-09-24 — End: 2024-01-29

## 2023-09-25 LAB — COMPLETE METABOLIC PANEL WITH GFR
AG Ratio: 2.2 (calc) (ref 1.0–2.5)
ALT: 15 U/L (ref 6–29)
AST: 16 U/L (ref 10–30)
Albumin: 4.3 g/dL (ref 3.6–5.1)
Alkaline phosphatase (APISO): 60 U/L (ref 31–125)
BUN: 11 mg/dL (ref 7–25)
CO2: 28 mmol/L (ref 20–32)
Calcium: 9.5 mg/dL (ref 8.6–10.2)
Chloride: 102 mmol/L (ref 98–110)
Creat: 0.8 mg/dL (ref 0.50–0.99)
Globulin: 2 g/dL (ref 1.9–3.7)
Glucose, Bld: 73 mg/dL (ref 65–99)
Potassium: 4.2 mmol/L (ref 3.5–5.3)
Sodium: 140 mmol/L (ref 135–146)
Total Bilirubin: 0.3 mg/dL (ref 0.2–1.2)
Total Protein: 6.3 g/dL (ref 6.1–8.1)
eGFR: 95 mL/min/{1.73_m2} (ref >=60)

## 2023-09-25 LAB — CBC WITH DIFFERENTIAL/PLATELET
Absolute Lymphocytes: 2613 {cells}/uL (ref 850–3900)
Absolute Monocytes: 390 {cells}/uL (ref 200–950)
Basophils Absolute: 20 {cells}/uL (ref 0–200)
Basophils Relative: 0.3 %
Eosinophils Absolute: 130 {cells}/uL (ref 15–500)
Eosinophils Relative: 2 %
HCT: 42.1 % (ref 35.0–45.0)
Hemoglobin: 13.4 g/dL (ref 11.7–15.5)
MCH: 30 pg (ref 27.0–33.0)
MCHC: 31.8 g/dL — ABNORMAL LOW (ref 32.0–36.0)
MCV: 94.2 fL (ref 80.0–100.0)
MPV: 9.6 fL (ref 7.5–12.5)
Monocytes Relative: 6 %
Neutro Abs: 3348 {cells}/uL (ref 1500–7800)
Neutrophils Relative %: 51.5 %
Platelets: 235 10*3/uL (ref 140–400)
RBC: 4.47 10*6/uL (ref 3.80–5.10)
RDW: 12.2 % (ref 11.0–15.0)
Total Lymphocyte: 40.2 %
WBC: 6.5 10*3/uL (ref 3.8–10.8)

## 2023-09-25 LAB — SEDIMENTATION RATE: Sed Rate: 2 mm/h (ref 0–20)

## 2023-10-15 ENCOUNTER — Other Ambulatory Visit: Payer: Self-pay | Admitting: Internal Medicine

## 2023-10-15 DIAGNOSIS — M130 Polyarthritis, unspecified: Secondary | ICD-10-CM

## 2023-12-04 ENCOUNTER — Other Ambulatory Visit: Payer: Self-pay | Admitting: Internal Medicine

## 2023-12-04 DIAGNOSIS — G8929 Other chronic pain: Secondary | ICD-10-CM

## 2023-12-04 NOTE — Telephone Encounter (Signed)
 Last Fill: 09/09/2023  Labs: 09/24/2023 Sed rate of 2 is normal.  Blood count is normal.  Kidney and liver function are normal.  No problem for continuing the sulfasalazine  and meloxicam .   Next Visit: 12/25/2023  Last Visit: 09/24/2023  DX: Psoriatic arthritis   Current Dose per office note 09/24/2023: meloxicam  15 mg daily.   Okay to refill Meloxicam ?

## 2023-12-05 ENCOUNTER — Other Ambulatory Visit: Payer: Self-pay | Admitting: Internal Medicine

## 2023-12-05 DIAGNOSIS — M2550 Pain in unspecified joint: Secondary | ICD-10-CM

## 2023-12-05 NOTE — Telephone Encounter (Signed)
 Last Fill: 09/09/2023  Next Visit: 12/25/2023  Last Visit: 09/24/2023  Dx: Polyarthritis Generalized joint pain  Current Dose per office note on 09/24/2023: Cymbalta 30 mg daily   Okay to refill Cymbalta?

## 2023-12-11 NOTE — Progress Notes (Deleted)
 Office Visit Note  Patient: Andrea [name]             Date of Birth: 06-15-1981           MRN: [id number]             PCP: Shelby Dubin, FNP Referring: Shelby Dubin, FNP Visit Date: 12/25/2023   Subjective:  No chief complaint on file.   History of Present Illness: Andrea [name] is a 43 y.o. female here for follow up for psoriatic arthritis as well as chronic pain associated with osteoarthritis and fibromyalgia syndrome.    Previous HPI 09/24/2023 Andrea [name] is a 43 y.o. female here for follow up for psoriatic arthritis as well as chronic pain associated with osteoarthritis and fibromyalgia syndrome.  She feels the sulfasalazine has bays noticeable difference in her symptoms.  He still has pain on a daily basis but is not experiencing the severe stiffness difficulty moving her joints and long duration every morning before being able to move.  She has not experienced any significant side effects from the medication.  Still taking the meloxicam which is helpful.  Skin rashes are doing well in most places keep some persistent itching especially at the back of her scalp.   Previous HPI 06/16/2023 Andrea [name] is a 42 y.o. female here for follow up for ongoing joint pain and stiffness of multiple areas with osteoarthritis fibromyalgia syndrome and possible psoriatic disease inflammation.  Resuming the Cymbalta at 30 mg daily since previous visit feels like this is partially helpful but still only making a small impact in her overall joint pain and stiffness and severe fatigue.  She is also concerned about seeing more pronounced swelling in her fingers especially early morning.   Previous HPI 04/16/23 Andrea [name] is a 42 y.o. female here for follow up for joint pain in multiple areas with positive ANA and psoriasis.  Antibody testing at initial visit was entirely unremarkable.  X-ray of bilateral hands did not show significant extent of degenerative arthritis.  She still has joint  pain and stiffness and intermittent swelling ongoing as before.  Currently taking meloxicam and some Tylenol arthritis in the mornings which alleviates symptoms she notices a lot more swelling and stiffness without it.  Has rashes most often at the back of the neck and sometimes occurring on her elbows and inside the ears which she treats with lotion during active symptoms.   Previous HPI 03/18/23 Andrea [name] is a 43 y.o. female here for evaluation of joint pain in multiple areas worsening for about the past 6 or 7 months.  She has a longstanding history of fatigue body aches and migraine headaches.  Initially developed severe migraines with some nonspecific MRI white matter changes identified at age 22.  She was found to have fibromyalgia syndrome characterized by pain and sensitivity that is been usually persistent with some varying intensity.  Fatigue has been a problem worse for about the past 5 to 6 years is not associated with any specific change in sleep pattern or other focal symptoms that she can recall.  Current issue with increased pain since around August of last year with a much more acute or severe painful sensation in multiple areas throughout her arms and legs gets a feeling like there is swelling or joint trying to come out of its socket.  There is not as much pain and sensitivity to touch versus weightbearing and range of motion.  She mostly does not notice a lot of  visible joint swelling erythema or warmth occasionally notices swelling or the tendons standing out on the backs of her hands.  She has noticed new joint nodules developing on multiple finger joints.  She experiences numbness and tingling sensations in the tips of the fingers and the toes this is coming and going.  Not associated with particular time of day activity or position.  Right great toe numbness is more persistent.  Of note she did discontinue several medications that she felt were not improving symptoms much.  Also had  some unintentional weight loss up to 70 pounds throughout last year earlier this year.    No Rheumatology ROS completed.   PMFS History:  Patient Active Problem List   Diagnosis Date Noted   Psoriatic arthritis (HCC) 09/24/2023   High risk medication use 06/16/2023   Finger swelling 04/16/2023   Positive ANA (antinuclear antibody) 03/18/2023   Other psoriasis 03/18/2023   Cervical high risk human papillomavirus (HPV) DNA test positive 06/01/2021   Chronic bilateral low back pain with bilateral sciatica 10/29/2020   Migraine without aura and without status migrainosus, not intractable 09/23/2020   BMI 34.0-34.9,adult 09/23/2020   Needs flu shot 09/23/2020   Encounter for screening mammogram for malignant neoplasm of breast 09/23/2020   Syrinx of spinal cord (HCC) 04/02/2020   Weakness 04/02/2020   Morbid obesity (HCC) 04/06/2019   Cough 03/26/2019   Acute non-recurrent pansinusitis 03/10/2019   Severe episode of recurrent major depressive disorder, without psychotic features (HCC) 03/10/2019   Generalized joint pain 03/10/2019   Primary insomnia 03/10/2019   Screening for malignant neoplasm of cervix 10/25/2018   Knee pain 10/23/2018   Triceps tendinitis 10/23/2018   Fracture of proximal phalanx of thumb 10/23/2018   Other constipation 08/11/2018   Family hx of colon cancer 08/11/2018   Episode of moderate major depression (HCC) 07/20/2018   Fatigue 07/20/2018   Fecal impaction (HCC)    Obstipation 06/17/2018   Acute retention of urine 06/17/2018   Constipation    Colitis 06/16/2018   Depression 06/16/2018   Hypokalemia 06/16/2018   Leukocytosis 06/16/2018   Acute upper respiratory infection 05/20/2018   Urinary tract infection with hematuria 05/20/2018   Dysuria 05/20/2018   Vaginal candida 05/20/2018   Bilateral lower extremity edema 05/20/2018   Other symptoms and signs involving the nervous system 05/20/2018   Generalized anxiety disorder 05/20/2018   Encounter  for general adult medical examination with abnormal findings 05/20/2018   Family history of breast cancer in mother 05/20/2018   Osteoarthritis of knee 02/28/2017   Common migraine with intractable migraine 01/02/2017   White matter 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 01/02/2017   Postoperative wound cellulitis 05/08/2016   S/P cesarean section 04/24/2016   Status post primary low transverse cesarean section 04/24/2016   Oligohydramnios 04/19/2016   Supervision of pregnancy with history of pre-term labor in first trimester 10/31/2015    Past Medical History:  Diagnosis Date   Abnormal finding on MRI of brain 01/02/2017   Anxiety and depression    see progress notes -sw   Common migraine with intractable migraine 01/02/2017   Fibromyalgia    Polycystic ovarian disease    Psoriasis    Rosacea    Seizure (HCC)    as child from trauma; no meds and no further seizures    Family History  Problem Relation Age of Onset   Cancer Mother    Breast cancer Mother        early 28's   Bone  cancer Mother    Migraines Mother    Brain cancer Mother    Cancer Father    Colon cancer Father    Prostate cancer Father    Liver cancer Father    Migraines Sister    Brain cancer Maternal Grandmother    Migraines Maternal Grandmother    Breast cancer Maternal Grandmother        <50   Breast cancer Maternal Great-grandmother    Brain cancer Maternal Great-grandmother    Past Surgical History:  Procedure Laterality Date   CESAREAN SECTION N/A 04/24/2016   Procedure: CESAREAN SECTION;  Surgeon: Brock Bad, MD;  Location: [facility]IRTHING SUIT[facility];  Service: Obstetrics;  Laterality: N/A;   CHOLECYSTECTOMY     TONSILLECTOMY     TONSILLECTOMY AND ADENOIDECTOMY     Social History   Social History Narrative   Lives at home w/ her children   Right-handed   Caffeine: 1 cup coffee per day   One story home   Immunization History  Administered Date(s) Administered   Influenza Inj Mdck Quad Pf 12/18/2018,  09/05/2020   Influenza,inj,Quad PF,6+ Mos 10/31/2015, 10/15/2017   Influenza-Unspecified 08/23/2021   Moderna SARS-COV2 Booster Vaccination 08/23/2021   PFIZER(Purple Top)SARS-COV-2 Vaccination 07/03/2020, 07/24/2020   Tdap 04/27/2016     Objective: Vital Signs: There were no vitals taken for this visit.   Physical Exam   Musculoskeletal Exam: ***  CDAI Exam: CDAI Score: -- Patient Global: --; Provider Global: -- Swollen: --; Tender: -- Joint Exam 12/25/2023   No joint exam has been documented for this visit   There is currently no information documented on the homunculus. Go to the Rheumatology activity and complete the homunculus joint exam.  Investigation: No additional findings.  Imaging: No results found.  Recent Labs: Lab Results  Component Value Date   WBC 6.5 09/24/2023   HGB 13.4 09/24/2023   PLT 235 09/24/2023   NA 140 09/24/2023   K 4.2 09/24/2023   CL 102 09/24/2023   CO2 28 09/24/2023   GLUCOSE 73 09/24/2023   BUN 11 09/24/2023   CREATININE 0.80 09/24/2023   BILITOT 0.3 09/24/2023   ALKPHOS 97 08/24/2021   AST 16 09/24/2023   ALT 15 09/24/2023   PROT 6.3 09/24/2023   ALBUMIN 4.3 08/24/2021   CALCIUM 9.5 09/24/2023   GFRAA 112 03/14/2020    Speciality Comments: No specialty comments available.  Procedures:  No procedures performed Allergies: Codeine, Hydrocodone, Metronidazole, and Morphine and codeine   Assessment / Plan:     Visit Diagnoses: No diagnosis found.  ***  Orders: No orders of the defined types were placed in this encounter.  No orders of the defined types were placed in this encounter.    Follow-Up Instructions: No follow-ups on file.   Metta Clines, RT  Note - This record has been created using [employer].  Chart creation errors have been sought, but may not always  have been located. Such creation errors do not reflect on  the standard of medical care.

## 2023-12-18 ENCOUNTER — Telehealth: Payer: Self-pay

## 2023-12-18 NOTE — Telephone Encounter (Signed)
Attempted to return call, message to voicemail.  Increasing cymbalta will be an option. She had been on 60 mg daily in the past before 2023. She could increase to taking 2 of her current pills daily and monitor if this helps and is tolerable by the time we are following up next week.

## 2023-12-18 NOTE — Telephone Encounter (Signed)
I called patient, patient verbalized understanding. 

## 2023-12-18 NOTE — Telephone Encounter (Signed)
Patient contacted the office and states she wants to be seen sooner, tomorrow if possible. Advised the patient there is no sooner available appointment before her scheduled appointment next week. Patient states the last two days have been horrible. Patient states she has been in bed crying with whole body intensified pain. Patient states she has taken Advil, ibuprofen, and tylenol and nothing will help. Patient inquired if increasing the Cymbalta would help her or if it was possible. Patient is not sure what to do. Please advise.

## 2023-12-25 ENCOUNTER — Ambulatory Visit: Payer: Medicaid Other | Admitting: Internal Medicine

## 2023-12-25 DIAGNOSIS — L4050 Arthropathic psoriasis, unspecified: Secondary | ICD-10-CM

## 2023-12-25 DIAGNOSIS — M2550 Pain in unspecified joint: Secondary | ICD-10-CM

## 2023-12-25 DIAGNOSIS — L408 Other psoriasis: Secondary | ICD-10-CM

## 2023-12-25 DIAGNOSIS — M130 Polyarthritis, unspecified: Secondary | ICD-10-CM

## 2023-12-25 DIAGNOSIS — Z79899 Other long term (current) drug therapy: Secondary | ICD-10-CM

## 2024-01-02 ENCOUNTER — Encounter: Payer: Self-pay | Admitting: Internal Medicine

## 2024-01-02 ENCOUNTER — Ambulatory Visit: Payer: Medicaid Other | Attending: Internal Medicine | Admitting: Internal Medicine

## 2024-01-02 VITALS — BP 115/74 | HR 80 | Resp 14 | Ht 67.0 in | Wt 177.0 lb

## 2024-01-02 DIAGNOSIS — Z79899 Other long term (current) drug therapy: Secondary | ICD-10-CM | POA: Diagnosis not present

## 2024-01-02 DIAGNOSIS — M130 Polyarthritis, unspecified: Secondary | ICD-10-CM | POA: Diagnosis not present

## 2024-01-02 DIAGNOSIS — L4050 Arthropathic psoriasis, unspecified: Secondary | ICD-10-CM | POA: Diagnosis not present

## 2024-01-02 NOTE — Progress Notes (Signed)
Office Visit Note  Patient: Andrea [name]             Date of Birth: 1981-01-08           MRN: [id number]             PCP: Shelby Dubin, FNP Referring: Shelby Dubin, FNP Visit Date: 01/02/2024   Subjective:  Follow-up   Discussed the use of AI scribe software for clinical note transcription with the patient, who gave verbal consent to proceed.  History of Present Illness   Andrea [name] is a 43 y.o. female here for follow up for psoriatic arthritis and fibromyalgia syndrome on SSZ 1000 mg BID, meloxicam 15 mg daily, and duloxetine recently increased to 60 mg daily/.  She experiences joint pain and flare-ups affecting her fingers, hands, arms, knees, and elbows. The pain varies from mild to severe, described as deep and difficult to reach, with associated stiffness and swelling in her hands. Flare-ups occur unpredictably, sometimes upon waking or during the day, and can be debilitating, causing her to be bedridden for two days at a time. During flare-ups, the pain is burning and constant, with a pain scale of 3 to 4 on better days.  She uses Tylenol Arthritis as needed but prefers to avoid medication unless necessary. Hot water baths and heating pads provide some relief. Recently, she started taking Cymbalta but has not noticed a significant difference in her symptoms yet.  She resigned from her job in September due to the closure of her workplace, hoping that reducing her workload might alleviate her symptoms. Initially, she noticed some improvement, but the flare-ups continued unpredictably. She wants to return to work as her workplace may reopen soon, but her symptoms are a barrier.  She experienced a viral illness recently, characterized by vomiting and nausea, which affected her and her children for about a day. She suspects it was a norovirus, given its prevalence in the area.    Previous HPI 09/24/2023 Andrea [name] is a 43 y.o. female here for follow up for psoriatic  arthritis as well as chronic pain associated with osteoarthritis and fibromyalgia syndrome.  She feels the sulfasalazine has bays noticeable difference in her symptoms.  He still has pain on a daily basis but is not experiencing the severe stiffness difficulty moving her joints and long duration every morning before being able to move.  She has not experienced any significant side effects from the medication.  Still taking the meloxicam which is helpful.  Skin rashes are doing well in most places keep some persistent itching especially at the back of her scalp.   Previous HPI 06/16/2023 Andrea [name] is a 43 y.o. female here for follow up for ongoing joint pain and stiffness of multiple areas with osteoarthritis fibromyalgia syndrome and possible psoriatic disease inflammation.  Resuming the Cymbalta at 30 mg daily since previous visit feels like this is partially helpful but still only making a small impact in her overall joint pain and stiffness and severe fatigue.  She is also concerned about seeing more pronounced swelling in her fingers especially early morning.   Previous HPI 04/16/23 Andrea [name] is a 43 y.o. female here for follow up for joint pain in multiple areas with positive ANA and psoriasis.  Antibody testing at initial visit was entirely unremarkable.  X-ray of bilateral hands did not show significant extent of degenerative arthritis.  She still has joint pain and stiffness and intermittent swelling ongoing as before.  Currently taking meloxicam  and some Tylenol arthritis in the mornings which alleviates symptoms she notices a lot more swelling and stiffness without it.  Has rashes most often at the back of the neck and sometimes occurring on her elbows and inside the ears which she treats with lotion during active symptoms.   Previous HPI 03/18/23 Andrea [name] is a 43 y.o. female here for evaluation of joint pain in multiple areas worsening for about the past 6 or 7 months.  She has a  longstanding history of fatigue body aches and migraine headaches.  Initially developed severe migraines with some nonspecific MRI white matter changes identified at age 15.  She was found to have fibromyalgia syndrome characterized by pain and sensitivity that is been usually persistent with some varying intensity.  Fatigue has been a problem worse for about the past 5 to 6 years is not associated with any specific change in sleep pattern or other focal symptoms that she can recall.  Current issue with increased pain since around August of last year with a much more acute or severe painful sensation in multiple areas throughout her arms and legs gets a feeling like there is swelling or joint trying to come out of its socket.  There is not as much pain and sensitivity to touch versus weightbearing and range of motion.  She mostly does not notice a lot of visible joint swelling erythema or warmth occasionally notices swelling or the tendons standing out on the backs of her hands.  She has noticed new joint nodules developing on multiple finger joints.  She experiences numbness and tingling sensations in the tips of the fingers and the toes this is coming and going.  Not associated with particular time of day activity or position.  Right great toe numbness is more persistent.  Of note she did discontinue several medications that she felt were not improving symptoms much.  Also had some unintentional weight loss up to 70 pounds throughout last year earlier this year.    Review of Systems  Constitutional:  Positive for fatigue.  HENT:  Negative for mouth sores and mouth dryness.   Eyes:  Negative for dryness.  Respiratory:  Negative for shortness of breath.   Cardiovascular:  Negative for chest pain and palpitations.  Gastrointestinal:  Negative for blood in stool, constipation and diarrhea.  Endocrine: Negative for increased urination.  Genitourinary:  Negative for involuntary urination.  Musculoskeletal:   Positive for joint pain, gait problem, joint pain, joint swelling, myalgias, muscle weakness, morning stiffness, muscle tenderness and myalgias.  Skin:  Positive for hair loss. Negative for color change, rash and sensitivity to sunlight.  Allergic/Immunologic: Negative for susceptible to infections.  Neurological:  Negative for dizziness and headaches.  Hematological:  Negative for swollen glands.  Psychiatric/Behavioral:  Positive for sleep disturbance. Negative for depressed mood. The patient is not nervous/anxious.     PMFS History:  Patient Active Problem List   Diagnosis Date Noted   Psoriatic arthritis (HCC) 09/24/2023   High risk medication use 06/16/2023   Finger swelling 04/16/2023   Positive ANA (antinuclear antibody) 03/18/2023   Other psoriasis 03/18/2023   Cervical high risk human papillomavirus (HPV) DNA test positive 06/01/2021   Chronic bilateral low back pain with bilateral sciatica 10/29/2020   Migraine without aura and without status migrainosus, not intractable 09/23/2020   BMI 34.0-34.9,adult 09/23/2020   Needs flu shot 09/23/2020   Encounter for screening mammogram for malignant neoplasm of breast 09/23/2020   Syrinx of spinal cord (HCC) 04/02/2020  Weakness 04/02/2020   Morbid obesity (HCC) 04/06/2019   Cough 03/26/2019   Acute non-recurrent pansinusitis 03/10/2019   Severe episode of recurrent major depressive disorder, without psychotic features (HCC) 03/10/2019   Generalized joint pain 03/10/2019   Primary insomnia 03/10/2019   Screening for malignant neoplasm of cervix 10/25/2018   Knee pain 10/23/2018   Triceps tendinitis 10/23/2018   Fracture of proximal phalanx of thumb 10/23/2018   Other constipation 08/11/2018   Family hx of colon cancer 08/11/2018   Episode of moderate major depression (HCC) 07/20/2018   Fatigue 07/20/2018   Fecal impaction (HCC)    Obstipation 06/17/2018   Acute retention of urine 06/17/2018   Constipation    Colitis  06/16/2018   Depression 06/16/2018   Hypokalemia 06/16/2018   Leukocytosis 06/16/2018   Acute upper respiratory infection 05/20/2018   Urinary tract infection with hematuria 05/20/2018   Dysuria 05/20/2018   Vaginal candida 05/20/2018   Bilateral lower extremity edema 05/20/2018   Other symptoms and signs involving the nervous system 05/20/2018   Generalized anxiety disorder 05/20/2018   Encounter for general adult medical examination with abnormal findings 05/20/2018   Family history of breast cancer in mother 05/20/2018   Osteoarthritis of knee 02/28/2017   Common migraine with intractable migraine 01/02/2017   White matter 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 01/02/2017   Postoperative wound cellulitis 05/08/2016   S/P cesarean section 04/24/2016   Status post primary low transverse cesarean section 04/24/2016   Oligohydramnios 04/19/2016   Supervision of pregnancy with history of pre-term labor in first trimester 10/31/2015    Past Medical History:  Diagnosis Date   Abnormal finding on MRI of brain 01/02/2017   Anxiety and depression    see progress notes -sw   Common migraine with intractable migraine 01/02/2017   Fibromyalgia    Polycystic ovarian disease    Psoriasis    Rosacea    Seizure (HCC)    as child from trauma; no meds and no further seizures    Family History  Problem Relation Age of Onset   Cancer Mother    Breast cancer Mother        early 83's   Bone cancer Mother    Migraines Mother    Brain cancer Mother    Cancer Father    Colon cancer Father    Prostate cancer Father    Liver cancer Father    Migraines Sister    Brain cancer Maternal Grandmother    Migraines Maternal Grandmother    Breast cancer Maternal Grandmother        <50   Breast cancer Maternal Great-grandmother    Brain cancer Maternal Great-grandmother    Past Surgical History:  Procedure Laterality Date   CESAREAN SECTION N/A 04/24/2016   Procedure: CESAREAN SECTION;  Surgeon: Brock Bad, MD;  Location: [facility];  Service: Obstetrics;  Laterality: N/A;   CHOLECYSTECTOMY     TONSILLECTOMY     TONSILLECTOMY AND ADENOIDECTOMY     Social History   Social History Narrative   Lives at home w/ her children   Right-handed   Caffeine: 1 cup coffee per day   One story home   Immunization History  Administered Date(s) Administered   Influenza Inj Mdck Quad Pf 12/18/2018, 09/05/2020   Influenza,inj,Quad PF,6+ Mos 10/31/2015, 10/15/2017   Influenza-Unspecified 08/23/2021   Moderna SARS-COV2 Booster Vaccination 08/23/2021   PFIZER(Purple Top)SARS-COV-2 Vaccination 07/03/2020, 07/24/2020   Tdap 04/27/2016     Objective: Vital Signs: BP  115/74 (BP Location: Left Arm, Patient Position: Sitting, Cuff Size: Large)   Pulse 80   Resp 14   Ht 5\' 7"  (1.702 m)   Wt 177 lb (80.3 kg)   LMP 12/24/2023   BMI 27.72 kg/m    Physical Exam Cardiovascular:     Rate and Rhythm: Normal rate and regular rhythm.  Pulmonary:     Effort: Pulmonary effort is normal.     Breath sounds: Normal breath sounds.  Musculoskeletal:     Right lower leg: No edema.     Left lower leg: No edema.  Skin:    General: Skin is warm and dry.     Findings: No rash.  Neurological:     Mental Status: She is alert.  Psychiatric:        Mood and Affect: Mood normal.      Musculoskeletal Exam:  Shoulders full ROM no tenderness or swelling Elbows full ROM no tenderness or swelling Wrists full ROM no tenderness or swelling Fingers full ROM no tenderness or swelling Tenderness to pressure paraspinal muscles b/l upper back and low back, no radiation distally Knees full ROM no tenderness or swelling   Investigation: No additional findings.  Imaging: No results found.  Recent Labs: Lab Results  Component Value Date   WBC 6.5 09/24/2023   HGB 13.4 09/24/2023   PLT 235 09/24/2023   NA 140 09/24/2023   K 4.2 09/24/2023   CL 102 09/24/2023   CO2 28 09/24/2023   GLUCOSE 73  09/24/2023   BUN 11 09/24/2023   CREATININE 0.80 09/24/2023   BILITOT 0.3 09/24/2023   ALKPHOS 97 08/24/2021   AST 16 09/24/2023   ALT 15 09/24/2023   PROT 6.3 09/24/2023   ALBUMIN 4.3 08/24/2021   CALCIUM 9.5 09/24/2023   GFRAA 112 03/14/2020    Speciality Comments: No specialty comments available.  Procedures:  No procedures performed Allergies: Codeine, Hydrocodone, Metronidazole, and Morphine and codeine   Assessment / Plan:     Visit Diagnoses: Psoriatic arthritis (HCC) - Plan: Sedimentation rate, C-reactive protein, CK Unclear if current symptoms are related to fibromyalgia or inflammatory arthritis. Currently on sulfasalazine 1000 mg BID and meloxicam 15 mg daily. -Order blood work to check blood cell counts and inflammatory markers.  High risk medication use - Plan: CBC with Differential/Platelet, COMPLETE METABOLIC PANEL WITH GFR -Checking CBC and CMP medication monitoring on sulfasalazine and maintenance NSAIDs  Fibromyalgia Severe, deep pain in fingers, hands, arms, knees, and elbows. Described as "flare-ups" with varying severity. Recently increased Cymbalta dose to two pills daily. No side effects noted. Discussed potential future use of gabapentin or Lyrica. -Continue Cymbalta at increased dose 60 mg daily. -Reevaluate in several weeks for potential addition of gabapentin or Lyrica.   Orders: Orders Placed This Encounter  Procedures   Sedimentation rate   C-reactive protein   CK   CBC with Differential/Platelet   COMPLETE METABOLIC PANEL WITH GFR   No orders of the defined types were placed in this encounter.    Follow-Up Instructions: Return in about 3 months (around 03/31/2024) for PsA/FMS on SSZ f/u 3mos.   Fuller Plan, MD  Note - This record has been created using [employer].  Chart creation errors have been sought, but may not always  have been located. Such creation errors do not reflect on  the standard of medical care.

## 2024-01-03 LAB — COMPLETE METABOLIC PANEL WITH GFR
AG Ratio: 2.2 (calc) (ref 1.0–2.5)
ALT: 11 U/L (ref 6–29)
AST: 13 U/L (ref 10–30)
Albumin: 4.3 g/dL (ref 3.6–5.1)
Alkaline phosphatase (APISO): 49 U/L (ref 31–125)
BUN: 13 mg/dL (ref 7–25)
CO2: 28 mmol/L (ref 20–32)
Calcium: 9.9 mg/dL (ref 8.6–10.2)
Chloride: 105 mmol/L (ref 98–110)
Creat: 0.76 mg/dL (ref 0.50–0.99)
Globulin: 2 g/dL (ref 1.9–3.7)
Glucose, Bld: 83 mg/dL (ref 65–99)
Potassium: 4.4 mmol/L (ref 3.5–5.3)
Sodium: 139 mmol/L (ref 135–146)
Total Bilirubin: 0.3 mg/dL (ref 0.2–1.2)
Total Protein: 6.3 g/dL (ref 6.1–8.1)
eGFR: 101 mL/min/{1.73_m2} (ref >=60)

## 2024-01-03 LAB — CBC WITH DIFFERENTIAL/PLATELET
Absolute Lymphocytes: 1978 {cells}/uL (ref 850–3900)
Absolute Monocytes: 365 {cells}/uL (ref 200–950)
Basophils Absolute: 19 {cells}/uL (ref 0–200)
Basophils Relative: 0.3 %
Eosinophils Absolute: 82 {cells}/uL (ref 15–500)
Eosinophils Relative: 1.3 %
HCT: 37.5 % (ref 35.0–45.0)
Hemoglobin: 12.3 g/dL (ref 11.7–15.5)
MCH: 30.5 pg (ref 27.0–33.0)
MCHC: 32.8 g/dL (ref 32.0–36.0)
MCV: 93.1 fL (ref 80.0–100.0)
MPV: 10.2 fL (ref 7.5–12.5)
Monocytes Relative: 5.8 %
Neutro Abs: 3856 {cells}/uL (ref 1500–7800)
Neutrophils Relative %: 61.2 %
Platelets: 233 10*3/uL (ref 140–400)
RBC: 4.03 10*6/uL (ref 3.80–5.10)
RDW: 11.6 % (ref 11.0–15.0)
Total Lymphocyte: 31.4 %
WBC: 6.3 10*3/uL (ref 3.8–10.8)

## 2024-01-03 LAB — C-REACTIVE PROTEIN: CRP: <3 mg/L (ref <8.0)

## 2024-01-03 LAB — SEDIMENTATION RATE: Sed Rate: 9 mm/h (ref 0–20)

## 2024-01-03 LAB — CK: Total CK: 110 U/L (ref 20–239)

## 2024-01-06 ENCOUNTER — Encounter: Payer: Self-pay | Admitting: Internal Medicine

## 2024-01-06 ENCOUNTER — Telehealth: Payer: Self-pay | Admitting: Internal Medicine

## 2024-01-06 DIAGNOSIS — M2550 Pain in unspecified joint: Secondary | ICD-10-CM

## 2024-01-06 MED ORDER — DULOXETINE HCL 60 MG PO CPEP: 60.0000 mg | ORAL_CAPSULE | Freq: Every day | ORAL | 2 refills | Status: AC

## 2024-01-06 NOTE — Telephone Encounter (Signed)
 Patient called stating she is experiencing pain all over to the point she had difficulty getting out of bed yesterday, 01/05/24.  Patient states she also has psoriasis on her face which she has never had before.  Patient requested a return call.    Patient also stated Dr. Jeannetta increased her Cymbalta  medication, but [facility] doesn't have her prescription.

## 2024-01-29 ENCOUNTER — Other Ambulatory Visit: Payer: Self-pay | Admitting: Internal Medicine

## 2024-01-29 DIAGNOSIS — M130 Polyarthritis, unspecified: Secondary | ICD-10-CM

## 2024-01-29 NOTE — Telephone Encounter (Signed)
 Last Fill: 09/24/2023  Labs: 01/02/2024 CBC and CMP WNL  Next Visit: 03/30/2024  Last Visit: 01/02/2024  DX: Psoriatic arthritis   Current Dose per office note 01/02/2024:  sulfasalazine 1000 mg BID   Okay to refill Sulfasalazine?

## 2024-03-18 NOTE — Progress Notes (Deleted)
 Office Visit Note  Patient: Andrea [name]             Date of Birth: Aug 14, 1981           MRN: [id number]             PCP: Shelby Dubin, FNP Referring: Shelby Dubin, FNP Visit Date: 03/30/2024   Subjective:  No chief complaint on file.   History of Present Illness: Andrea [name] is a 43 y.o. female here for follow up for psoriatic arthritis and fibromyalgia syndrome on SSZ 1000 mg BID, meloxicam 15 mg daily, and duloxetine recently increased to 60 mg daily.   Previous HPI 01/02/2024 Andrea [name] is a 43 y.o. female here for follow up for psoriatic arthritis and fibromyalgia syndrome on SSZ 1000 mg BID, meloxicam 15 mg daily, and duloxetine recently increased to 60 mg daily/.   She experiences joint pain and flare-ups affecting her fingers, hands, arms, knees, and elbows. The pain varies from mild to severe, described as deep and difficult to reach, with associated stiffness and swelling in her hands. Flare-ups occur unpredictably, sometimes upon waking or during the day, and can be debilitating, causing her to be bedridden for two days at a time. During flare-ups, the pain is burning and constant, with a pain scale of 3 to 4 on better days.   She uses Tylenol Arthritis as needed but prefers to avoid medication unless necessary. Hot water baths and heating pads provide some relief. Recently, she started taking Cymbalta but has not noticed a significant difference in her symptoms yet.   She resigned from her job in September due to the closure of her workplace, hoping that reducing her workload might alleviate her symptoms. Initially, she noticed some improvement, but the flare-ups continued unpredictably. She wants to return to work as her workplace may reopen soon, but her symptoms are a barrier.   She experienced a viral illness recently, characterized by vomiting and nausea, which affected her and her children for about a day. She suspects it was a norovirus, given its prevalence  in the area.      Previous HPI 09/24/2023 Andrea [name] is a 43 y.o. female here for follow up for psoriatic arthritis as well as chronic pain associated with osteoarthritis and fibromyalgia syndrome.  She feels the sulfasalazine has bays noticeable difference in her symptoms.  He still has pain on a daily basis but is not experiencing the severe stiffness difficulty moving her joints and long duration every morning before being able to move.  She has not experienced any significant side effects from the medication.  Still taking the meloxicam which is helpful.  Skin rashes are doing well in most places keep some persistent itching especially at the back of her scalp.   Previous HPI 06/16/2023 Andrea [name] is a 43 y.o. female here for follow up for ongoing joint pain and stiffness of multiple areas with osteoarthritis fibromyalgia syndrome and possible psoriatic disease inflammation.  Resuming the Cymbalta at 30 mg daily since previous visit feels like this is partially helpful but still only making a small impact in her overall joint pain and stiffness and severe fatigue.  She is also concerned about seeing more pronounced swelling in her fingers especially early morning.   Previous HPI 04/16/23 Andrea [name] is a 43 y.o. female here for follow up for joint pain in multiple areas with positive ANA and psoriasis.  Antibody testing at initial visit was entirely unremarkable.  X-ray of bilateral hands  did not show significant extent of degenerative arthritis.  She still has joint pain and stiffness and intermittent swelling ongoing as before.  Currently taking meloxicam and some Tylenol arthritis in the mornings which alleviates symptoms she notices a lot more swelling and stiffness without it.  Has rashes most often at the back of the neck and sometimes occurring on her elbows and inside the ears which she treats with lotion during active symptoms.   Previous HPI 03/18/23 Andrea [name] is a 43  y.o. female here for evaluation of joint pain in multiple areas worsening for about the past 6 or 7 months.  She has a longstanding history of fatigue body aches and migraine headaches.  Initially developed severe migraines with some nonspecific MRI white matter changes identified at age 85.  She was found to have fibromyalgia syndrome characterized by pain and sensitivity that is been usually persistent with some varying intensity.  Fatigue has been a problem worse for about the past 5 to 6 years is not associated with any specific change in sleep pattern or other focal symptoms that she can recall.  Current issue with increased pain since around August of last year with a much more acute or severe painful sensation in multiple areas throughout her arms and legs gets a feeling like there is swelling or joint trying to come out of its socket.  There is not as much pain and sensitivity to touch versus weightbearing and range of motion.  She mostly does not notice a lot of visible joint swelling erythema or warmth occasionally notices swelling or the tendons standing out on the backs of her hands.  She has noticed new joint nodules developing on multiple finger joints.  She experiences numbness and tingling sensations in the tips of the fingers and the toes this is coming and going.  Not associated with particular time of day activity or position.  Right great toe numbness is more persistent.  Of note she did discontinue several medications that she felt were not improving symptoms much.  Also had some unintentional weight loss up to 70 pounds throughout last year earlier this year.    No Rheumatology ROS completed.   PMFS History:  Patient Active Problem List   Diagnosis Date Noted   Psoriatic arthritis (HCC) 09/24/2023   High risk medication use 06/16/2023   Finger swelling 04/16/2023   Positive ANA (antinuclear antibody) 03/18/2023   Other psoriasis 03/18/2023   Cervical high risk human papillomavirus  (HPV) DNA test positive 06/01/2021   Chronic bilateral low back pain with bilateral sciatica 10/29/2020   Migraine without aura and without status migrainosus, not intractable 09/23/2020   BMI 34.0-34.9,adult 09/23/2020   Needs flu shot 09/23/2020   Encounter for screening mammogram for malignant neoplasm of breast 09/23/2020   Syrinx of spinal cord (HCC) 04/02/2020   Weakness 04/02/2020   Morbid obesity (HCC) 04/06/2019   Cough 03/26/2019   Acute non-recurrent pansinusitis 03/10/2019   Severe episode of recurrent major depressive disorder, without psychotic features (HCC) 03/10/2019   Generalized joint pain 03/10/2019   Primary insomnia 03/10/2019   Screening for malignant neoplasm of cervix 10/25/2018   Knee pain 10/23/2018   Triceps tendinitis 10/23/2018   Fracture of proximal phalanx of thumb 10/23/2018   Other constipation 08/11/2018   Family hx of colon cancer 08/11/2018   Episode of moderate major depression (HCC) 07/20/2018   Fatigue 07/20/2018   Fecal impaction (HCC)    Obstipation 06/17/2018   Acute retention of urine 06/17/2018  Constipation    Colitis 06/16/2018   Depression 06/16/2018   Hypokalemia 06/16/2018   Leukocytosis 06/16/2018   Acute upper respiratory infection 05/20/2018   Urinary tract infection with hematuria 05/20/2018   Dysuria 05/20/2018   Vaginal candida 05/20/2018   Bilateral lower extremity edema 05/20/2018   Other symptoms and signs involving the nervous system 05/20/2018   Generalized anxiety disorder 05/20/2018   Encounter for general adult medical examination with abnormal findings 05/20/2018   Family history of breast cancer in mother 05/20/2018   Osteoarthritis of knee 02/28/2017   Common migraine with intractable migraine 01/02/2017   White matter 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 01/02/2017   Postoperative wound cellulitis 05/08/2016   S/P cesarean section 04/24/2016   Status post primary low transverse cesarean section 04/24/2016    Oligohydramnios 04/19/2016   Supervision of pregnancy with history of pre-term labor in first trimester 10/31/2015    Past Medical History:  Diagnosis Date   Abnormal finding on MRI of brain 01/02/2017   Anxiety and depression    see progress notes -sw   Common migraine with intractable migraine 01/02/2017   Fibromyalgia    Polycystic ovarian disease    Psoriasis    Rosacea    Seizure (HCC)    as child from trauma; no meds and no further seizures    Family History  Problem Relation Age of Onset   Cancer Mother    Breast cancer Mother        early 4's   Bone cancer Mother    Migraines Mother    Brain cancer Mother    Cancer Father    Colon cancer Father    Prostate cancer Father    Liver cancer Father    Migraines Sister    Brain cancer Maternal Grandmother    Migraines Maternal Grandmother    Breast cancer Maternal Grandmother        <50   Breast cancer Maternal Great-grandmother    Brain cancer Maternal Great-grandmother    Past Surgical History:  Procedure Laterality Date   CESAREAN SECTION N/A 04/24/2016   Procedure: CESAREAN SECTION;  Surgeon: Gabrielle Joiner, MD;  Location: [facility];  Service: Obstetrics;  Laterality: N/A;   CHOLECYSTECTOMY     TONSILLECTOMY     TONSILLECTOMY AND ADENOIDECTOMY     Social History   Social History Narrative   Lives at home w/ her children   Right-handed   Caffeine: 1 cup coffee per day   One story home   Immunization History  Administered Date(s) Administered   Influenza Inj Mdck Quad Pf 12/18/2018, 09/05/2020   Influenza,inj,Quad PF,6+ Mos 10/31/2015, 10/15/2017   Influenza-Unspecified 08/23/2021   Moderna SARS-COV2 Booster Vaccination 08/23/2021   PFIZER(Purple Top)SARS-COV-2 Vaccination 07/03/2020, 07/24/2020   Tdap 04/27/2016     Objective: Vital Signs: There were no vitals taken for this visit.   Physical Exam   Musculoskeletal Exam: ***  CDAI Exam: CDAI Score: -- Patient Global: --; Provider  Global: -- Swollen: --; Tender: -- Joint Exam 03/30/2024   No joint exam has been documented for this visit   There is currently no information documented on the homunculus. Go to the Rheumatology activity and complete the homunculus joint exam.  Investigation: No additional findings.  Imaging: No results found.  Recent Labs: Lab Results  Component Value Date   WBC 6.3 01/02/2024   HGB 12.3 01/02/2024   PLT 233 01/02/2024   NA 139 01/02/2024   K 4.4 01/02/2024   CL 105  01/02/2024   CO2 28 01/02/2024   GLUCOSE 83 01/02/2024   BUN 13 01/02/2024   CREATININE 0.76 01/02/2024   BILITOT 0.3 01/02/2024   ALKPHOS 97 08/24/2021   AST 13 01/02/2024   ALT 11 01/02/2024   PROT 6.3 01/02/2024   ALBUMIN 4.3 08/24/2021   CALCIUM 9.9 01/02/2024   GFRAA 112 03/14/2020    Speciality Comments: No specialty comments available.  Procedures:  No procedures performed Allergies: Codeine, Hydrocodone, Metronidazole, and Morphine and codeine   Assessment / Plan:     Visit Diagnoses: No diagnosis found.  ***  Orders: No orders of the defined types were placed in this encounter.  No orders of the defined types were placed in this encounter.    Follow-Up Instructions: No follow-ups on file.   Glena Landau, RT  Note - This record has been created using [employer].  Chart creation errors have been sought, but may not always  have been located. Such creation errors do not reflect on  the standard of medical care.

## 2024-03-30 ENCOUNTER — Ambulatory Visit: Payer: Medicaid Other | Admitting: Internal Medicine

## 2024-03-30 DIAGNOSIS — Z79899 Other long term (current) drug therapy: Secondary | ICD-10-CM

## 2024-03-30 DIAGNOSIS — L4050 Arthropathic psoriasis, unspecified: Secondary | ICD-10-CM

## 2024-03-30 DIAGNOSIS — M797 Fibromyalgia: Secondary | ICD-10-CM
